# Patient Record
Sex: Female | Born: 1970
Health system: Southern US, Community
[De-identification: ages and names within clinical notes are randomized; demographics above are authoritative.]

## PROBLEM LIST (undated history)

## (undated) DIAGNOSIS — F419 Anxiety disorder, unspecified: Secondary | ICD-10-CM

## (undated) DIAGNOSIS — E785 Hyperlipidemia, unspecified: Secondary | ICD-10-CM

## (undated) DIAGNOSIS — F32A Depression, unspecified: Secondary | ICD-10-CM

## (undated) DIAGNOSIS — E079 Disorder of thyroid, unspecified: Secondary | ICD-10-CM

## (undated) DIAGNOSIS — M797 Fibromyalgia: Secondary | ICD-10-CM

## (undated) DIAGNOSIS — M5126 Other intervertebral disc displacement, lumbar region: Secondary | ICD-10-CM

## (undated) DIAGNOSIS — L409 Psoriasis, unspecified: Secondary | ICD-10-CM

## (undated) DIAGNOSIS — E039 Hypothyroidism, unspecified: Secondary | ICD-10-CM

## (undated) DIAGNOSIS — G250 Essential tremor: Secondary | ICD-10-CM

## (undated) DIAGNOSIS — Z87442 Personal history of urinary calculi: Secondary | ICD-10-CM

## (undated) DIAGNOSIS — T7840XA Allergy, unspecified, initial encounter: Secondary | ICD-10-CM

## (undated) DIAGNOSIS — E119 Type 2 diabetes mellitus without complications: Secondary | ICD-10-CM

## (undated) DIAGNOSIS — N83209 Unspecified ovarian cyst, unspecified side: Secondary | ICD-10-CM

## (undated) DIAGNOSIS — I10 Essential (primary) hypertension: Secondary | ICD-10-CM

## (undated) DIAGNOSIS — E559 Vitamin D deficiency, unspecified: Secondary | ICD-10-CM

## (undated) DIAGNOSIS — T8859XA Other complications of anesthesia, initial encounter: Secondary | ICD-10-CM

## (undated) DIAGNOSIS — F329 Major depressive disorder, single episode, unspecified: Secondary | ICD-10-CM

## (undated) HISTORY — DX: Vitamin D deficiency, unspecified: E55.9

## (undated) HISTORY — DX: Depression, unspecified: F32.A

## (undated) HISTORY — DX: Major depressive disorder, single episode, unspecified: F32.9

## (undated) HISTORY — DX: Essential tremor: G25.0

## (undated) HISTORY — DX: Essential (primary) hypertension: I10

## (undated) HISTORY — DX: Other intervertebral disc displacement, lumbar region: M51.26

## (undated) HISTORY — DX: Anxiety disorder, unspecified: F41.9

## (undated) HISTORY — DX: Hyperlipidemia, unspecified: E78.5

## (undated) HISTORY — DX: Type 2 diabetes mellitus without complications: E11.9

## (undated) HISTORY — DX: Fibromyalgia: M79.7

## (undated) HISTORY — DX: Unspecified ovarian cyst, unspecified side: N83.209

## (undated) HISTORY — DX: Allergy, unspecified, initial encounter: T78.40XA

## (undated) HISTORY — DX: Disorder of thyroid, unspecified: E07.9

## (undated) HISTORY — DX: Psoriasis, unspecified: L40.9

## (undated) HISTORY — PX: TUBAL LIGATION: SHX77

## (undated) HISTORY — PX: OTHER SURGICAL HISTORY: SHX169

---

## 2014-04-02 DIAGNOSIS — Z23 Encounter for immunization: Secondary | ICD-10-CM | POA: Diagnosis not present

## 2014-12-12 DIAGNOSIS — E039 Hypothyroidism, unspecified: Secondary | ICD-10-CM | POA: Diagnosis not present

## 2015-01-11 DIAGNOSIS — E039 Hypothyroidism, unspecified: Secondary | ICD-10-CM | POA: Diagnosis not present

## 2015-04-20 ENCOUNTER — Encounter: Payer: Self-pay | Admitting: Family Medicine

## 2015-04-20 ENCOUNTER — Ambulatory Visit (INDEPENDENT_AMBULATORY_CARE_PROVIDER_SITE_OTHER): Payer: Medicare Other | Admitting: Family Medicine

## 2015-04-20 VITALS — BP 114/82 | HR 84 | Temp 98.5°F | Resp 16 | Ht 61.5 in | Wt 163.7 lb

## 2015-04-20 DIAGNOSIS — E039 Hypothyroidism, unspecified: Secondary | ICD-10-CM | POA: Diagnosis not present

## 2015-04-20 DIAGNOSIS — Z800 Family history of malignant neoplasm of digestive organs: Secondary | ICD-10-CM

## 2015-04-20 DIAGNOSIS — R7303 Prediabetes: Secondary | ICD-10-CM | POA: Diagnosis not present

## 2015-04-20 DIAGNOSIS — R7301 Impaired fasting glucose: Secondary | ICD-10-CM | POA: Diagnosis not present

## 2015-04-20 DIAGNOSIS — E785 Hyperlipidemia, unspecified: Secondary | ICD-10-CM | POA: Insufficient documentation

## 2015-04-20 DIAGNOSIS — M797 Fibromyalgia: Secondary | ICD-10-CM | POA: Diagnosis not present

## 2015-04-20 DIAGNOSIS — L42 Pityriasis rosea: Secondary | ICD-10-CM | POA: Diagnosis not present

## 2015-04-20 DIAGNOSIS — Z23 Encounter for immunization: Secondary | ICD-10-CM | POA: Diagnosis not present

## 2015-04-20 HISTORY — DX: Encounter for immunization: Z23

## 2015-04-20 MED ORDER — LEVOTHYROXINE SODIUM 125 MCG PO TABS: 125.0000 ug | ORAL_TABLET | Freq: Every day | ORAL | Status: DC

## 2015-04-20 NOTE — Progress Notes (Signed)
Name: Tracy Stephens   MRN: [id number]    DOB: 12/01/1970   Date:04/20/2015       Progress Note  Subjective  Chief Complaint  Chief Complaint  Patient presents with  . Establish Care  . Labs Only    patient was told that she was borderline diabetic and that she had elevated cholesterol.   . Medication Refill    levothyroxine 125mcg    HPI  Tracy Stephens is a 44 y.o. female here today to transition care of medical needs to a primary care provider. She reports a history of Fibromyalgia and resting right sided tremor and LE instability which has granted her disability status at a young age. She also has hypothyroidism since her late 1820s and has a strong family propensity for this. Last TSH check was 3 months ago. Has been on Levothyroxine 125 mcg one a day for many years. Last blood work in general done 2 years ago and she was told she was pre diabetic and had high cholesterol. Implemented lifestyle changes, especially diet changes such as avoiding sugars and sodas. Not exercising routinely. Otherwise she reports plaque psoriasis for which she prefers to not take prescription medications at this time. She did however start having hypopigmented spots on her skin starting this Summer. Is pruritic.     Past Medical History  Diagnosis Date  . Fibromyalgia   . Thyroid disease   . Benign essential tremor     right side  . Herniated lumbar intervertebral disc   . Psoriasis   . Anxiety   . Depression   . Hypertension     history of blood pressure    Patient Active Problem List   Diagnosis Date Noted  . Hypothyroidism, adult 04/20/2015  . Fibromyalgia 04/20/2015  . Hyperlipidemia LDL goal <100 04/20/2015  . Pre-diabetes 04/20/2015  . Family history of colon cancer in mother 04/20/2015  . Elevated fasting glucose 04/20/2015    Social History  Substance Use Topics  . Smoking status: Never Smoker   . Smokeless tobacco: Not on file  . Alcohol Use: No     Current outpatient  prescriptions:  .  levothyroxine (SYNTHROID, LEVOTHROID) 125 MCG tablet, Take 1 tablet (125 mcg total) by mouth daily before breakfast., Disp: 90 tablet, Rfl: 1  Past Surgical History  Procedure Laterality Date  . Cesarean section      2    Family History  Problem Relation Age of Onset  . Cancer Mother     lung  . Alzheimer's disease Father   . Dementia Father   . Parkinson's disease Father   . Drug abuse Sister   . Thyroid disease Sister   . Thyroid disease Sister   . Thyroid disease Sister   . Thyroid disease Sister     Allergies  Allergen Reactions  . Lyrica [Pregabalin] Other (See Comments)    Patient reports of facial tingling     Review of Systems  CONSTITUTIONAL: No significant weight changes, fever, chills, weakness or fatigue.  HEENT:  - Eyes: No visual changes.  - Ears: No auditory changes. No pain.  - Nose: No sneezing, congestion, runny nose. - Throat: No sore throat. No changes in swallowing. SKIN: No rash or itching.  CARDIOVASCULAR: No chest pain, chest pressure or chest discomfort. No palpitations or edema.  RESPIRATORY: No shortness of breath, cough or sputum.  GASTROINTESTINAL: No anorexia, nausea, vomiting. No changes in bowel habits. No abdominal pain or blood.  GENITOURINARY: No dysuria. No frequency. No  discharge.  NEUROLOGICAL: No headache, dizziness, syncope, paralysis, ataxia, numbness or tingling in the extremities. No memory changes. No change in bowel or bladder control.  MUSCULOSKELETAL: No joint pain. Chronic muscle pain. HEMATOLOGIC: No anemia, bleeding or bruising.  LYMPHATICS: No enlarged lymph nodes.  PSYCHIATRIC: No change in mood. No change in sleep pattern.  ENDOCRINOLOGIC: No reports of sweating, cold or heat intolerance. No polyuria or polydipsia.     Objective  BP 114/82 mmHg  Pulse 84  Temp(Src) 98.5 F (36.9 C) (Oral)  Resp 16  Ht 5' 1.5" (1.562 m)  Wt 163 lb 11.2 oz (74.254 kg)  BMI 30.43 kg/m2  SpO2 97%  LMP  03/30/2015 (Approximate) Body mass index is 30.43 kg/(m^2).  Physical Exam  Constitutional: Patient is overweight and well-nourished. In no distress.  HEENT:  - Head: Normocephalic and atraumatic.  - Ears: Bilateral TMs gray, no erythema or effusion - Nose: Nasal mucosa moist - Mouth/Throat: Oropharynx is clear and moist. No tonsillar hypertrophy or erythema. No post nasal drainage.  - Eyes: Conjunctivae clear, EOM movements normal. PERRLA. No scleral icterus.  Neck: Normal range of motion. Neck supple. No JVD present. No thyromegaly present.  Cardiovascular: Normal rate, regular rhythm and normal heart sounds.  No murmur heard.  Pulmonary/Chest: Effort normal and breath sounds normal. No respiratory distress. Musculoskeletal: Normal range of motion bilateral UE and LE, no joint effusions. Peripheral vascular: Bilateral LE no edema. Neurological: CN II-XII grossly intact with no focal deficits. Alert and oriented to person, place, and time. Coordination, balance, strength, speech and gait are normal.  Skin: Skin is warm. Scaly raised plaque at scalp but not beyond front hairline, dry patches at left UE, scattered hypopigmented circular skin changes on upper body.  Psychiatric: Patient has a normal mood and affect. Behavior is normal in office today. Judgment and thought content normal in office today.   Assessment & Plan   1. Hypothyroidism, adult Lab work. Refilled levothyroxine 125 mcg one a day.  - CBC with Differential/Platelet - Comprehensive metabolic panel - Lipid panel - TSH - T3, free - T4, free - levothyroxine (SYNTHROID, LEVOTHROID) 125 MCG tablet; Take 1 tablet (125 mcg total) by mouth daily before breakfast.  Dispense: 90 tablet; Refill: 1  2. Fibromyalgia Stable.  - CBC with Differential/Platelet - Comprehensive metabolic panel - Lipid panel - TSH - T3, free - T4, free  3. Hyperlipidemia LDL goal <100 Recheck FLP. Recommend statin initiation if still  elevated.  - CBC with Differential/Platelet - Comprehensive metabolic panel - Lipid panel - TSH - T3, free - T4, free  4. Pre-diabetes Blood work.  - CBC with Differential/Platelet - Comprehensive metabolic panel - Lipid panel - TSH - T3, free - T4, free  5. Family history of colon cancer in mother Advised patient that she should have had a C-scope at age 35 at least since her mother had Colon Cancer at age 21. She will f/u for a CPE and I will order Mammogram, PAP and C-scope.  - CBC with Differential/Platelet - Comprehensive metabolic panel - Lipid panel - TSH - T3, free - T4, free  6. Need for immunization against influenza Done  - Flu Vaccine QUAD 36+ mos PF IM (Fluarix & Fluzone Quad PF)  7. Elevated fasting glucose Check for diabetes.   - Hemoglobin A1c  8. Pityriasis rosea Likely skin diagnosis. Will review blood work and suggest treatment near future.

## 2015-04-20 NOTE — Patient Instructions (Signed)
Diabetes Mellitus and Food It is important for you to manage your blood sugar (glucose) level. Your blood glucose level can be greatly affected by what you eat. Eating healthier foods in the appropriate amounts throughout the day at about the same time each day will help you control your blood glucose level. It can also help slow or prevent worsening of your diabetes mellitus. Healthy eating may even help you improve the level of your blood pressure and reach or maintain a healthy weight.  General recommendations for healthful eating and cooking habits include:  Eating meals and snacks regularly. Avoid going long periods of time without eating to lose weight.  Eating a diet that consists mainly of plant-based foods, such as fruits, vegetables, nuts, legumes, and whole grains.  Using low-heat cooking methods, such as baking, instead of high-heat cooking methods, such as deep frying. Work with your dietitian to make sure you understand how to use the Nutrition Facts information on food labels. HOW CAN FOOD AFFECT ME? Carbohydrates Carbohydrates affect your blood glucose level more than any other type of food. Your dietitian will help you determine how many carbohydrates to eat at each meal and teach you how to count carbohydrates. Counting carbohydrates is important to keep your blood glucose at a healthy level, especially if you are using insulin or taking certain medicines for diabetes mellitus. Alcohol Alcohol can cause sudden decreases in blood glucose (hypoglycemia), especially if you use insulin or take certain medicines for diabetes mellitus. Hypoglycemia can be a life-threatening condition. Symptoms of hypoglycemia (sleepiness, dizziness, and disorientation) are similar to symptoms of having too much alcohol.  If your health care provider has given you approval to drink alcohol, do so in moderation and use the following guidelines:  Women should not have more than one drink per day, and men  should not have more than two drinks per day. One drink is equal to:  12 oz of beer.  5 oz of wine.  1 oz of hard liquor.  Do not drink on an empty stomach.  Keep yourself hydrated. Have water, diet soda, or unsweetened iced tea.  Regular soda, juice, and other mixers might contain a lot of carbohydrates and should be counted. WHAT FOODS ARE NOT RECOMMENDED? As you make food choices, it is important to remember that all foods are not the same. Some foods have fewer nutrients per serving than other foods, even though they might have the same number of calories or carbohydrates. It is difficult to get your body what it needs when you eat foods with fewer nutrients. Examples of foods that you should avoid that are high in calories and carbohydrates but low in nutrients include:  Trans fats (most processed foods list trans fats on the Nutrition Facts label).  Regular soda.  Juice.  Candy.  Sweets, such as cake, pie, doughnuts, and cookies.  Fried foods. WHAT FOODS CAN I EAT? Eat nutrient-rich foods, which will nourish your body and keep you healthy. The food you should eat also will depend on several factors, including:  The calories you need.  The medicines you take.  Your weight.  Your blood glucose level.  Your blood pressure level.  Your cholesterol level. You should eat a variety of foods, including:  Protein.  Lean cuts of meat.  Proteins low in saturated fats, such as fish, egg whites, and beans. Avoid processed meats.  Fruits and vegetables.  Fruits and vegetables that may help control blood glucose levels, such as apples, mangoes, and   yams.  Dairy products.  Choose fat-free or low-fat dairy products, such as milk, yogurt, and cheese.  Grains, bread, pasta, and rice.  Choose whole grain products, such as multigrain bread, whole oats, and brown rice. These foods may help control blood pressure.  Fats.  Foods containing healthful fats, such as nuts,  avocado, olive oil, canola oil, and fish. DOES EVERYONE WITH DIABETES MELLITUS HAVE THE SAME MEAL PLAN? Because every person with diabetes mellitus is different, there is not one meal plan that works for everyone. It is very important that you meet with a dietitian who will help you create a meal plan that is just right for you.   This information is not intended to replace advice given to you by your health care provider. Make sure you discuss any questions you have with your health care provider.   Document Released: 03/07/2005 Document Revised: 07/01/2014 Document Reviewed: 05/07/2013 Elsevier Interactive Patient Education 2016 [employer]  

## 2015-11-01 ENCOUNTER — Ambulatory Visit (INDEPENDENT_AMBULATORY_CARE_PROVIDER_SITE_OTHER): Payer: Medicare Other | Admitting: Family Medicine

## 2015-11-01 ENCOUNTER — Encounter: Payer: Self-pay | Admitting: Family Medicine

## 2015-11-01 VITALS — BP 124/80 | HR 88 | Temp 98.5°F | Resp 14 | Wt 164.5 lb

## 2015-11-01 DIAGNOSIS — E039 Hypothyroidism, unspecified: Secondary | ICD-10-CM

## 2015-11-01 DIAGNOSIS — E785 Hyperlipidemia, unspecified: Secondary | ICD-10-CM | POA: Diagnosis not present

## 2015-11-01 DIAGNOSIS — R7303 Prediabetes: Secondary | ICD-10-CM

## 2015-11-01 DIAGNOSIS — R251 Tremor, unspecified: Secondary | ICD-10-CM | POA: Diagnosis not present

## 2015-11-01 DIAGNOSIS — E559 Vitamin D deficiency, unspecified: Secondary | ICD-10-CM

## 2015-11-01 DIAGNOSIS — E041 Nontoxic single thyroid nodule: Secondary | ICD-10-CM

## 2015-11-01 DIAGNOSIS — R5383 Other fatigue: Secondary | ICD-10-CM

## 2015-11-01 DIAGNOSIS — M797 Fibromyalgia: Secondary | ICD-10-CM

## 2015-11-01 HISTORY — DX: Vitamin D deficiency, unspecified: E55.9

## 2015-11-01 MED ORDER — TRAZODONE HCL 50 MG PO TABS: 25.0000 mg | ORAL_TABLET | Freq: Every evening | ORAL | Status: DC | PRN

## 2015-11-01 NOTE — Assessment & Plan Note (Signed)
Referring to neurologist

## 2015-11-01 NOTE — Progress Notes (Signed)
BP 124/80 mmHg  Pulse 88  Temp(Src) 98.5 F (36.9 C) (Oral)  Resp 14  Wt 164 lb 8 oz (74.617 kg)  SpO2 93%  LMP 10/31/2015 (Approximate)   Subjective:    Patient ID: Tracy Stephens, female    DOB: [birth year], 45 y.o.   MRN: [id number]  HPI: Tracy Stephens is a 45 y.o. female  Chief Complaint  Patient presents with  . Follow-up    6 month  . Tremors    now on left side  . Fatigue    no energy several weeks   She is on 125 mcg of levothyroxine; pretty stable over the last 10 years; fatigued; no fogginess; no constipation; no weight change; no hair loss Lab Results  Component Value Date   TSH 9.330* 11/01/2015   No hx of anemia; does have vit D deficiency  High cholesterol; borderline; has not taken medicine for that  Prediabetes; going on for about 2 years  Coughing for three days; bringing up phlegm; nonsmoker; not around smokers  Tremors; benign essential tremor; 10 years; diagnosed by neurologist; not a drinker; right hand is worst, but left hand just starting; not taking medicine; would like to see neurologist now; drinks caffeine, but has tried to only drink caffeine-free mostly; when she drinks Pepsi, not worse with caffeine, actually gets better  FIbromyalgia; cannot tolerate Cymbalta, put her sleep for 16 hours; has not tried trazodone; poor sleep quality; no snoring or sleep apnea; has bad back and sciatic nerve irritation  Depression screen [facility] 2/9 11/01/2015 04/20/2015  Decreased Interest 0 0  Down, Depressed, Hopeless 1 0  PHQ - 2 Score 1 0   Relevant past medical, surgical, family and social history reviewed Past Medical History  Diagnosis Date  . Fibromyalgia   . Thyroid disease   . Benign essential tremor     right side  . Herniated lumbar intervertebral disc   . Psoriasis   . Anxiety   . Depression   . Hypertension     history of blood pressure  . Vitamin D deficiency 11/01/2015  . Type 2 diabetes mellitus (HCC) 11/29/2015   Past Surgical  History  Procedure Laterality Date  . Cesarean section      2   Family History  Problem Relation Age of Onset  . Cancer Mother     lung  . Alzheimer's disease Father   . Dementia Father   . Parkinson's disease Father   . Drug abuse Sister   . Thyroid disease Sister   . Thyroid disease Sister   . Thyroid disease Sister   . Thyroid disease Sister    Social History  Substance Use Topics  . Smoking status: Never Smoker   . Smokeless tobacco: None  . Alcohol Use: No   Interim medical history since last visit reviewed. Allergies and medications reviewed  Review of Systems Per HPI unless specifically indicated above     Objective:    BP 124/80 mmHg  Pulse 88  Temp(Src) 98.5 F (36.9 C) (Oral)  Resp 14  Wt 164 lb 8 oz (74.617 kg)  SpO2 93%  LMP 10/31/2015 (Approximate)  Wt Readings from Last 3 Encounters:  11/29/15 162 lb (73.483 kg)  11/01/15 164 lb 8 oz (74.617 kg)  04/20/15 163 lb 11.2 oz (74.254 kg)    Physical Exam  Constitutional: She appears well-developed and well-nourished. No distress.  HENT:  Head: Normocephalic and atraumatic.  Eyes: EOM are normal. No scleral icterus.  Neck: No thyromegaly (  but there is some asymmtry, left side larger?) present.  Cardiovascular: Normal rate, regular rhythm and normal heart sounds.   No murmur heard. Pulmonary/Chest: Effort normal and breath sounds normal. No respiratory distress. She has no wheezes.  Abdominal: Soft. Bowel sounds are normal. She exhibits no distension.  Musculoskeletal: Normal range of motion. She exhibits no edema.  Neurological: She is alert. She displays tremor. She exhibits normal muscle tone.  No cogwheeling  Skin: Skin is warm and dry. She is not diaphoretic. No pallor.  Psychiatric: She has a normal mood and affect. Her behavior is normal. Judgment and thought content normal. Her mood appears not anxious. She does not exhibit a depressed mood.      Assessment & Plan:   Problem List Items  Addressed This Visit      Endocrine   Hypothyroidism, adult    Primary hypothyroidism for 17 years; very positive fam hx; check TSH and free T4 today and adjust medicine if needed      Relevant Orders   T4, free (Completed)   TSH (Completed)     Musculoskeletal and Integument   Fibromyalgia    Sleep importance reinforced; offered trazodone        Other   Hyperlipidemia LDL goal <100    Limit saturated fats; check fasting lipids today      Relevant Orders   Lipid Panel w/o Chol/HDL Ratio (Completed)   Pre-diabetes    Check A1c and glucose; limit white bread, white potatoes      Relevant Orders   Hemoglobin A1c (Completed)   Lipid Panel w/o Chol/HDL Ratio (Completed)   Tremors of nervous system - Primary    Referring to neurologist      Relevant Orders   Ambulatory referral to Neurology   Vitamin D deficiency   Relevant Orders   VITAMIN D 25 Hydroxy (Vit-D Deficiency, Fractures) (Completed)    Other Visit Diagnoses    Left thyroid nodule        Relevant Orders    T4, free (Completed)    TSH (Completed)    Other fatigue        Relevant Orders    CBC with Differential/Platelet (Completed)    Comprehensive metabolic panel (Completed)    VITAMIN D 25 Hydroxy (Vit-D Deficiency, Fractures) (Completed)    Vitamin B12 (Completed)     (thyroid ultrasound ordered at f/u visit)  Follow up plan: Return in about 4 weeks (around 11/29/2015) for follow-up.  An after-visit summary was printed and given to the patient at check-out.  Please see the patient instructions which may contain other information and recommendations beyond what is mentioned above in the assessment and plan.  Meds ordered this encounter  Medications  . DISCONTD: traZODone (DESYREL) 50 MG tablet    Sig: Take 0.5-1 tablets (25-50 mg total) by mouth at bedtime as needed for sleep.    Dispense:  30 tablet    Refill:  3   Orders Placed This Encounter  Procedures  . CBC with Differential/Platelet  .  Comprehensive metabolic panel  . Hemoglobin A1c  . Lipid Panel w/o Chol/HDL Ratio  . T4, free  . TSH  . VITAMIN D 25 Hydroxy (Vit-D Deficiency, Fractures)  . Vitamin B12  . Ambulatory referral to Neurology

## 2015-11-01 NOTE — Patient Instructions (Addendum)
We'll refer you to the neurologist We'll get labs today Return in 4 weeks for follow-up

## 2015-11-01 NOTE — Assessment & Plan Note (Signed)
Limit saturated fats; check fasting lipids today 

## 2015-11-01 NOTE — Assessment & Plan Note (Signed)
Check A1c and glucose; limit white bread, white potatoes

## 2015-11-01 NOTE — Assessment & Plan Note (Signed)
Sleep importance reinforced; offered trazodone

## 2015-11-01 NOTE — Assessment & Plan Note (Signed)
Primary hypothyroidism for 17 years; very positive fam hx; check TSH and free T4 today and adjust medicine if needed

## 2015-11-02 LAB — COMPREHENSIVE METABOLIC PANEL
A/G RATIO: 1.3 (ref 1.2–2.2)
ALBUMIN: 4.3 g/dL (ref 3.5–5.5)
ALK PHOS: 105 IU/L (ref 39–117)
ALT: 33 IU/L — ABNORMAL HIGH (ref 0–32)
AST: 40 IU/L (ref 0–40)
BILIRUBIN TOTAL: 0.3 mg/dL (ref 0.0–1.2)
BUN / CREAT RATIO: 10 (ref 9–23)
BUN: 8 mg/dL (ref 6–24)
CHLORIDE: 100 mmol/L (ref 96–106)
CO2: 21 mmol/L (ref 18–29)
Calcium: 9.8 mg/dL (ref 8.7–10.2)
Creatinine, Ser: 0.84 mg/dL (ref 0.57–1.00)
GFR calc Af Amer: 98 mL/min/{1.73_m2} (ref >59)
GFR calc non Af Amer: 85 mL/min/{1.73_m2} (ref >59)
Globulin, Total: 3.2 g/dL (ref 1.5–4.5)
Glucose: 196 mg/dL — ABNORMAL HIGH (ref 65–99)
Potassium: 4.9 mmol/L (ref 3.5–5.2)
SODIUM: 141 mmol/L (ref 134–144)
Total Protein: 7.5 g/dL (ref 6.0–8.5)

## 2015-11-02 LAB — LIPID PANEL W/O CHOL/HDL RATIO
Cholesterol, Total: 261 mg/dL — ABNORMAL HIGH (ref 100–199)
HDL: 43 mg/dL (ref >39)
LDL Calculated: 184 mg/dL — ABNORMAL HIGH (ref 0–99)
Triglycerides: 171 mg/dL — ABNORMAL HIGH (ref 0–149)
VLDL Cholesterol Cal: 34 mg/dL (ref 5–40)

## 2015-11-02 LAB — CBC WITH DIFFERENTIAL/PLATELET
BASOS ABS: 0 10*3/uL (ref 0.0–0.2)
Basos: 0 %
EOS (ABSOLUTE): 0.3 10*3/uL (ref 0.0–0.4)
Eos: 4 %
HEMATOCRIT: 43.8 % (ref 34.0–46.6)
HEMOGLOBIN: 14.9 g/dL (ref 11.1–15.9)
Immature Grans (Abs): 0 10*3/uL (ref 0.0–0.1)
Immature Granulocytes: 0 %
LYMPHS ABS: 1.1 10*3/uL (ref 0.7–3.1)
LYMPHS: 15 %
MCH: 30.9 pg (ref 26.6–33.0)
MCHC: 34 g/dL (ref 31.5–35.7)
MCV: 91 fL (ref 79–97)
Monocytes Absolute: 0.6 10*3/uL (ref 0.1–0.9)
Monocytes: 9 %
NEUTROS ABS: 5.1 10*3/uL (ref 1.4–7.0)
NEUTROS PCT: 72 %
Platelets: 343 10*3/uL (ref 150–379)
RBC: 4.82 x10E6/uL (ref 3.77–5.28)
RDW: 13 % (ref 12.3–15.4)
WBC: 7 10*3/uL (ref 3.4–10.8)

## 2015-11-02 LAB — TSH: TSH: 9.33 u[IU]/mL — AB (ref 0.450–4.500)

## 2015-11-02 LAB — HEMOGLOBIN A1C
Est. average glucose Bld gHb Est-mCnc: 206 mg/dL
HEMOGLOBIN A1C: 8.8 % — AB (ref 4.8–5.6)

## 2015-11-02 LAB — T4, FREE: FREE T4: 1.22 ng/dL (ref 0.82–1.77)

## 2015-11-02 LAB — VITAMIN D 25 HYDROXY (VIT D DEFICIENCY, FRACTURES): Vit D, 25-Hydroxy: 26.3 ng/mL — ABNORMAL LOW (ref 30.0–100.0)

## 2015-11-02 LAB — VITAMIN B12: VITAMIN B 12: 436 pg/mL (ref 211–946)

## 2015-11-07 ENCOUNTER — Telehealth: Payer: Self-pay

## 2015-11-07 NOTE — Telephone Encounter (Signed)
Patient called please review labs 

## 2015-11-09 MED ORDER — METFORMIN HCL ER 500 MG PO TB24: ORAL_TABLET | ORAL | Status: DC

## 2015-11-09 MED ORDER — LEVOTHYROXINE SODIUM 137 MCG PO TABS: 137.0000 ug | ORAL_TABLET | Freq: Every day | ORAL | Status: DC

## 2015-11-09 MED ORDER — TRAZODONE HCL 50 MG PO TABS: 50.0000 mg | ORAL_TABLET | Freq: Every evening | ORAL | Status: DC | PRN

## 2015-11-09 NOTE — Telephone Encounter (Signed)
I talked with patient about her labs earlier today A1C does in fact show diabetes; start medicine She has already done diabetic teaching she says Cholesterol too high; we'll address that at appt TSH shows dose needs adjusting; new Rx sent Start vit D 2000 iu; take 2 a day for 2 months, then one a day Appt in next 2-3 weeks about her diabetes She agrees

## 2015-11-09 NOTE — Telephone Encounter (Signed)
I spoke with patient; I'm in between patients; will document soon

## 2015-11-29 ENCOUNTER — Encounter: Payer: Self-pay | Admitting: Family Medicine

## 2015-11-29 ENCOUNTER — Ambulatory Visit (INDEPENDENT_AMBULATORY_CARE_PROVIDER_SITE_OTHER): Payer: Medicare Other | Admitting: Family Medicine

## 2015-11-29 VITALS — BP 122/74 | HR 80 | Temp 97.7°F | Resp 14 | Wt 162.0 lb

## 2015-11-29 DIAGNOSIS — E119 Type 2 diabetes mellitus without complications: Secondary | ICD-10-CM

## 2015-11-29 DIAGNOSIS — E039 Hypothyroidism, unspecified: Secondary | ICD-10-CM

## 2015-11-29 DIAGNOSIS — R251 Tremor, unspecified: Secondary | ICD-10-CM

## 2015-11-29 DIAGNOSIS — E118 Type 2 diabetes mellitus with unspecified complications: Secondary | ICD-10-CM | POA: Insufficient documentation

## 2015-11-29 DIAGNOSIS — E559 Vitamin D deficiency, unspecified: Secondary | ICD-10-CM

## 2015-11-29 DIAGNOSIS — E1165 Type 2 diabetes mellitus with hyperglycemia: Secondary | ICD-10-CM

## 2015-11-29 DIAGNOSIS — E785 Hyperlipidemia, unspecified: Secondary | ICD-10-CM

## 2015-11-29 DIAGNOSIS — E041 Nontoxic single thyroid nodule: Secondary | ICD-10-CM | POA: Diagnosis not present

## 2015-11-29 HISTORY — DX: Type 2 diabetes mellitus without complications: E11.9

## 2015-11-29 MED ORDER — TRAZODONE HCL 150 MG PO TABS
150.0000 mg | ORAL_TABLET | Freq: Every day | ORAL | Status: DC
Start: 2015-11-29 — End: 2016-07-08

## 2015-11-29 NOTE — Assessment & Plan Note (Signed)
Order thyroid US 

## 2015-11-29 NOTE — Assessment & Plan Note (Signed)
Going to neurologist end of July

## 2015-11-29 NOTE — Assessment & Plan Note (Signed)
On medicine, recheck TSH at f/u in August

## 2015-11-29 NOTE — Assessment & Plan Note (Signed)
Taking supplement.

## 2015-11-29 NOTE — Patient Instructions (Addendum)
Start vit D 2000 iu; take 2 a day for 2 months, then one a day  Please do see your eye doctor regularly, and have your eyes examined every year (or more often per his or her recommendation) Check your feet every night and let me know right away of any sores, infections, numbness, etc. Try to limit sweets, white bread, white rice, white potatoes It is okay with me for you to not check your fingerstick blood sugars (per [employer] Best Practices), unless you are interested and feel it would be helpful for you  Try to limit saturated fats in your diet (bologna, hot dogs, barbeque, cheeseburgers, hamburgers, steak, bacon, sausage, cheese, etc.) and get more fresh fruits, vegetables, and whole grains  I am so proud of the changes you have made -- keep up the good work!  I've ordered the ultrasound, so please call if you've not heard about the appointment in one week

## 2015-11-29 NOTE — Assessment & Plan Note (Addendum)
Foot exam today by MD; next A1c due on or after August 11th; already did the diabetic teaching

## 2015-11-29 NOTE — Progress Notes (Signed)
BP 122/74 mmHg  Pulse 80  Temp(Src) 97.7 F (36.5 C) (Oral)  Resp 14  Wt 162 lb (73.483 kg)  SpO2 97%  LMP 10/31/2015 (Approximate)   Subjective:    Patient ID: Tracy Stephens, female    DOB: [birth year], 45 y.o.   MRN: [id number]  HPI: Tracy Stephens is a 45 y.o. female  Chief Complaint  Patient presents with  . Medication Refill   Thyroid nodule, left; abnormal TSH and adjusted medicine  High cholesterol; daughter has high chol too but not on medicine, borderline We reviewed her lipids; she has changed her diet; smaller portions, drinking only water; adjusting her diet; no eggs; no milk; changed from white to wheat bread; she wants to see how she does without medicine first  Vitamin D deficiency; on the supplement 4,000 iu daily for now  She is taking the sleeping medicine, 3 at a time is working well for sleep; not hungover in the morning  Diabetes; gotten rid of white bread, no more sodas; lost 2 pounds; always moving  Depression screen [facility] 2/9 11/01/2015 04/20/2015  Decreased Interest 0 0  Down, Depressed, Hopeless 1 0  PHQ - 2 Score 1 0   Relevant past medical, surgical, family and social history reviewed Past Medical History  Diagnosis Date  . Fibromyalgia   . Thyroid disease   . Benign essential tremor     right side  . Herniated lumbar intervertebral disc   . Psoriasis   . Anxiety   . Depression   . Hypertension     history of blood pressure  . Vitamin D deficiency 11/01/2015  . Type 2 diabetes mellitus (HCC) 11/29/2015   Past Surgical History  Procedure Laterality Date  . Cesarean section      2   Family History  Problem Relation Age of Onset  . Cancer Mother     lung  . Alzheimer's disease Father   . Dementia Father   . Parkinson's disease Father   . Drug abuse Sister   . Thyroid disease Sister   . Thyroid disease Sister   . Thyroid disease Sister   . Thyroid disease Sister    Social History  Substance Use Topics  . Smoking status:  Never Smoker   . Smokeless tobacco: None  . Alcohol Use: No   Interim medical history since last visit reviewed. Allergies and medications reviewed  Review of Systems Per HPI unless specifically indicated above     Objective:    BP 122/74 mmHg  Pulse 80  Temp(Src) 97.7 F (36.5 C) (Oral)  Resp 14  Wt 162 lb (73.483 kg)  SpO2 97%  LMP 10/31/2015 (Approximate)  Wt Readings from Last 3 Encounters:  11/29/15 162 lb (73.483 kg)  11/01/15 164 lb 8 oz (74.617 kg)  04/20/15 163 lb 11.2 oz (74.254 kg)   body mass index is 30.12 kg/(m^2).  Physical Exam  Constitutional: She appears well-developed and well-nourished. No distress.  Obese  Eyes: EOM are normal. No scleral icterus.  Neck: No thyromegaly (but there is some asymmtry, left side larger?) present.  Cardiovascular: Normal rate, regular rhythm and normal heart sounds.   No murmur heard. Pulmonary/Chest: Effort normal and breath sounds normal. No respiratory distress. She has no wheezes.  Abdominal: Soft. Bowel sounds are normal. She exhibits no distension.  Musculoskeletal: Normal range of motion. She exhibits no edema.  Neurological: She is alert. She displays tremor. She exhibits normal muscle tone.  No cogwheeling  Skin: Skin is warm  and dry. She is not diaphoretic. No pallor.  Psychiatric: She has a normal mood and affect. Her behavior is normal. Judgment and thought content normal. Her mood appears not anxious. She does not exhibit a depressed mood.   Diabetic Foot Form - Detailed   Diabetic Foot Exam - detailed  Diabetic Foot exam was performed with the following findings:  Yes 11/29/2015  1:14 PM  Visual Foot Exam completed.:  Yes  Are the toenails ingrown?:  No  Normal Range of Motion:  Yes    Pulse Foot Exam completed.:  Yes  Right Dorsalis Pedis:  Present Left Dorsalis Pedis:  Present  Sensory Foot Exam Completed.:  Yes  Swelling:  No  Semmes-Weinstein Monofilament Test  R Site 1-Great Toe:  Pos L Site  1-Great Toe:  Pos  R Site 4:  Pos L Site 4:  Pos  R Site 5:  Pos L Site 5:  Pos        Results for orders placed or performed in visit on 11/01/15  CBC with Differential/Platelet  Result Value Ref Range   WBC 7.0 3.4 - 10.8 x10E3/uL   RBC 4.82 3.77 - 5.28 x10E6/uL   Hemoglobin 14.9 11.1 - 15.9 g/dL   Hematocrit 16.143.8 09.634.0 - 46.6 %   MCV 91 79 - 97 fL   MCH 30.9 26.6 - 33.0 pg   MCHC 34.0 31.5 - 35.7 g/dL   RDW 04.513.0 40.912.3 - 81.115.4 %   Platelets 343 150 - 379 x10E3/uL   Neutrophils 72 %   Lymphs 15 %   Monocytes 9 %   Eos 4 %   Basos 0 %   Neutrophils Absolute 5.1 1.4 - 7.0 x10E3/uL   Lymphocytes Absolute 1.1 0.7 - 3.1 x10E3/uL   Monocytes Absolute 0.6 0.1 - 0.9 x10E3/uL   EOS (ABSOLUTE) 0.3 0.0 - 0.4 x10E3/uL   Basophils Absolute 0.0 0.0 - 0.2 x10E3/uL   Immature Granulocytes 0 %   Immature Grans (Abs) 0.0 0.0 - 0.1 x10E3/uL  Comprehensive metabolic panel  Result Value Ref Range   Glucose 196 (H) 65 - 99 mg/dL   BUN 8 6 - 24 mg/dL   Creatinine, Ser 9.140.84 0.57 - 1.00 mg/dL   GFR calc non Af Amer 85 >59 mL/min/1.73   GFR calc Af Amer 98 >59 mL/min/1.73   BUN/Creatinine Ratio 10 9 - 23   Sodium 141 134 - 144 mmol/L   Potassium 4.9 3.5 - 5.2 mmol/L   Chloride 100 96 - 106 mmol/L   CO2 21 18 - 29 mmol/L   Calcium 9.8 8.7 - 10.2 mg/dL   Total Protein 7.5 6.0 - 8.5 g/dL   Albumin 4.3 3.5 - 5.5 g/dL   Globulin, Total 3.2 1.5 - 4.5 g/dL   Albumin/Globulin Ratio 1.3 1.2 - 2.2   Bilirubin Total 0.3 0.0 - 1.2 mg/dL   Alkaline Phosphatase 105 39 - 117 IU/L   AST 40 0 - 40 IU/L   ALT 33 (H) 0 - 32 IU/L  Hemoglobin A1c  Result Value Ref Range   Hgb A1c MFr Bld 8.8 (H) 4.8 - 5.6 %   Est. average glucose Bld gHb Est-mCnc 206 mg/dL  Lipid Panel w/o Chol/HDL Ratio  Result Value Ref Range   Cholesterol, Total 261 (H) 100 - 199 mg/dL   Triglycerides 782171 (H) 0 - 149 mg/dL   HDL 43 >95>39 mg/dL   VLDL Cholesterol Cal 34 5 - 40 mg/dL   LDL Calculated 621184 (H) 0 - 99  mg/dL  T4, free    Result Value Ref Range   Free T4 1.22 0.82 - 1.77 ng/dL  TSH  Result Value Ref Range   TSH 9.330 (H) 0.450 - 4.500 uIU/mL  VITAMIN D 25 Hydroxy (Vit-D Deficiency, Fractures)  Result Value Ref Range   Vit D, 25-Hydroxy 26.3 (L) 30.0 - 100.0 ng/mL  Vitamin B12  Result Value Ref Range   Vitamin B-12 436 211 - 946 pg/mL      Assessment & Plan:   Problem List Items Addressed This Visit      Endocrine   Type 2 diabetes mellitus (HCC)    Foot exam today by MD; next A1c due on or after August 11th; already did the diabetic teaching      Thyroid nodule - Primary    Order thyroid US      Relevant Medications   levothyroxine (SYNTHROID, LEVOTHROID) 125 MCG tablet   Other Relevant Orders   US THYROID (Completed)   Hypothyroidism, adult    On medicine, recheck TSH at f/u in August      Relevant Medications   levothyroxine (SYNTHROID, LEVOTHROID) 125 MCG tablet     Other   Vitamin D deficiency    Taking supplement      Tremors of nervous system    Going to neurologist end of July      Hyperlipidemia LDL goal <100    So proud of her for making big changes in her diet; we'll recheck fasting lipids in another 8 weeks and see if medicine needed at that point; limiting saturated fats, no eggs, no milk         Follow up plan: Return in about 2 months (around 02/12/2016) for fasting labs and visit.  An after-visit summary was printed and given to the patient at check-out.  Please see the patient instructions which may contain other information and recommendations beyond what is mentioned above in the assessment and plan.  Meds ordered this encounter  Medications  . levothyroxine (SYNTHROID, LEVOTHROID) 125 MCG tablet    Sig:   . cholecalciferol (VITAMIN D) 1000 units tablet    Sig: Take 2,000 Units by mouth 2 (two) times daily.  . traZODone (DESYREL) 150 MG tablet    Sig: Take 1 tablet (150 mg total) by mouth at bedtime.    Dispense:  30 tablet    Refill:  5    Orders  Placed This Encounter  Procedures  . US THYROID

## 2015-11-29 NOTE — Assessment & Plan Note (Signed)
So proud of her for making big changes in her diet; we'll recheck fasting lipids in another 8 weeks and see if medicine needed at that point; limiting saturated fats, no eggs, no milk

## 2015-12-04 ENCOUNTER — Ambulatory Visit
Admission: RE | Admit: 2015-12-04 | Discharge: 2015-12-04 | Disposition: A | Discharge status: Home / Self Care | Payer: Medicare Other | Source: Ambulatory Visit | Attending: Family Medicine | Admitting: Family Medicine

## 2015-12-04 DIAGNOSIS — E041 Nontoxic single thyroid nodule: Secondary | ICD-10-CM | POA: Diagnosis not present

## 2015-12-04 DIAGNOSIS — Z0389 Encounter for observation for other suspected diseases and conditions ruled out: Secondary | ICD-10-CM | POA: Diagnosis not present

## 2016-01-13 ENCOUNTER — Other Ambulatory Visit: Payer: Self-pay | Admitting: Family Medicine

## 2016-01-13 NOTE — Telephone Encounter (Signed)
It looks like the wrong dose of thyroid med was put back in the med list in June; her dose was increased; I'm refilling the 137 strength and she'll get TSH rechecked in August

## 2016-01-22 DIAGNOSIS — G250 Essential tremor: Secondary | ICD-10-CM | POA: Diagnosis not present

## 2016-02-12 ENCOUNTER — Ambulatory Visit (INDEPENDENT_AMBULATORY_CARE_PROVIDER_SITE_OTHER): Payer: Medicare Other | Admitting: Family Medicine

## 2016-02-12 ENCOUNTER — Encounter: Payer: Self-pay | Admitting: Family Medicine

## 2016-02-12 DIAGNOSIS — Z5181 Encounter for therapeutic drug level monitoring: Secondary | ICD-10-CM | POA: Insufficient documentation

## 2016-02-12 DIAGNOSIS — E039 Hypothyroidism, unspecified: Secondary | ICD-10-CM | POA: Diagnosis not present

## 2016-02-12 DIAGNOSIS — E1165 Type 2 diabetes mellitus with hyperglycemia: Secondary | ICD-10-CM | POA: Diagnosis not present

## 2016-02-12 DIAGNOSIS — E785 Hyperlipidemia, unspecified: Secondary | ICD-10-CM

## 2016-02-12 DIAGNOSIS — E559 Vitamin D deficiency, unspecified: Secondary | ICD-10-CM | POA: Diagnosis not present

## 2016-02-12 DIAGNOSIS — L409 Psoriasis, unspecified: Secondary | ICD-10-CM | POA: Diagnosis not present

## 2016-02-12 DIAGNOSIS — G4700 Insomnia, unspecified: Secondary | ICD-10-CM | POA: Diagnosis not present

## 2016-02-12 HISTORY — DX: Encounter for therapeutic drug level monitoring: Z51.81

## 2016-02-12 LAB — COMPREHENSIVE METABOLIC PANEL
ALK PHOS: 61 U/L (ref 33–115)
ALT: 35 U/L — AB (ref 6–29)
AST: 31 U/L — AB (ref 10–30)
Albumin: 4.2 g/dL (ref 3.6–5.1)
BILIRUBIN TOTAL: 0.4 mg/dL (ref 0.2–1.2)
BUN: 10 mg/dL (ref 7–25)
CALCIUM: 9.5 mg/dL (ref 8.6–10.2)
CO2: 25 mmol/L (ref 20–31)
CREATININE: 0.85 mg/dL (ref 0.50–1.10)
Chloride: 100 mmol/L (ref 98–110)
GLUCOSE: 145 mg/dL — AB (ref 65–99)
Potassium: 4.6 mmol/L (ref 3.5–5.3)
SODIUM: 134 mmol/L — AB (ref 135–146)
Total Protein: 6.8 g/dL (ref 6.1–8.1)

## 2016-02-12 LAB — LIPID PANEL
CHOLESTEROL: 230 mg/dL — AB (ref 125–200)
HDL: 46 mg/dL (ref >=46)
LDL Cholesterol: 157 mg/dL — ABNORMAL HIGH (ref <130)
Total CHOL/HDL Ratio: 5 Ratio (ref <=5.0)
Triglycerides: 134 mg/dL (ref <150)
VLDL: 27 mg/dL (ref <30)

## 2016-02-12 LAB — TSH: TSH: 0.08 mIU/L — ABNORMAL LOW

## 2016-02-12 NOTE — Assessment & Plan Note (Signed)
Take 1000 iu vit D daily

## 2016-02-12 NOTE — Assessment & Plan Note (Signed)
Try trazodone one hour before bed, no electronics for two hours before bed

## 2016-02-12 NOTE — Progress Notes (Signed)
BP 122/84   Pulse 77   Temp 97.5 F (36.4 C) (Oral)   Resp 14   Wt 156 lb (70.8 kg)   LMP 01/29/2016   SpO2 96%   BMI 29.00 kg/m    Subjective:    Patient ID: Tracy Stephens [place]kle, female    DOB: 08/01/1970, 45 y.o.   MRN: [id number]  HPI: Tracy Stephens Zumstein is a 45 y.o. female  Chief Complaint  Patient presents with  . Follow-up    2 months   She is doing well since last visit Working on weight loss; cut out soda; wheat bread instead of white  Type 2 diabetes; not checking sugars; dry mouth upon awakening; no nocturia; not having urinary frequency; avoiding white bread; limits donuts and sweets; cut out regular sodas; drinks one glass of V8 fusion Lab Results  Component Value Date   HGBA1C 8.8 (H) 11/01/2015   HTN; well-controlled; not using much salt  Thyroid dysfunction; very strong fam hx of thyroid disease; no hair loss Lab Results  Component Value Date   TSH 9.330 (H) 11/01/2015   High cholesterol; not eating much saturated fats Lab Results  Component Value Date   CHOL 261 (H) 11/01/2015   Lab Results  Component Value Date   HDL 43 11/01/2015   Lab Results  Component Value Date   LDLCALC 184 (H) 11/01/2015   Lab Results  Component Value Date   TRIG 171 (H) 11/01/2015   No results found for: CHOLHDL No results found for: LDLDIRECT  Psoriasis; would not like to see dermatologist; knows about tar shampoo; along hair line  Insomnia; using trazodone; snores some; goes to bed late around midnight; no caffeine or green tea  Vit D deficiency; was taking 2000 iu daily; will switch to 1000 iu now  [facility] 02[facility]12/2016 11/01/2015 04/20/2015  Decreased Interest 0 0 0  Down, Depressed, Hopeless 1 1 0  PHQ - 2 Score 1 1 0   Relevant past medical, surgical, family and social history reviewed Past Medical History:  Diagnosis Date  . Anxiety   . Benign essential tremor    right side  . Depression   . Fibromyalgia   . Herniated lumbar  intervertebral disc   . Hypertension    history of blood pressure  . Psoriasis   . Thyroid disease   . Type 2 diabetes mellitus (HCC) 11/29/2015  . Vitamin D deficiency 11/01/2015   Past Surgical History:  Procedure Laterality Date  . CESAREAN SECTION     2   Family History  Problem Relation Age of Onset  . Cancer Mother     lung  . Alzheimer's disease Father   . Dementia Father   . Parkinson's disease Father   . Drug abuse Sister   . Thyroid disease Sister   . Thyroid disease Sister   . Thyroid disease Sister   . Thyroid disease Sister    Social History  Substance Use Topics  . Smoking status: Never Smoker  . Smokeless tobacco: Not on file  . Alcohol use No    Interim medical history since last visit reviewed. Allergies and medications reviewed  Review of Systems Per HPI unless specifically indicated above     Objective:    BP 122/84   Pulse 77   Temp 97.5 F (36.4 C) (Oral)   Resp 14   Wt 156 lb (70.8 kg)   LMP 01/29/2016   SpO2 96%   BMI 29.00 kg/m   Wt Readings  from Last 3 Encounters:  02/12/16 156 lb (70.8 kg)  11/29/15 162 lb (73.5 kg)  11/01/15 164 lb 8 oz (74.6 kg)    Physical Exam  Constitutional: She appears well-developed and well-nourished. No distress.  HENT:  Head: Normocephalic and atraumatic.  Eyes: EOM are normal. No scleral icterus.  Neck: No JVD present.  Cardiovascular: Normal rate.   Pulmonary/Chest: Effort normal.  Abdominal: Soft. She exhibits no distension.  Musculoskeletal: She exhibits no edema.  Neurological: She is alert.  Skin: Skin is warm and dry. She is not diaphoretic. No pallor.  Psychiatric: She has a normal mood and affect. Her behavior is normal. Judgment and thought content normal.   Diabetic Foot Form - Detailed   Diabetic Foot Exam - detailed Diabetic Foot exam was performed with the following findings:  Yes 02/12/2016 10:52 AM  Visual Foot Exam completed.:  Yes  Are the toenails ingrown?:  No Normal Range  of Motion:  Yes Pulse Foot Exam completed.:  Yes  Right Dorsalis Pedis:  Present Left Dorsalis Pedis:  Present  Sensory Foot Exam Completed.:  Yes Swelling:  No Semmes-Weinstein Monofilament Test R Site 1-Great Toe:  Pos L Site 1-Great Toe:  Pos  R Site 4:  Pos L Site 4:  Pos  R Site 5:  Pos L Site 5:  Pos        Results for orders placed or performed in visit on 11/01/15  CBC with Differential/Platelet  Result Value Ref Range   WBC 7.0 3.4 - 10.8 x10E3/uL   RBC 4.82 3.77 - 5.28 x10E6/uL   Hemoglobin 14.9 11.1 - 15.9 g/dL   Hematocrit 16.143.8 09.634.0 - 46.6 %   MCV 91 79 - 97 fL   MCH 30.9 26.6 - 33.0 pg   MCHC 34.0 31.5 - 35.7 g/dL   RDW 04.513.0 40.912.3 - 81.115.4 %   Platelets 343 150 - 379 x10E3/uL   Neutrophils 72 %   Lymphs 15 %   Monocytes 9 %   Eos 4 %   Basos 0 %   Neutrophils Absolute 5.1 1.4 - 7.0 x10E3/uL   Lymphocytes Absolute 1.1 0.7 - 3.1 x10E3/uL   Monocytes Absolute 0.6 0.1 - 0.9 x10E3/uL   EOS (ABSOLUTE) 0.3 0.0 - 0.4 x10E3/uL   Basophils Absolute 0.0 0.0 - 0.2 x10E3/uL   Immature Granulocytes 0 %   Immature Grans (Abs) 0.0 0.0 - 0.1 x10E3/uL  Comprehensive metabolic panel  Result Value Ref Range   Glucose 196 (H) 65 - 99 mg/dL   BUN 8 6 - 24 mg/dL   Creatinine, Ser 9.140.84 0.57 - 1.00 mg/dL   GFR calc non Af Amer 85 >59 mL/min/1.73   GFR calc Af Amer 98 >59 mL/min/1.73   BUN/Creatinine Ratio 10 9 - 23   Sodium 141 134 - 144 mmol/L   Potassium 4.9 3.5 - 5.2 mmol/L   Chloride 100 96 - 106 mmol/L   CO2 21 18 - 29 mmol/L   Calcium 9.8 8.7 - 10.2 mg/dL   Total Protein 7.5 6.0 - 8.5 g/dL   Albumin 4.3 3.5 - 5.5 g/dL   Globulin, Total 3.2 1.5 - 4.5 g/dL   Albumin/Globulin Ratio 1.3 1.2 - 2.2   Bilirubin Total 0.3 0.0 - 1.2 mg/dL   Alkaline Phosphatase 105 39 - 117 IU/L   AST 40 0 - 40 IU/L   ALT 33 (H) 0 - 32 IU/L  Hemoglobin A1c  Result Value Ref Range   Hgb A1c MFr Bld 8.8 (H)  4.8 - 5.6 %   Est. average glucose Bld gHb Est-mCnc 206 mg/dL  Lipid Panel w/o  Chol/HDL Ratio  Result Value Ref Range   Cholesterol, Total 261 (H) 100 - 199 mg/dL   Triglycerides 161 (H) 0 - 149 mg/dL   HDL 43 >09 mg/dL   VLDL Cholesterol Cal 34 5 - 40 mg/dL   LDL Calculated 604 (H) 0 - 99 mg/dL  T4, free  Result Value Ref Range   Free T4 1.22 0.82 - 1.77 ng/dL  TSH  Result Value Ref Range   TSH 9.330 (H) 0.450 - 4.500 uIU/mL  VITAMIN D 25 Hydroxy (Vit-D Deficiency, Fractures)  Result Value Ref Range   Vit D, 25-Hydroxy 26.3 (L) 30.0 - 100.0 ng/mL  Vitamin B12  Result Value Ref Range   Vitamin B-12 436 211 - 946 pg/mL      Assessment & Plan:   Problem List Items Addressed This Visit      Endocrine   Type 2 diabetes mellitus (HCC)    Foot exam done, encouraged nightly foot checks; eye exams yearly, refer to eye doctor      Relevant Orders   Ambulatory referral to Optometry   Hemoglobin A1c   Microalbumin / creatinine urine ratio   Hypothyroidism, adult    Check TSH today      Relevant Orders   TSH     Musculoskeletal and Integument   Psoriasis    Patient declined referral dermatologist at this time        Other   Vitamin D deficiency    Take 1000 iu vit D daily      Insomnia    Try trazodone one hour before bed, no electronics for two hours before bed      Hyperlipidemia LDL goal <100    Check lipids today; consider statin      Relevant Orders   Lipid panel   Encounter for medication monitoring    Check labs      Relevant Orders   Comprehensive metabolic panel    Other Visit Diagnoses   None.      Follow up plan: Return in about 3 months (around 05/14/2016) for follow-up and fasting labs.  An after-visit summary was printed and given to the patient at check-out.  Please see the patient instructions which may contain other information and recommendations beyond what is mentioned above in the assessment and plan.  Meds ordered this encounter  Medications  . gabapentin (NEURONTIN) 100 MG capsule    Sig: Take 100 mg  by mouth daily.    Orders Placed This Encounter  Procedures  . TSH  . Comprehensive metabolic panel  . Hemoglobin A1c  . Lipid panel  . Microalbumin / creatinine urine ratio  . Ambulatory referral to Optometry

## 2016-02-12 NOTE — Assessment & Plan Note (Signed)
Check TSH today

## 2016-02-12 NOTE — Patient Instructions (Addendum)
Take the trazodone one hour before bedtime We'll get labs today If you have not heard anything from my staff in a week about any orders/referrals/studies from today, please contact us here to follow-up [phone]  Please do see your eye doctor regularly, and have your eyes examined every year (or more often per his or her recommendation) I've put in a referral for the eye doctor Check your feet every night and let me know right away of any sores, infections, numbness, etc. Try to limit sweets, white bread, white rice, white potatoes  Check out the information at familydoctor.org entitled "Nutrition for Weight Loss: What You Need to Know about Fad Diets" Try to lose between 1-2 pounds per week by taking in fewer calories and burning off more calories You can succeed by limiting portions, limiting foods dense in calories and fat, becoming more active, and drinking 8 glasses of water a day (64 ounces) Don't skip meals, especially breakfast, as skipping meals may alter your metabolism Do not use over-the-counter weight loss pills or gimmicks that claim rapid weight loss A healthy BMI (or body mass index) is between 18.5 and 24.9 You can calculate your ideal BMI at the NIH website [employer].huhttp://www.nhlbi.nih.gov/health/educational/lose_wt/BMI/bmicalc.htm  Try to limit saturated fats in your diet (bologna, hot dogs, barbeque, cheeseburgers, hamburgers, steak, bacon, sausage, cheese, etc.) and get more fresh fruits, vegetables, and whole grains

## 2016-02-12 NOTE — Assessment & Plan Note (Signed)
Check lipids today; consider statin

## 2016-02-12 NOTE — Assessment & Plan Note (Signed)
Patient declined referral dermatologist at this time

## 2016-02-12 NOTE — Assessment & Plan Note (Signed)
Foot exam done, encouraged nightly foot checks; eye exams yearly, refer to eye doctor

## 2016-02-12 NOTE — Assessment & Plan Note (Signed)
Check labs 

## 2016-02-13 ENCOUNTER — Other Ambulatory Visit: Payer: Self-pay | Admitting: Family Medicine

## 2016-02-13 DIAGNOSIS — E039 Hypothyroidism, unspecified: Secondary | ICD-10-CM

## 2016-02-13 LAB — MICROALBUMIN / CREATININE URINE RATIO: CREATININE, URINE: 25 mg/dL (ref 20–320)

## 2016-02-13 LAB — HEMOGLOBIN A1C
Hgb A1c MFr Bld: 6.9 % — ABNORMAL HIGH (ref <5.7)
Mean Plasma Glucose: 151 mg/dL

## 2016-02-13 MED ORDER — LEVOTHYROXINE SODIUM 137 MCG PO TABS: ORAL_TABLET | ORAL | 1 refills | Status: DC

## 2016-02-13 MED ORDER — LEVOTHYROXINE SODIUM 125 MCG PO TABS: ORAL_TABLET | ORAL | 1 refills | Status: DC

## 2016-02-13 NOTE — Progress Notes (Signed)
Changing thyroid dose; recheck TSH in 6 weeks

## 2016-02-13 NOTE — Assessment & Plan Note (Signed)
Check TSH in 6 weeks after dose adjustment

## 2016-02-21 ENCOUNTER — Telehealth: Payer: Self-pay

## 2016-02-21 DIAGNOSIS — Z5181 Encounter for therapeutic drug level monitoring: Secondary | ICD-10-CM

## 2016-02-21 DIAGNOSIS — E785 Hyperlipidemia, unspecified: Secondary | ICD-10-CM

## 2016-02-21 NOTE — Telephone Encounter (Signed)
Patient called back about labs, is willing to start chol med also would like to go ahead and get rx for glucose meter

## 2016-02-28 MED ORDER — ACCU-CHEK FASTCLIX LANCETS MISC: 1 refills | Status: DC

## 2016-02-28 MED ORDER — ACCU-CHEK FASTCLIX LANCET KIT: PACK | 0 refills | Status: DC

## 2016-02-28 MED ORDER — ATORVASTATIN CALCIUM 20 MG PO TABS: 20.0000 mg | ORAL_TABLET | Freq: Every day | ORAL | 1 refills | Status: DC

## 2016-02-28 MED ORDER — ACCU-CHEK AVIVA PLUS W/DEVICE KIT: PACK | 0 refills | Status: DC

## 2016-02-28 MED ORDER — GLUCOSE BLOOD VI STRP: ORAL_STRIP | 1 refills | Status: DC

## 2016-02-28 NOTE — Assessment & Plan Note (Signed)
Start statin; check lipids and sgpt in 6 weeks

## 2016-02-28 NOTE — Assessment & Plan Note (Signed)
Check sgpt in 6 weeks 

## 2016-02-28 NOTE — Telephone Encounter (Signed)
I talked with patient; strips, lancets, etc sent She has not picked up new thyroid med yet; please get that asap; she was getting too much medicine (orders already in chart) She agrees to start statin; recheck lipids and sgpt in 6 weeks (new orders entered)

## 2016-03-04 DIAGNOSIS — G252 Other specified forms of tremor: Secondary | ICD-10-CM | POA: Insufficient documentation

## 2016-03-04 HISTORY — DX: Other specified forms of tremor: G25.2

## 2016-03-13 DIAGNOSIS — E119 Type 2 diabetes mellitus without complications: Secondary | ICD-10-CM | POA: Diagnosis not present

## 2016-03-13 LAB — HM DIABETES EYE EXAM

## 2016-04-22 ENCOUNTER — Other Ambulatory Visit: Payer: Self-pay

## 2016-04-22 NOTE — Telephone Encounter (Signed)
Ins requesting 90 day 

## 2016-04-23 NOTE — Telephone Encounter (Signed)
Patient states has enough till her appt will do bloodwork then.

## 2016-04-23 NOTE — Telephone Encounter (Signed)
Please ask pt to have fasting labs done today (she can have breakfast, skip lunch, and come in after 6-8 hour fast) I need those results to refill her meds; thank you

## 2016-05-04 ENCOUNTER — Other Ambulatory Visit: Payer: Self-pay | Admitting: Family Medicine

## 2016-05-06 NOTE — Telephone Encounter (Signed)
Has an appt Thursday will get blood work then

## 2016-05-06 NOTE — Telephone Encounter (Signed)
Please call patient She was due to get her thyroid rechecked in early October, and was due to recheck her cholesterol and liver function in mid-October Please ask her to come by for labs in the next day or two I'll send limited Rx so she doesn't run out, but we need to make sure these are the right doses and not causing harm Thank you

## 2016-05-09 ENCOUNTER — Ambulatory Visit (INDEPENDENT_AMBULATORY_CARE_PROVIDER_SITE_OTHER): Payer: Medicare Other | Admitting: Family Medicine

## 2016-05-09 ENCOUNTER — Encounter: Payer: Self-pay | Admitting: Family Medicine

## 2016-05-09 DIAGNOSIS — E039 Hypothyroidism, unspecified: Secondary | ICD-10-CM

## 2016-05-09 DIAGNOSIS — E785 Hyperlipidemia, unspecified: Secondary | ICD-10-CM | POA: Diagnosis not present

## 2016-05-09 DIAGNOSIS — E1165 Type 2 diabetes mellitus with hyperglycemia: Secondary | ICD-10-CM

## 2016-05-09 DIAGNOSIS — Z5181 Encounter for therapeutic drug level monitoring: Secondary | ICD-10-CM

## 2016-05-09 DIAGNOSIS — N926 Irregular menstruation, unspecified: Secondary | ICD-10-CM

## 2016-05-09 DIAGNOSIS — Z114 Encounter for screening for human immunodeficiency virus [HIV]: Secondary | ICD-10-CM | POA: Diagnosis not present

## 2016-05-09 DIAGNOSIS — E559 Vitamin D deficiency, unspecified: Secondary | ICD-10-CM

## 2016-05-09 HISTORY — DX: Irregular menstruation, unspecified: N92.6

## 2016-05-09 LAB — BASIC METABOLIC PANEL WITH GFR
BUN: 11 mg/dL (ref 7–25)
CALCIUM: 9.7 mg/dL (ref 8.6–10.2)
CO2: 27 mmol/L (ref 20–31)
CREATININE: 0.86 mg/dL (ref 0.50–1.10)
Chloride: 100 mmol/L (ref 98–110)
GFR, EST NON AFRICAN AMERICAN: 82 mL/min (ref >=60)
GFR, Est African American: >89 mL/min (ref >=60)
Glucose, Bld: 148 mg/dL — ABNORMAL HIGH (ref 65–99)
Potassium: 4.7 mmol/L (ref 3.5–5.3)
SODIUM: 136 mmol/L (ref 135–146)

## 2016-05-09 LAB — ALT: ALT: 38 U/L — ABNORMAL HIGH (ref 6–29)

## 2016-05-09 LAB — LIPID PANEL
Cholesterol: 200 mg/dL — ABNORMAL HIGH (ref <200)
HDL: 50 mg/dL — ABNORMAL LOW (ref >50)
LDL CALC: 110 mg/dL — AB (ref <100)
Total CHOL/HDL Ratio: 4 Ratio (ref <5.0)
Triglycerides: 201 mg/dL — ABNORMAL HIGH (ref <150)
VLDL: 40 mg/dL — ABNORMAL HIGH (ref <30)

## 2016-05-09 LAB — TSH: TSH: 1.32 mIU/L

## 2016-05-09 NOTE — Patient Instructions (Addendum)
Try to get outside and see real sunlight for 20-30 minutes a day Continue vitamin D, 1000 to 2000 iu daily Please check with your pharmacist to see which machine your insurance(s) will cover and have them let me know Please do see your eye doctor regularly, and have your eyes examined every year (or more often per his or her recommendation) Check your feet every night and let me know right away of any sores, infections, numbness, etc. Try to limit sweets, white bread, white rice, white potatoes It is okay with me for you to not check your fingerstick blood sugars (per [employer] Best Practices), unless you are interested and feel it would be helpful for you Try to limit saturated fats in your diet (bologna, hot dogs, barbeque, cheeseburgers, hamburgers, steak, bacon, sausage, cheese, etc.) and get more fresh fruits, vegetables, and whole grains Check out the information at familydoctor.org entitled "Nutrition for Weight Loss: What You Need to Know about Fad Diets" Try to lose between 1-2 pounds per week by taking in fewer calories and burning off more calories You can succeed by limiting portions, limiting foods dense in calories and fat, becoming more active, and drinking 8 glasses of water a day (64 ounces) Don't skip meals, especially breakfast, as skipping meals may alter your metabolism Do not use over-the-counter weight loss pills or gimmicks that claim rapid weight loss A healthy BMI (or body mass index) is between 18.5 and 24.9 You can calculate your ideal BMI at the NIH website [employer].huhttp://www.nhlbi.nih.gov/health/educational/lose_wt/BMI/bmicalc.htm

## 2016-05-09 NOTE — Assessment & Plan Note (Signed)
Take 2,000 iu vitamin D3 once a day through the winter

## 2016-05-09 NOTE — Assessment & Plan Note (Signed)
Patient to check with pharmacy to get DME; check FSBS once a day on average for the first 6 months or so; foot exam by MD today; eye exam UTD

## 2016-05-09 NOTE — Progress Notes (Signed)
BP 122/80 (BP Location: Left Arm, Patient Position: Sitting, Cuff Size: Normal)   Pulse 71   Temp 97.9 F (36.6 C) (Oral)   Resp 16   Ht 5\' 2"  (1.575 m)   Wt 158 lb 2 oz (71.7 kg)   LMP 02/27/2016   SpO2 96%   BMI 28.92 kg/m    Subjective:    Patient ID: Tracy Stephens Dyck, female    DOB: 04/21/1971, 45 y.o.   MRN: [id number]  HPI: Tracy Stephens Twichell is a 45 y.o. female  Chief Complaint  Patient presents with  . Follow-up    fasting labs   Patient is here for f/u Saw neurologist for primary tremor; reviewed 03/04/16 note; started BP medicine propranolol BP there was 145/96; today it is controlled and heart rate is excellent New medicine does not make her feel tired  Depressed at this time of year; doesn't get to go outside; gained 4 pounds  Type 2 DM; saw eye doctor; DME not covering her strips for some reason  No menstrual period x 3 months; no pelvic pain; wondering if perimenopausal  High cholesterol; no muscle aches on the statin; trying to limit saturated fats; not much bacon at all; no cheeseburgers now; one hamburger two weeks ago  [facility] 05/09/2016 02/12/2016 11/01/2015 04/20/2015  Decreased Interest 1 0 0 0  Down, Depressed, Hopeless 1 1 1  0  PHQ - 2 Score 2 1 1  0  Altered sleeping 1 - - -  Tired, decreased energy 1 - - -  Change in appetite 1 - - -  Feeling bad or failure about yourself  1 - - -  Trouble concentrating 1 - - -  Moving slowly or fidgety/restless 1 - - -  Suicidal thoughts 0 - - -  PHQ-9 Score 8 - - -  Difficult doing work/chores Not difficult at all - - -   Relevant past medical, surgical, family and social history reviewed Past Medical History:  Diagnosis Date  . Anxiety   . Benign essential tremor    right side  . Depression   . Fibromyalgia   . Herniated lumbar intervertebral disc   . Hypertension    history of blood pressure  . Psoriasis   . Thyroid disease   . Type 2 diabetes mellitus (HCC) 11/29/2015  . Vitamin D  deficiency 11/01/2015   Past Surgical History:  Procedure Laterality Date  . CESAREAN SECTION     2   Family History  Problem Relation Age of Onset  . Cancer Mother     lung  . Alzheimer's disease Father   . Dementia Father   . Parkinson's disease Father   . Drug abuse Sister   . Thyroid disease Sister   . Thyroid disease Sister   . Thyroid disease Sister   . Thyroid disease Sister    Social History  Substance Use Topics  . Smoking status: Never Smoker  . Smokeless tobacco: Not on file  . Alcohol use No   Interim medical history since last visit reviewed. Allergies and medications reviewed  Review of Systems Per HPI unless specifically indicated above     Objective:    BP 122/80 (BP Location: Left Arm, Patient Position: Sitting, Cuff Size: Normal)   Pulse 71   Temp 97.9 F (36.6 C) (Oral)   Resp 16   Ht 5\' 2"  (1.575 m)   Wt 158 lb 2 oz (71.7 kg)   LMP 02/27/2016   SpO2 96%   BMI  28.92 kg/m   Wt Readings from Last 3 Encounters:  05/09/16 158 lb 2 oz (71.7 kg)  02/12/16 156 lb (70.8 kg)  11/29/15 162 lb (73.5 kg)    Physical Exam  Constitutional: She appears well-developed and well-nourished. No distress.  HENT:  Head: Normocephalic and atraumatic.  Eyes: EOM are normal. No scleral icterus.  Neck: No JVD present.  Cardiovascular: Normal rate.   Pulmonary/Chest: Effort normal.  Abdominal: Soft. She exhibits no distension.  Musculoskeletal: She exhibits no edema.  Neurological: She is alert.  Skin: Skin is warm and dry. She is not diaphoretic. No pallor.  Psychiatric: She has a normal mood and affect. Her behavior is normal. Judgment and thought content normal.   Diabetic Foot Form - Detailed   Diabetic Foot Exam - detailed Diabetic Foot exam was performed with the following findings:  Yes 05/09/2016  2:50 PM  Visual Foot Exam completed.:  Yes  Are the toenails ingrown?:  No Normal Range of Motion:  Yes Pulse Foot Exam completed.:  Yes  Right  Dorsalis Pedis:  Present Left Dorsalis Pedis:  Present  Sensory Foot Exam Completed.:  Yes Semmes-Weinstein Monofilament Test R Site 1-Great Toe:  Pos L Site 1-Great Toe:  Pos  R Site 4:  Pos L Site 4:  Pos  R Site 5:  Pos L Site 5:  Pos          Assessment & Plan:   Problem List Items Addressed This Visit      Endocrine   Type 2 diabetes mellitus (HCC)    Patient to check with pharmacy to get DME; check FSBS once a day on average for the first 6 months or so; foot exam by MD today; eye exam UTD      Relevant Orders   Hemoglobin A1c (Completed)   BASIC METABOLIC PANEL WITH GFR (Completed)   Hypothyroidism, adult    Gaining weight and mood declining; check TSH and adjust if needed      Relevant Medications   propranolol (INDERAL) 20 MG tablet     Other   Vitamin D deficiency    Take 2,000 iu vitamin D3 once a day through the winter      Irregular periods    Check LH and FSH      Relevant Orders   Luteinizing hormone (Completed)   Follicle stimulating hormone (Completed)   Hyperlipidemia LDL goal <100    Check lipids; avoid saturated fats      Relevant Medications   propranolol (INDERAL) 20 MG tablet   Encounter for screening for HIV    Discussed one-time HIV screening recommendation per USPSTF guidelines; patient agrees with testing; HIV antibody ordered       Relevant Orders   HIV antibody (Completed)   Encounter for medication monitoring      Follow up plan: Return in about 3 months (around 08/09/2016) for fasting labs and visit.  An after-visit summary was printed and given to the patient at check-out.  Please see the patient instructions which may contain other information and recommendations beyond what is mentioned above in the assessment and plan.  Meds ordered this encounter  Medications  . DISCONTD: levothyroxine (SYNTHROID, LEVOTHROID) 137 MCG tablet    Sig: Take by mouth.  . propranolol (INDERAL) 20 MG tablet    Sig: Take 1 tablet in the  morning and 1 tablet in the afternoon    Orders Placed This Encounter  Procedures  . Hemoglobin A1c  . BASIC METABOLIC PANEL  WITH GFR  . Luteinizing hormone  . Follicle stimulating hormone  . HIV antibody  (TSH was ordered and done)

## 2016-05-09 NOTE — Assessment & Plan Note (Signed)
Discussed one-time HIV screening recommendation per USPSTF guidelines; patient agrees with testing; HIV antibody ordered 

## 2016-05-09 NOTE — Assessment & Plan Note (Signed)
[facility]

## 2016-05-09 NOTE — Assessment & Plan Note (Signed)
Gaining weight and mood declining; check TSH and adjust if needed

## 2016-05-09 NOTE — Assessment & Plan Note (Signed)
Check lipids; avoid saturated fats 

## 2016-05-10 ENCOUNTER — Other Ambulatory Visit: Payer: Self-pay | Admitting: Family Medicine

## 2016-05-10 ENCOUNTER — Telehealth: Payer: Self-pay

## 2016-05-10 LAB — HEMOGLOBIN A1C
Hgb A1c MFr Bld: 7.4 % — ABNORMAL HIGH (ref <5.7)
MEAN PLASMA GLUCOSE: 166 mg/dL

## 2016-05-10 LAB — HIV ANTIBODY (ROUTINE TESTING W REFLEX): HIV: NONREACTIVE

## 2016-05-10 LAB — LUTEINIZING HORMONE: LH: 13.4 m[IU]/mL

## 2016-05-10 LAB — FOLLICLE STIMULATING HORMONE: FSH: 4.8 m[IU]/mL

## 2016-05-10 MED ORDER — LEVOTHYROXINE SODIUM 137 MCG PO TABS: ORAL_TABLET | ORAL | 2 refills | Status: DC

## 2016-05-10 MED ORDER — ATORVASTATIN CALCIUM 20 MG PO TABS: 20.0000 mg | ORAL_TABLET | Freq: Every day | ORAL | 2 refills | Status: DC

## 2016-05-10 MED ORDER — METFORMIN HCL ER 500 MG PO TB24: 1000.0000 mg | ORAL_TABLET | Freq: Two times a day (BID) | ORAL | 2 refills | Status: DC

## 2016-05-10 MED ORDER — LEVOTHYROXINE SODIUM 125 MCG PO TABS: ORAL_TABLET | ORAL | 2 refills | Status: DC

## 2016-05-10 NOTE — Progress Notes (Signed)
Increase metformin; continue statin; low fat diet; continue thyroid med; return in 3 months

## 2016-05-10 NOTE — Telephone Encounter (Signed)
-----   Message from Kerman PasseyMelinda P Lada, MD sent at 05/10/2016 12:01 PM EST ----- Please let pt know that her thyroid test is now normal; her cholesterol is better than before; we'll keep her dose where it is, and I'll send in refills; keep trying to lose weight and eat healthier; liver function is stable; likely fatty liver, so work on weight loss and low fat diet; her female hormones do not look menopausal; keep us posted if no period in 2 months; A1c has gone up; increase the metformin to 1000 mg twice a day; return for visit and fasting labs in 3 months

## 2016-05-10 NOTE — Telephone Encounter (Signed)
Called pt regarding lab results. Pt was satisfied with the results and also mention to the pt that she should have refills at her pharmacy.

## 2016-05-13 ENCOUNTER — Ambulatory Visit: Payer: Medicare Other | Admitting: Family Medicine

## 2016-05-22 ENCOUNTER — Telehealth: Payer: Self-pay

## 2016-05-22 NOTE — Telephone Encounter (Signed)
I'm not sure how [employer] got involved Primary Insurance tab in header says "no coverage for visit" and she does not have an [facility] Please call patient and see if she knows what's going on, if she wants a 90 day supply, and if so, to where Thank you

## 2016-05-22 NOTE — Telephone Encounter (Signed)
Katie a representative from [employer] called and was wondering if we received a request for a 90 day supply of Atorvastatin instead of a month supply. I  Mention to patient I didn't see in our notes that it was put in. She stated that the patient would save 2 copays every 3 months if it was Prescribed for a 90 day supply.

## 2016-05-23 NOTE — Telephone Encounter (Signed)
Called pt and discuss  this issue. Patient stated she called them regarding something else but they told her she wasn't able to get that from them. She never mention anything about a 90 day supply of her stain.  Patient asked has she been scam. I told  her no, because no information was given out. When they called they gave me the medication name, her  Name and date and  dosage and asked if I can request  A 90 day supply. Pt not worried and I mention to her that we note every call and just in case they call back regarding the same issue we know that she do not want a 90 day supply.

## 2016-07-08 ENCOUNTER — Other Ambulatory Visit: Payer: Self-pay | Admitting: Family Medicine

## 2016-07-23 ENCOUNTER — Encounter: Payer: Self-pay | Admitting: Emergency Medicine

## 2016-07-23 ENCOUNTER — Emergency Department
Admission: EM | Admit: 2016-07-23 | Discharge: 2016-07-23 | Disposition: A | Discharge status: Home / Self Care | Payer: Medicare Other | Attending: Emergency Medicine | Admitting: Emergency Medicine

## 2016-07-23 DIAGNOSIS — L237 Allergic contact dermatitis due to plants, except food: Secondary | ICD-10-CM | POA: Diagnosis not present

## 2016-07-23 DIAGNOSIS — E119 Type 2 diabetes mellitus without complications: Secondary | ICD-10-CM | POA: Insufficient documentation

## 2016-07-23 DIAGNOSIS — Z79899 Other long term (current) drug therapy: Secondary | ICD-10-CM | POA: Insufficient documentation

## 2016-07-23 DIAGNOSIS — I10 Essential (primary) hypertension: Secondary | ICD-10-CM | POA: Diagnosis not present

## 2016-07-23 DIAGNOSIS — T7840XA Allergy, unspecified, initial encounter: Secondary | ICD-10-CM | POA: Diagnosis present

## 2016-07-23 DIAGNOSIS — Z7984 Long term (current) use of oral hypoglycemic drugs: Secondary | ICD-10-CM | POA: Insufficient documentation

## 2016-07-23 DIAGNOSIS — E039 Hypothyroidism, unspecified: Secondary | ICD-10-CM | POA: Diagnosis not present

## 2016-07-23 MED ORDER — FAMOTIDINE 20 MG PO TABS: 20.0000 mg | ORAL_TABLET | Freq: Two times a day (BID) | ORAL | 0 refills | Status: DC

## 2016-07-23 MED ORDER — FAMOTIDINE IN NACL 20-0.9 MG/50ML-% IV SOLN
20.0000 mg | Freq: Once | INTRAVENOUS | Status: AC
  Administered 2016-07-23: 20 mg via INTRAVENOUS
  Filled 2016-07-23: qty 50

## 2016-07-23 MED ORDER — DEXAMETHASONE SODIUM PHOSPHATE 10 MG/ML IJ SOLN
10.0000 mg | Freq: Once | INTRAMUSCULAR | Status: AC
  Administered 2016-07-23: 10 mg via INTRAVENOUS
  Filled 2016-07-23: qty 1

## 2016-07-23 MED ORDER — DIPHENHYDRAMINE HCL 50 MG PO TABS: 50.0000 mg | ORAL_TABLET | Freq: Four times a day (QID) | ORAL | 0 refills | Status: DC | PRN

## 2016-07-23 MED ORDER — DEXAMETHASONE 4 MG PO TABS: ORAL_TABLET | ORAL | 0 refills | Status: DC

## 2016-07-23 MED ORDER — DIPHENHYDRAMINE HCL 50 MG/ML IJ SOLN
50.0000 mg | Freq: Once | INTRAMUSCULAR | Status: AC
  Administered 2016-07-23: 50 mg via INTRAVENOUS
  Filled 2016-07-23: qty 1

## 2016-07-23 NOTE — ED Triage Notes (Signed)
Pt with facial swelling that started yesterday. Pt reports burning poison ivy Saturday. Pt denies airway problems.

## 2016-07-23 NOTE — ED Provider Notes (Signed)
[facility] Emergency Department Provider Note  ____________________________________________  Time seen: Approximately 11:13 AM  I have reviewed the triage vital signs and the nursing notes.   HISTORY  Chief Complaint Allergic Reaction    HPI Tracy Stephens is a 46 y.o. female , NAD, presents to the emergency department for evaluation of allergic reaction. Patient states she and her family were burning leaves in wooded area which included poison ivy and on Saturday. States she has had burning and itching about the skin including her face, neck and extremities since that time. He is been taking over-the-counter Benadryl without any significant relief as well as applying calamine lotion. States that today she woke with her face increasingly red, swollen and felt irritation in her throat and chest. Denies shortness of breath or chest pain. Has had no swelling about the lips/tongue or throat. Has not had any difficulty eating or drinking. Denies any visual changes. Has had no abdominal pain, nausea or vomiting.   Past Medical History:  Diagnosis Date  . Anxiety   . Benign essential tremor    right side  . Depression   . Fibromyalgia   . Herniated lumbar intervertebral disc   . Hypertension    history of blood pressure  . Psoriasis   . Thyroid disease   . Type 2 diabetes mellitus ([place]) 11/29/2015  . Vitamin D deficiency 11/01/2015    Patient Active Problem List   Diagnosis Date Noted  . Irregular periods 05/09/2016  . Encounter for screening for HIV 05/09/2016  . Psoriasis 02/12/2016  . Insomnia 02/12/2016  . Encounter for medication monitoring 02/12/2016  . Type 2 diabetes mellitus ([place]) 11/29/2015  . Tremors of nervous system 11/01/2015  . Vitamin D deficiency 11/01/2015  . Hypothyroidism, adult 04/20/2015  . Fibromyalgia 04/20/2015  . Hyperlipidemia LDL goal <100 04/20/2015  . Family history of colon cancer in mother 04/20/2015  . Need for  immunization against influenza 04/20/2015  . Pityriasis rosea 04/20/2015    Past Surgical History:  Procedure Laterality Date  . CESAREAN SECTION     2    Prior to Admission medications   Medication Sig Start Date End Date Taking? Authorizing Provider  ACCU-CHEK FASTCLIX LANCETS MISC Check FSBS once a day; E11.9, LON 6 months 02/28/16   Arnetha Courser, MD  atorvastatin (LIPITOR) 20 MG tablet Take 1 tablet (20 mg total) by mouth at bedtime. 05/10/16   Arnetha Courser, MD  Blood Glucose Monitoring Suppl (ACCU-CHEK AVIVA PLUS) w/Device KIT Check FSBS once a day; Dx E11.9, LON 6 months 02/28/16   Arnetha Courser, MD  cholecalciferol (VITAMIN D) 1000 units tablet Take 2,000 Units by mouth 2 (two) times daily.    Historical Provider, MD  dexamethasone (DECADRON) 4 MG tablet Take 6 tablets on Day 1 with food, then decrease by 1 tablet daily until finished (6,5,4,3,2,1) 07/23/16   Izaha Shughart L Aahna Rossa, PA-C  diphenhydrAMINE (BENADRYL) 50 MG tablet Take 1 tablet (50 mg total) by mouth every 6 (six) hours as needed for itching. 07/23/16 07/30/16  Bookert Guzzi L Ryleah Miramontes, PA-C  famotidine (PEPCID) 20 MG tablet Take 1 tablet (20 mg total) by mouth 2 (two) times daily. 07/23/16 07/30/16  Naven Giambalvo L Mehreen Azizi, PA-C  gabapentin (NEURONTIN) 100 MG capsule Take 100 mg by mouth daily.    Historical Provider, MD  glucose blood (ACCU-CHEK AVIVA) test strip Use as instructed to check fingerstick blood sugar once a day; DX E11.9; LON 6 months 02/28/16   Arnetha Courser,  MD  Lancets Misc. (ACCU-CHEK FASTCLIX LANCET) KIT Check FSBS once a day; E11.9, LON 6 months 02/28/16   Arnetha Courser, MD  levothyroxine (SYNTHROID, LEVOTHROID) 125 MCG tablet take 1 tablet by mouth ON TUES, THURS, SAT, AND SUN 05/10/16   Arnetha Courser, MD  levothyroxine (SYNTHROID, LEVOTHROID) 137 MCG tablet take 1 tablet by mouth ON MONDAYS,WEDNESDAYS, AND FRIDAYS 05/10/16   Arnetha Courser, MD  metFORMIN (GLUCOPHAGE XR) 500 MG 24 hr tablet Take 2 tablets (1,000 mg total) by mouth 2  (two) times daily. One by mouth every evening x 1 week, then two every evening 05/10/16   Arnetha Courser, MD  propranolol (INDERAL) 20 MG tablet Take 1 tablet in the morning and 1 tablet in the afternoon 03/04/16   Historical Provider, MD  traZODone (DESYREL) 150 MG tablet take 1 tablet by mouth at bedtime 07/10/16   Arnetha Courser, MD    Allergies Lyrica [pregabalin]  Family History  Problem Relation Age of Onset  . Cancer Mother     lung  . Alzheimer's disease Father   . Dementia Father   . Parkinson's disease Father   . Drug abuse Sister   . Thyroid disease Sister   . Thyroid disease Sister   . Thyroid disease Sister   . Thyroid disease Sister     Social History Social History  Substance Use Topics  . Smoking status: Never Smoker  . Smokeless tobacco: Never Used  . Alcohol use No     Review of Systems  Constitutional: No fever/chills, fatigue Eyes: No visual changes.  ENT: Positive sore throat. No swelling about the lips/tongue/throat. Cardiovascular: No chest pain. Respiratory:  No shortness of breath. No wheezing.  Gastrointestinal: No abdominal pain.  No nausea, vomiting.   Musculoskeletal: Negative for General myalgias.  Skin: Positive for rash, redness, itching and swelling about the face, extremities. Neurological: Negative for headaches. 10-point ROS otherwise negative.  ____________________________________________   PHYSICAL EXAM:  VITAL SIGNS: ED Triage Vitals [07/23/16 1054]  Enc Vitals Group     BP (!) 148/90     Pulse Rate 70     Resp 18     Temp 98.1 F (36.7 C)     Temp Source Oral     SpO2 97 %     Weight 155 lb (70.3 kg)     Height '5\' 2"'  (1.575 m)     Head Circumference      Peak Flow      Pain Score      Pain Loc      Pain Edu?      Excl. in [place]?      Constitutional: Alert and oriented. Well appearing and in no acute distress. Eyes: Bilateral upper and lower eyelids with edema, erythema and dry skin. Conjunctivae are normal.  PERRL. EOMI without pain.  Head: Atraumatic. ENT:      Ears: Bilateral ear pinna with erythema and mild edema with dry skin. No discharge from bilateral ears. No tenderness to palpation of the ears.      Nose: No congestion/rhinnorhea. No epistaxis.      Mouth/Throat: Mucous membranes are moist. Pharynx without erythema, swelling, exudate. Uvula is midline. Airway is patent. No swelling about the lips/tongue/throat. Neck: No stridor. Supple with full range of motion. Hematological/Lymphatic/Immunilogical: No cervical lymphadenopathy. Cardiovascular: Normal rate, regular rhythm. Normal S1 and S2.  Good peripheral circulation. Respiratory: Normal respiratory effort without tachypnea or retractions. Lungs CTAB with breath sounds noted in all lung  fields. No wheeze, rhonchi or rales. Musculoskeletal: FROM of bilateral upper and lower extremities without pain or difficulty Neurologic:  Normal speech and language. No gross focal neurologic deficits are appreciated.  Skin:  Skin about the face, neck, upper extremities with diffuse trace erythema, and diffuse dry skin. Edema is noted about the eyelids. Whelp-like rashes are noted about the extremities and the upper chest and neck. Skin is warm, dry and intact. Psychiatric: Mood and affect are normal. Speech and behavior are normal. Patient exhibits appropriate insight and judgement.   ____________________________________________   LABS  None ____________________________________________  EKG  EKG reveals normal sinus rhythm with a ventricular rate of 65 bpm. No acute changes or evidence of STEMI. EKG also reviewed by Dr. Lavonia Drafts. ____________________________________________  RADIOLOGY  None ____________________________________________    PROCEDURES  Procedure(s) performed: None   Procedures   Medications  dexamethasone (DECADRON) injection 10 mg (10 mg Intravenous Given 07/23/16 1202)  diphenhydrAMINE (BENADRYL) injection 50  mg (50 mg Intravenous Given 07/23/16 1202)  famotidine (PEPCID) IVPB 20 mg premix (0 mg Intravenous Stopped 07/23/16 1235)     ____________________________________________   INITIAL IMPRESSION / ASSESSMENT AND PLAN / ED COURSE  Pertinent labs & imaging results that were available during my care of the patient were reviewed by me and considered in my medical decision making (see chart for details).  Clinical Course as of Jul 23 1298  Tue Jul 23, 2016  1233 Patient has had itching has improved. Patient notes that the IV is causing significant pain in her arm. No evidence of soft tissue effusion, increased redness or warmth to the area. We'll remove the IV at this time as the patient's symptoms seem to be improving.  [JH]    Clinical Course User Index [JH] Furman Trentman L Nadia Viar, PA-C    Patient's diagnosis is consistent with Allergic reaction and allergic contact dermatitis due to plants. Patient tolerated IV Pepcid, Decadron and Benadryl with decreased itching and redness of the skin while in the emergency department. Patient will be discharged home with prescriptions for Decadron dose pack, Benadryl and Pepcid to take as directed. Patient is to follow up with her primary care provider in 24 hours if symptoms persist past this treatment course. Patient is given ED precautions to return to the ED for any worsening or new symptoms.   ____________________________________________  FINAL CLINICAL IMPRESSION(S) / ED DIAGNOSES  Final diagnoses:  Allergic reaction, initial encounter  Allergic contact dermatitis due to plants, except food      NEW MEDICATIONS STARTED DURING THIS VISIT:  New Prescriptions   DEXAMETHASONE (DECADRON) 4 MG TABLET    Take 6 tablets on Day 1 with food, then decrease by 1 tablet daily until finished (6,5,4,3,2,1)   DIPHENHYDRAMINE (BENADRYL) 50 MG TABLET    Take 1 tablet (50 mg total) by mouth every 6 (six) hours as needed for itching.   FAMOTIDINE (PEPCID) 20 MG TABLET     Take 1 tablet (20 mg total) by mouth 2 (two) times daily.         Braxton Feathers, PA-C 07/23/16 1300    Earleen Newport, MD 07/23/16 1329

## 2016-08-12 ENCOUNTER — Encounter: Payer: Self-pay | Admitting: Family Medicine

## 2016-08-12 ENCOUNTER — Ambulatory Visit (INDEPENDENT_AMBULATORY_CARE_PROVIDER_SITE_OTHER): Payer: Medicare Other | Admitting: Family Medicine

## 2016-08-12 DIAGNOSIS — E1165 Type 2 diabetes mellitus with hyperglycemia: Secondary | ICD-10-CM

## 2016-08-12 DIAGNOSIS — Z5181 Encounter for therapeutic drug level monitoring: Secondary | ICD-10-CM

## 2016-08-12 DIAGNOSIS — N926 Irregular menstruation, unspecified: Secondary | ICD-10-CM | POA: Diagnosis not present

## 2016-08-12 DIAGNOSIS — E039 Hypothyroidism, unspecified: Secondary | ICD-10-CM

## 2016-08-12 DIAGNOSIS — E785 Hyperlipidemia, unspecified: Secondary | ICD-10-CM | POA: Diagnosis not present

## 2016-08-12 LAB — HEMOGLOBIN A1C
Hgb A1c MFr Bld: 8.2 % — ABNORMAL HIGH (ref <5.7)
Mean Plasma Glucose: 189 mg/dL

## 2016-08-12 LAB — COMPREHENSIVE METABOLIC PANEL
ALBUMIN: 4 g/dL (ref 3.6–5.1)
ALT: 41 U/L — ABNORMAL HIGH (ref 6–29)
AST: 26 U/L (ref 10–35)
Alkaline Phosphatase: 79 U/L (ref 33–115)
BILIRUBIN TOTAL: 0.4 mg/dL (ref 0.2–1.2)
BUN: 8 mg/dL (ref 7–25)
CALCIUM: 9.6 mg/dL (ref 8.6–10.2)
CHLORIDE: 101 mmol/L (ref 98–110)
CO2: 28 mmol/L (ref 20–31)
Creat: 0.88 mg/dL (ref 0.50–1.10)
Glucose, Bld: 330 mg/dL — ABNORMAL HIGH (ref 65–99)
Potassium: 4.7 mmol/L (ref 3.5–5.3)
Sodium: 137 mmol/L (ref 135–146)
Total Protein: 6.6 g/dL (ref 6.1–8.1)

## 2016-08-12 LAB — LIPID PANEL
Cholesterol: 215 mg/dL — ABNORMAL HIGH (ref <200)
HDL: 50 mg/dL — AB (ref >50)
LDL Cholesterol: 107 mg/dL — ABNORMAL HIGH (ref <100)
TRIGLYCERIDES: 291 mg/dL — AB (ref <150)
Total CHOL/HDL Ratio: 4.3 Ratio (ref <5.0)
VLDL: 58 mg/dL — ABNORMAL HIGH (ref <30)

## 2016-08-12 LAB — TSH: TSH: 8.59 mIU/L — ABNORMAL HIGH

## 2016-08-12 MED ORDER — VITAMIN D (ERGOCALCIFEROL) 1.25 MG (50000 UNIT) PO CAPS: 50000.0000 [IU] | ORAL_CAPSULE | ORAL | 3 refills | Status: DC

## 2016-08-12 NOTE — Assessment & Plan Note (Signed)
Check nonfasting lipids today; encouraged diet low in saturated fats which patient is trying to adhere to

## 2016-08-12 NOTE — Assessment & Plan Note (Signed)
Check labs 

## 2016-08-12 NOTE — Patient Instructions (Signed)
Keep up the good work trying to manage your weight Get some outside time We'll have you take 50,000 iu of vitamin D once a month (prescription), no do not take any other vitamin D Try to limit saturated fats in your diet (bologna, hot dogs, barbeque, cheeseburgers, hamburgers, steak, bacon, sausage, cheese, etc.) and get more fresh fruits, vegetables, and whole grains We'll contact you about labs and pelvic [place]

## 2016-08-12 NOTE — Assessment & Plan Note (Signed)
Current dosage seems to be working; last TSH x 3 reviewed

## 2016-08-12 NOTE — Assessment & Plan Note (Signed)
Order pelvic US; normal FSH and LH in November; thyroid could be culprit, but last TSH normal, will check TSH again

## 2016-08-12 NOTE — Progress Notes (Signed)
BP 122/74   Pulse 80   Temp 98.1 F (36.7 C) (Oral)   Resp 14   Wt 158 lb (71.7 kg)   LMP 07/29/2016   SpO2 95%   BMI 28.90 kg/m    Subjective:    Patient ID: Tracy Stephens, female    DOB: Aug 14, 1970, 46 y.o.   MRN: [id number]  HPI: Tracy Stephens is a 46 y.o. female  Chief Complaint  Patient presents with  . Follow-up   Patient is here for f/u Type 2 diabetes; no problems with feet Eye exam UTD, no problems Not checking sugars (insurance not paying for it) Knows which foods to avoid Not much dry mouth or blurred vision, eyes are getting weak and needs bifocals  High cholesterol; taking statin; no aches; limiting bacon and sausage and bologna; hot dogs just once a month; not much cheese; had raisin bran for breakfast  BP well-controlled  Only gained a couple of pounds through all of winter  Hypothyroidism; energy level lately; moving bowels okay; no excessive hair loss  Depression and anxiety affected by dark days and not getting outside; does not have a light therapy lamp; mild vitamin D deficiency in May 2017 (level was 26.3); taking vitamin D sporadically  Irregular periods; went to the hospital for burning poison oak and eyes affected, then took steroids and spotted for a week  Has psoriasis; improved after the steroid shot for the poison oak; large plaque on the back  Depression screen [facility] 2/9 08/12/2016 05/09/2016 02/12/2016 11/01/2015 04/20/2015  Decreased Interest 0 1 0 0 0  Down, Depressed, Hopeless 1 1 1 1  0  PHQ - 2 Score 1 2 1 1  0  Altered sleeping - 1 - - -  Tired, decreased energy - 1 - - -  Change in appetite - 1 - - -  Feeling bad or failure about yourself  - 1 - - -  Trouble concentrating - 1 - - -  Moving slowly or fidgety/restless - 1 - - -  Suicidal thoughts - 0 - - -  PHQ-9 Score - 8 - - -  Difficult doing work/chores - Not difficult at all - - -   Relevant past medical, surgical, family and social history reviewed Past Medical History:    Diagnosis Date  . Anxiety   . Benign essential tremor    right side  . Depression   . Fibromyalgia   . Herniated lumbar intervertebral disc   . Hypertension    history of blood pressure  . Psoriasis   . Thyroid disease   . Type 2 diabetes mellitus (HCC) 11/29/2015  . Vitamin D deficiency 11/01/2015   Past Surgical History:  Procedure Laterality Date  . CESAREAN SECTION     2   Family History  Problem Relation Age of Onset  . Cancer Mother     lung  . Alzheimer's disease Father   . Dementia Father   . Parkinson's disease Father   . Drug abuse Sister   . Thyroid disease Sister   . Thyroid disease Sister   . Thyroid disease Sister   . Thyroid disease Sister   . Cancer Maternal Grandmother   . Cancer Maternal Grandfather    Social History  Substance Use Topics  . Smoking status: Never Smoker  . Smokeless tobacco: Never Used  . Alcohol use No   Interim medical history since last visit reviewed. Allergies and medications reviewed  Review of Systems Per HPI unless specifically indicated above  Objective:    BP 122/74   Pulse 80   Temp 98.1 F (36.7 C) (Oral)   Resp 14   Wt 158 lb (71.7 kg)   LMP 07/29/2016   SpO2 95%   BMI 28.90 kg/m   Wt Readings from Last 3 Encounters:  08/12/16 158 lb (71.7 kg)  07/23/16 155 lb (70.3 kg)  05/09/16 158 lb 2 oz (71.7 kg)    Physical Exam  Constitutional: She appears well-developed and well-nourished. No distress.  HENT:  Head: Normocephalic and atraumatic.  Eyes: EOM are normal. No scleral icterus.  Neck: No JVD present.  Cardiovascular: Normal rate.   Pulmonary/Chest: Effort normal.  Abdominal: Soft. She exhibits no distension.  Musculoskeletal: She exhibits no edema.  Neurological: She is alert.  Skin: Skin is warm and dry. She is not diaphoretic. No pallor.  Psychiatric: She has a normal mood and affect. Her behavior is normal. Judgment and thought content normal.   Diabetic Foot Form - Detailed   Diabetic  Foot Exam - detailed Diabetic Foot exam was performed with the following findings:  Yes 08/12/2016 11:02 AM  Visual Foot Exam completed.:  Yes  Are the toenails ingrown?:  No Normal Range of Motion:  Yes Pulse Foot Exam completed.:  Yes  Right Dorsalis Pedis:  Present Left Dorsalis Pedis:  Present  Sensory Foot Exam Completed.:  Yes Swelling:  No Semmes-Weinstein Monofilament Test R Site 1-Great Toe:  Pos L Site 1-Great Toe:  Pos  R Site 4:  Pos L Site 4:  Pos  R Site 5:  Pos L Site 5:  Pos        Results for orders placed or performed in visit on 05/09/16  TSH  Result Value Ref Range   TSH 1.32 mIU/L  Lipid panel  Result Value Ref Range   Cholesterol 200 (H) <200 mg/dL   Triglycerides 409201 (H) <150 mg/dL   HDL 50 (L) >81>50 mg/dL   Total CHOL/HDL Ratio 4.0 <5.0 Ratio   VLDL 40 (H) <30 mg/dL   LDL Cholesterol 191110 (H) <100 mg/dL  ALT  Result Value Ref Range   ALT 38 (H) 6 - 29 U/L  Hemoglobin A1c  Result Value Ref Range   Hgb A1c MFr Bld 7.4 (H) <5.7 %   Mean Plasma Glucose 166 mg/dL  BASIC METABOLIC PANEL WITH GFR  Result Value Ref Range   Sodium 136 135 - 146 mmol/L   Potassium 4.7 3.5 - 5.3 mmol/L   Chloride 100 98 - 110 mmol/L   CO2 27 20 - 31 mmol/L   Glucose, Bld 148 (H) 65 - 99 mg/dL   BUN 11 7 - 25 mg/dL   Creat 4.780.86 2.950.50 - 6.211.10 mg/dL   Calcium 9.7 8.6 - 30.810.2 mg/dL   GFR, Est African American >89 >=60 mL/min   GFR, Est Non African American 82 >=60 mL/min  Luteinizing hormone  Result Value Ref Range   LH 13.4 mIU/mL  Follicle stimulating hormone  Result Value Ref Range   FSH 4.8 mIU/mL  HIV antibody  Result Value Ref Range   HIV 1&2 Ab, 4th Generation NONREACTIVE NONREACTIVE      Assessment & Plan:   Problem List Items Addressed This Visit      Endocrine   Type 2 diabetes mellitus (HCC)    Check A1c; foot exam by MD; eye exam UTD      Relevant Orders   Hemoglobin A1c   Hypothyroidism, adult    Current dosage seems to  be working; last TSH x 3  reviewed      Relevant Orders   TSH     Other   Irregular periods    Order pelvic US; normal FSH and LH in November; thyroid could be culprit, but last TSH normal, will check TSH again      Relevant Orders   US Pelvis Complete   US Transvaginal Non-OB   Hyperlipidemia LDL goal <100    Check nonfasting lipids today; encouraged diet low in saturated fats which patient is trying to adhere to      Relevant Orders   Lipid panel   Encounter for medication monitoring    Check labs      Relevant Orders   Comprehensive metabolic panel       Follow up plan: Return in about 3 months (around 11/09/2016) for diabetes if A1c 7 or higher; 6 months if less than 7.  An after-visit summary was printed and given to the patient at check-out.  Please see the patient instructions which may contain other information and recommendations beyond what is mentioned above in the assessment and plan.  Meds ordered this encounter  Medications  . Vitamin D, Ergocalciferol, (DRISDOL) 50000 units CAPS capsule    Sig: Take 1 capsule (50,000 Units total) by mouth every 30 (thirty) days.    Dispense:  1 capsule    Refill:  3    Orders Placed This Encounter  Procedures  . US Pelvis Complete  . US Transvaginal Non-OB  . Hemoglobin A1c  . Comprehensive metabolic panel  . Lipid panel  . TSH

## 2016-08-12 NOTE — Assessment & Plan Note (Addendum)
Check A1c; foot exam by MD; eye exam UTD

## 2016-08-13 ENCOUNTER — Other Ambulatory Visit: Payer: Self-pay | Admitting: Family Medicine

## 2016-08-13 MED ORDER — ATORVASTATIN CALCIUM 40 MG PO TABS: 40.0000 mg | ORAL_TABLET | Freq: Every day | ORAL | 1 refills | Status: DC

## 2016-08-13 NOTE — Telephone Encounter (Signed)
Rx failure notice received Rx called in to pharmacy for atorvastatin

## 2016-08-13 NOTE — Telephone Encounter (Signed)
Increase statin I called her, left message, increasing chol med, will talk to her tomorrow about other labs

## 2016-08-14 ENCOUNTER — Telehealth: Payer: Self-pay | Admitting: Family Medicine

## 2016-08-14 ENCOUNTER — Other Ambulatory Visit: Payer: Self-pay | Admitting: Family Medicine

## 2016-08-14 DIAGNOSIS — E039 Hypothyroidism, unspecified: Secondary | ICD-10-CM

## 2016-08-14 DIAGNOSIS — Z5181 Encounter for therapeutic drug level monitoring: Secondary | ICD-10-CM

## 2016-08-14 DIAGNOSIS — E785 Hyperlipidemia, unspecified: Secondary | ICD-10-CM

## 2016-08-14 MED ORDER — CANAGLIFLOZIN 100 MG PO TABS: 100.0000 mg | ORAL_TABLET | Freq: Every day | ORAL | 5 refills | Status: DC

## 2016-08-14 MED ORDER — LEVOTHYROXINE SODIUM 137 MCG PO TABS: 137.0000 ug | ORAL_TABLET | Freq: Every day | ORAL | 1 refills | Status: DC

## 2016-08-14 NOTE — Assessment & Plan Note (Signed)
Check tsh after dose change in 6-8 weeks

## 2016-08-14 NOTE — Telephone Encounter (Signed)
Rx request received for 20 mg; I've increased it to 40 mg I phoned that Rx in personally

## 2016-08-14 NOTE — Telephone Encounter (Signed)
Pt returning your call. Asked that when you call her back and if she does not answer for you to call right back due to having phone issues

## 2016-08-14 NOTE — Assessment & Plan Note (Signed)
Check labs in 6-8 weeks on higher dose of statin

## 2016-08-14 NOTE — Assessment & Plan Note (Signed)
Check sgpt in 6-8 weeks 

## 2016-08-14 NOTE — Telephone Encounter (Signed)
I left msg, tried to call

## 2016-08-14 NOTE — Telephone Encounter (Signed)
I spoke with patient Glucose too high; add invokana Continue metformin Lipids too high Increase statin TSH too high Increase thyroid med Recheck labs 6-8 weeks, around April 4th or just after; labs only

## 2016-08-16 ENCOUNTER — Ambulatory Visit
Admission: RE | Admit: 2016-08-16 | Discharge: 2016-08-16 | Disposition: A | Discharge status: Home / Self Care | Payer: Medicare Other | Source: Ambulatory Visit | Attending: Family Medicine | Admitting: Family Medicine

## 2016-08-16 DIAGNOSIS — N926 Irregular menstruation, unspecified: Secondary | ICD-10-CM | POA: Diagnosis present

## 2016-08-16 DIAGNOSIS — N83202 Unspecified ovarian cyst, left side: Secondary | ICD-10-CM | POA: Diagnosis not present

## 2016-08-20 ENCOUNTER — Other Ambulatory Visit: Payer: Self-pay | Admitting: Family Medicine

## 2016-08-20 MED ORDER — LEVOTHYROXINE SODIUM 137 MCG PO TABS: 137.0000 ug | ORAL_TABLET | Freq: Every day | ORAL | 1 refills | Status: DC

## 2016-08-21 ENCOUNTER — Encounter: Payer: Self-pay | Admitting: Family Medicine

## 2016-08-21 ENCOUNTER — Other Ambulatory Visit: Payer: Self-pay | Admitting: Family Medicine

## 2016-08-21 DIAGNOSIS — N926 Irregular menstruation, unspecified: Secondary | ICD-10-CM

## 2016-08-21 DIAGNOSIS — N83209 Unspecified ovarian cyst, unspecified side: Secondary | ICD-10-CM

## 2016-08-21 DIAGNOSIS — R6881 Early satiety: Secondary | ICD-10-CM

## 2016-08-21 DIAGNOSIS — N83202 Unspecified ovarian cyst, left side: Secondary | ICD-10-CM

## 2016-08-21 HISTORY — DX: Early satiety: R68.81

## 2016-08-21 HISTORY — DX: Unspecified ovarian cyst, unspecified side: N83.209

## 2016-08-21 NOTE — Assessment & Plan Note (Signed)
Refer to gyn 

## 2016-08-21 NOTE — Progress Notes (Signed)
Refer to gyn 

## 2016-08-21 NOTE — Assessment & Plan Note (Signed)
Refer to gyn with abnormal ovary on [place]

## 2016-10-12 ENCOUNTER — Other Ambulatory Visit: Payer: Self-pay | Admitting: Family Medicine

## 2016-10-12 NOTE — Telephone Encounter (Signed)
Please give patient a gentle reminder that her labs were due around April 4th Please come in this week for fasting labs I'll send refill of medicine as requested; thank you

## 2016-10-14 ENCOUNTER — Other Ambulatory Visit: Payer: Self-pay

## 2016-10-14 DIAGNOSIS — E039 Hypothyroidism, unspecified: Secondary | ICD-10-CM

## 2016-10-14 DIAGNOSIS — E785 Hyperlipidemia, unspecified: Secondary | ICD-10-CM

## 2016-10-14 DIAGNOSIS — Z5181 Encounter for therapeutic drug level monitoring: Secondary | ICD-10-CM

## 2016-10-14 NOTE — Telephone Encounter (Signed)
Left detailed voicemail

## 2016-10-22 ENCOUNTER — Other Ambulatory Visit: Payer: Self-pay | Admitting: Family Medicine

## 2016-10-23 NOTE — Telephone Encounter (Signed)
Patient will be due for an appointment on or after May 19th; please schedule, ask her to come fasting I'll refill medicine

## 2016-10-30 ENCOUNTER — Telehealth: Payer: Self-pay | Admitting: Family Medicine

## 2016-10-30 NOTE — Telephone Encounter (Signed)
Patient is overdue for labs However, she is almost due again now for her next appt and A1c too Please have her schedule a visit on or just after 11/09/16 and we'll get the pending labs plus an A1c at her visit; thank you

## 2016-10-31 NOTE — Telephone Encounter (Signed)
Patient notified and scheduled for 5/21

## 2016-11-07 ENCOUNTER — Ambulatory Visit: Payer: Self-pay | Admitting: Family Medicine

## 2016-11-11 ENCOUNTER — Encounter: Payer: Self-pay | Admitting: Family Medicine

## 2016-11-11 ENCOUNTER — Ambulatory Visit (INDEPENDENT_AMBULATORY_CARE_PROVIDER_SITE_OTHER): Payer: Medicare Other | Admitting: Family Medicine

## 2016-11-11 DIAGNOSIS — E1165 Type 2 diabetes mellitus with hyperglycemia: Secondary | ICD-10-CM | POA: Diagnosis not present

## 2016-11-11 DIAGNOSIS — E559 Vitamin D deficiency, unspecified: Secondary | ICD-10-CM | POA: Diagnosis not present

## 2016-11-11 DIAGNOSIS — Z5181 Encounter for therapeutic drug level monitoring: Secondary | ICD-10-CM

## 2016-11-11 DIAGNOSIS — E785 Hyperlipidemia, unspecified: Secondary | ICD-10-CM

## 2016-11-11 DIAGNOSIS — E039 Hypothyroidism, unspecified: Secondary | ICD-10-CM

## 2016-11-11 LAB — LIPID PANEL
CHOL/HDL RATIO: 4.1 ratio (ref <5.0)
CHOLESTEROL: 142 mg/dL (ref <200)
HDL: 35 mg/dL — AB (ref >50)
LDL Cholesterol: 77 mg/dL (ref <100)
Triglycerides: 152 mg/dL — ABNORMAL HIGH (ref <150)
VLDL: 30 mg/dL (ref <30)

## 2016-11-11 LAB — COMPLETE METABOLIC PANEL WITH GFR
ALT: 25 U/L (ref 6–29)
AST: 19 U/L (ref 10–35)
Albumin: 4.1 g/dL (ref 3.6–5.1)
Alkaline Phosphatase: 83 U/L (ref 33–115)
BILIRUBIN TOTAL: 0.4 mg/dL (ref 0.2–1.2)
BUN: 9 mg/dL (ref 7–25)
CHLORIDE: 104 mmol/L (ref 98–110)
CO2: 26 mmol/L (ref 20–31)
Calcium: 9.4 mg/dL (ref 8.6–10.2)
Creat: 0.88 mg/dL (ref 0.50–1.10)
GFR, EST NON AFRICAN AMERICAN: 80 mL/min (ref >=60)
Glucose, Bld: 135 mg/dL — ABNORMAL HIGH (ref 65–99)
Potassium: 4.9 mmol/L (ref 3.5–5.3)
Sodium: 139 mmol/L (ref 135–146)
Total Protein: 6.6 g/dL (ref 6.1–8.1)

## 2016-11-11 NOTE — Progress Notes (Signed)
BP 120/72   Pulse 71   Temp 97.4 F (36.3 C) (Oral)   Resp 14   Wt 151 lb 4.8 oz (68.6 kg)   LMP  (LMP Unknown)   SpO2 93%   BMI 27.67 kg/m    Subjective:    Patient ID: Tracy Stephens, female    DOB: 1970/08/26, 46 y.o.   MRN: [id number]  HPI: Tracy Stephens is a 46 y.o. female  Chief Complaint  Patient presents with  . Follow-up   HPI Type 2 diabetes  no problems with her feet Last eye exam UTD Has lost 7 pounds since last visit, water and more activity Strong genetic component; father and daughter Not checking sugars with my blessing  Hypothyroidism; she is tired; thinks dose is off; having loose stools; always has some hair loss in the shower every day  High cholesterol; trying to eat better; no eggs; no cheese; does not drink milk  Low vitamin D; 26.3 in May 2017; fatigue  Never heard about appt in February from referral for irregular periods and ovarian cyst; not really much early satiety; eating fine  Depression screen [facility] 2/9 11/11/2016 08/12/2016 05/09/2016 02/12/2016 11/01/2015  Decreased Interest 0 0 1 0 0  Down, Depressed, Hopeless 1 1 1 1 1   PHQ - 2 Score 1 1 2 1 1   Altered sleeping - - 1 - -  Tired, decreased energy - - 1 - -  Change in appetite - - 1 - -  Feeling bad or failure about yourself  - - 1 - -  Trouble concentrating - - 1 - -  Moving slowly or fidgety/restless - - 1 - -  Suicidal thoughts - - 0 - -  PHQ-9 Score - - 8 - -  Difficult doing work/chores - - Not difficult at all - -   Relevant past medical, surgical, family and social history reviewed Past Medical History:  Diagnosis Date  . Anxiety   . Benign essential tremor    right side  . Depression   . Fibromyalgia   . Herniated lumbar intervertebral disc   . Hypertension    history of blood pressure  . Ovarian cyst 08/21/2016   One septated cyst, one simple cyst both on left ovary Feb 2018  . Psoriasis   . Thyroid disease   . Type 2 diabetes mellitus (HCC) 11/29/2015  .  Vitamin D deficiency 11/01/2015   Past Surgical History:  Procedure Laterality Date  . CESAREAN SECTION     2   Family History  Problem Relation Age of Onset  . Cancer Mother        lung  . Alzheimer's disease Father   . Dementia Father   . Parkinson's disease Father   . Drug abuse Sister   . Thyroid disease Sister   . Thyroid disease Sister   . Thyroid disease Sister   . Thyroid disease Sister   . Cancer Maternal Grandmother   . Cancer Maternal Grandfather    Social History   Social History  . Marital status: Single    Spouse name: N/A  . Number of children: N/A  . Years of education: N/A   Occupational History  . Not on file.   Social History Main Topics  . Smoking status: Never Smoker  . Smokeless tobacco: Never Used  . Alcohol use No  . Drug use: No  . Sexual activity: No   Other Topics Concern  . Not on file   Social History Narrative  .  No narrative on file   Interim medical history since last visit reviewed. Allergies and medications reviewed  Review of Systems Per HPI unless specifically indicated above     Objective:    BP 120/72   Pulse 71   Temp 97.4 F (36.3 C) (Oral)   Resp 14   Wt 151 lb 4.8 oz (68.6 kg)   LMP  (LMP Unknown)   SpO2 93%   BMI 27.67 kg/m   Wt Readings from Last 3 Encounters:  11/11/16 151 lb 4.8 oz (68.6 kg)  08/12/16 158 lb (71.7 kg)  07/23/16 155 lb (70.3 kg)    Physical Exam  Constitutional: She appears well-developed and well-nourished. No distress.  Weight down almost 7 pounds over last 3 months  HENT:  Head: Normocephalic and atraumatic.  Eyes: EOM are normal. No scleral icterus.  Neck: No JVD present.  Cardiovascular: Normal rate.   Pulmonary/Chest: Effort normal.  Abdominal: Soft. She exhibits no distension.  Musculoskeletal: She exhibits no edema.  Neurological: She is alert.  Skin: Skin is warm and dry. She is not diaphoretic. No pallor.  Psychiatric: She has a normal mood and affect. Her behavior  is normal. Judgment and thought content normal.   Diabetic Foot Form - Detailed   Diabetic Foot Exam - detailed Diabetic Foot exam was performed with the following findings:  Yes 11/11/2016  9:50 AM  Visual Foot Exam completed.:  Yes  Are the toenails ingrown?:  No Normal Range of Motion:  Yes Pulse Foot Exam completed.:  Yes  Right Dorsalis Pedis:  Present Left Dorsalis Pedis:  Present  Sensory Foot Exam Completed.:  Yes Swelling:  No Semmes-Weinstein Monofilament Test R Site 1-Great Toe:  Pos L Site 1-Great Toe:  Pos  R Site 4:  Pos L Site 4:  Pos  R Site 5:  Pos L Site 5:  Pos       Results for orders placed or performed in visit on 08/12/16  Hemoglobin A1c  Result Value Ref Range   Hgb A1c MFr Bld 8.2 (H) <5.7 %   Mean Plasma Glucose 189 mg/dL  Comprehensive metabolic panel  Result Value Ref Range   Sodium 137 135 - 146 mmol/L   Potassium 4.7 3.5 - 5.3 mmol/L   Chloride 101 98 - 110 mmol/L   CO2 28 20 - 31 mmol/L   Glucose, Bld 330 (H) 65 - 99 mg/dL   BUN 8 7 - 25 mg/dL   Creat 1.61 0.96 - 0.45 mg/dL   Total Bilirubin 0.4 0.2 - 1.2 mg/dL   Alkaline Phosphatase 79 33 - 115 U/L   AST 26 10 - 35 U/L   ALT 41 (H) 6 - 29 U/L   Total Protein 6.6 6.1 - 8.1 g/dL   Albumin 4.0 3.6 - 5.1 g/dL   Calcium 9.6 8.6 - 40.9 mg/dL  Lipid panel  Result Value Ref Range   Cholesterol 215 (H) <200 mg/dL   Triglycerides 811 (H) <150 mg/dL   HDL 50 (L) >91 mg/dL   Total CHOL/HDL Ratio 4.3 <5.0 Ratio   VLDL 58 (H) <30 mg/dL   LDL Cholesterol 478 (H) <100 mg/dL  TSH  Result Value Ref Range   TSH 8.59 (H) mIU/L  nonfasting labs above     Assessment & Plan:   Problem List Items Addressed This Visit      Endocrine   Type 2 diabetes mellitus (HCC)    Eye exam UTD; foot exam today; praise given for weight loss  and healthier eating; strong genetic component to diabetes; check A1c      Relevant Orders   COMPLETE METABOLIC PANEL WITH GFR   Hemoglobin A1c   Hypothyroidism, adult     Last thyroid abnormal; patient feels current dose is off; check today      Relevant Orders   TSH     Other   Vitamin D deficiency    Check level and replace as needed      Relevant Orders   VITAMIN D 25 Hydroxy (Vit-D Deficiency, Fractures)   Hyperlipidemia LDL goal <100    Check lipids fasting today; limit saturated fats      Relevant Orders   Lipid panel   Encounter for medication monitoring    Check liver and kidneys      Relevant Orders   COMPLETE METABOLIC PANEL WITH GFR      Follow up plan: Return in about 3 months (around 02/11/2017) for twenty minute follow-up with fasting labs.  An after-visit summary was printed and given to the patient at check-out.  Please see the patient instructions which may contain other information and recommendations beyond what is mentioned above in the assessment and plan.  No orders of the defined types were placed in this encounter.  Orders Placed This Encounter  Procedures  . COMPLETE METABOLIC PANEL WITH GFR  . Hemoglobin A1c  . Lipid panel  . VITAMIN D 25 Hydroxy (Vit-D Deficiency, Fractures)  . TSH

## 2016-11-11 NOTE — Assessment & Plan Note (Signed)
Last thyroid abnormal; patient feels current dose is off; check today

## 2016-11-11 NOTE — Assessment & Plan Note (Signed)
Eye exam UTD; foot exam today; praise given for weight loss and healthier eating; strong genetic component to diabetes; check A1c

## 2016-11-11 NOTE — Patient Instructions (Signed)
Please follow-up with Tracy Stephens up front about the referral placed in February Try to limit saturated fats in your diet (bologna, hot dogs, barbeque, cheeseburgers, hamburgers, steak, bacon, sausage, cheese, etc.) and get more fresh fruits, vegetables, and whole grains Please do see your eye doctor regularly, and have your eyes examined every year (or more often per his or her recommendation) Check your feet every night and let me know right away of any sores, infections, numbness, etc. Try to limit sweets, white bread, white rice, white potatoes It is okay with me for you to not check your fingerstick blood sugars (per [employer] Best Practices), unless you are interested and feel it would be helpful for you

## 2016-11-11 NOTE — Assessment & Plan Note (Signed)
Check level and replace as needed 

## 2016-11-11 NOTE — Assessment & Plan Note (Signed)
Check lipids (fasting today); limit saturated fats 

## 2016-11-11 NOTE — Assessment & Plan Note (Signed)
Check liver and kidneys 

## 2016-11-12 LAB — HEMOGLOBIN A1C
HEMOGLOBIN A1C: 6.7 % — AB (ref <5.7)
MEAN PLASMA GLUCOSE: 146 mg/dL

## 2016-11-12 LAB — TSH: TSH: 0.02 m[IU]/L — AB

## 2016-11-12 LAB — VITAMIN D 25 HYDROXY (VIT D DEFICIENCY, FRACTURES): VIT D 25 HYDROXY: 48 ng/mL (ref 30–100)

## 2016-11-13 ENCOUNTER — Other Ambulatory Visit: Payer: Self-pay | Admitting: Family Medicine

## 2016-11-13 MED ORDER — LEVOTHYROXINE SODIUM 137 MCG PO TABS: ORAL_TABLET | ORAL | 1 refills | Status: DC

## 2016-11-13 MED ORDER — LEVOTHYROXINE SODIUM 125 MCG PO TABS: ORAL_TABLET | ORAL | 1 refills | Status: DC

## 2016-11-13 NOTE — Progress Notes (Signed)
Change dose of thyroid medicine again; orders to CMA to recheck TSH and check free T4 in 6 weeks

## 2016-11-19 ENCOUNTER — Other Ambulatory Visit: Payer: Self-pay | Admitting: Family Medicine

## 2016-11-19 NOTE — Telephone Encounter (Signed)
Last lipids and sgpt reviewed; Rx approved 

## 2017-01-12 ENCOUNTER — Other Ambulatory Visit: Payer: Self-pay | Admitting: Family Medicine

## 2017-01-22 ENCOUNTER — Other Ambulatory Visit: Payer: Self-pay | Admitting: Family Medicine

## 2017-01-22 NOTE — Telephone Encounter (Signed)
Patient's last vit D was back up in the normal range She can just take 600 to 1,000 iu of OTC vitamin D3 once a day now

## 2017-01-23 ENCOUNTER — Other Ambulatory Visit: Payer: Self-pay | Admitting: Family Medicine

## 2017-01-23 NOTE — Telephone Encounter (Signed)
Left detailed voicemail

## 2017-01-29 ENCOUNTER — Other Ambulatory Visit: Payer: Self-pay | Admitting: Family Medicine

## 2017-01-29 NOTE — Telephone Encounter (Signed)
Refill request for Rx vit D; last vit D normal; DENIED

## 2017-02-23 ENCOUNTER — Other Ambulatory Visit: Payer: Self-pay | Admitting: Family Medicine

## 2017-02-23 DIAGNOSIS — E038 Other specified hypothyroidism: Secondary | ICD-10-CM

## 2017-02-24 NOTE — Telephone Encounter (Signed)
Marijean NiemannJaime,   Last labs were abnormal and she is on a new dose, please ask patient to come in for TSH and we will refill medication  Thank you

## 2017-02-25 ENCOUNTER — Other Ambulatory Visit: Payer: Self-pay

## 2017-02-25 DIAGNOSIS — E038 Other specified hypothyroidism: Secondary | ICD-10-CM

## 2017-02-25 NOTE — Telephone Encounter (Signed)
Patient notified

## 2017-03-06 ENCOUNTER — Other Ambulatory Visit: Payer: Self-pay | Admitting: Family Medicine

## 2017-03-18 ENCOUNTER — Other Ambulatory Visit: Payer: Self-pay | Admitting: Family Medicine

## 2017-03-18 NOTE — Telephone Encounter (Signed)
Last Cr reviewed; Rx approved 

## 2017-03-25 DIAGNOSIS — E038 Other specified hypothyroidism: Secondary | ICD-10-CM | POA: Diagnosis not present

## 2017-03-25 LAB — TSH: TSH: 0.67 mIU/L

## 2017-03-26 ENCOUNTER — Other Ambulatory Visit: Payer: Self-pay

## 2017-03-26 ENCOUNTER — Other Ambulatory Visit: Payer: Self-pay | Admitting: Family Medicine

## 2017-03-26 MED ORDER — LEVOTHYROXINE SODIUM 137 MCG PO TABS: ORAL_TABLET | ORAL | 2 refills | Status: DC

## 2017-03-26 MED ORDER — METFORMIN HCL ER 500 MG PO TB24: 1000.0000 mg | ORAL_TABLET | Freq: Two times a day (BID) | ORAL | 2 refills | Status: DC

## 2017-03-26 MED ORDER — LEVOTHYROXINE SODIUM 125 MCG PO TABS: ORAL_TABLET | ORAL | 2 refills | Status: DC

## 2017-03-26 NOTE — Telephone Encounter (Signed)
Last A1c and Cr reviewed; Rx approved 

## 2017-03-26 NOTE — Progress Notes (Signed)
Continue Rx, recheck thyroid if symptoms change

## 2017-06-17 ENCOUNTER — Other Ambulatory Visit: Payer: Self-pay | Admitting: Family Medicine

## 2017-06-18 ENCOUNTER — Telehealth: Payer: Self-pay | Admitting: Family Medicine

## 2017-06-18 NOTE — Telephone Encounter (Signed)
Attempted to reach patient, phone just rang, unable to leave a voicemail.

## 2017-06-18 NOTE — Telephone Encounter (Signed)
Patient is overdue for 6 month visit Please call her and ask her to schedule an appt in the next few weeks I'll refill her medicine Thank you

## 2017-06-18 NOTE — Telephone Encounter (Signed)
Attempted to reach patient, no answer, no option to leave a voicemail.   Kerman PasseyLada, Melinda P, MD 3 hours ago (10:16 AM)     Patient is overdue for 6 month visit Please call her and ask her to schedule an appt in the next few weeks I'll refill her medicine Thank you

## 2017-07-16 ENCOUNTER — Encounter: Payer: Self-pay | Admitting: Family Medicine

## 2017-07-16 ENCOUNTER — Ambulatory Visit (INDEPENDENT_AMBULATORY_CARE_PROVIDER_SITE_OTHER): Payer: Medicare Other | Admitting: Family Medicine

## 2017-07-16 DIAGNOSIS — E785 Hyperlipidemia, unspecified: Secondary | ICD-10-CM

## 2017-07-16 DIAGNOSIS — R251 Tremor, unspecified: Secondary | ICD-10-CM

## 2017-07-16 DIAGNOSIS — E119 Type 2 diabetes mellitus without complications: Secondary | ICD-10-CM

## 2017-07-16 DIAGNOSIS — Z5181 Encounter for therapeutic drug level monitoring: Secondary | ICD-10-CM | POA: Diagnosis not present

## 2017-07-16 DIAGNOSIS — E039 Hypothyroidism, unspecified: Secondary | ICD-10-CM | POA: Diagnosis not present

## 2017-07-16 MED ORDER — TRAZODONE HCL 100 MG PO TABS: 50.0000 mg | ORAL_TABLET | Freq: Every evening | ORAL | 5 refills | Status: DC | PRN

## 2017-07-16 NOTE — Progress Notes (Signed)
BP 118/82   Pulse 74   Temp 97.7 F (36.5 C) (Oral)   Resp 14   Wt 140 lb 3.2 oz (63.6 kg)   SpO2 99%   BMI 25.64 kg/m    Subjective:    Patient ID: Tracy Stephens, female    DOB: 01/29/1971, 47 y.o.   MRN: [id number]  HPI: Tracy Stephens is a 47 y.o. female  Chief Complaint  Patient presents with  . Follow-up  . Hypothyroidism    pt states the pharmacy has only been giving one dose instead of the 2 diffrent doses   HPI She has been working hard at weight loss; cut down significantly on sodas, eating salads Hypothyroidism; feels okay right now in terms of energy, stools; goes outside some, but so cold; taking the 125 mcg most of the time, has both the 137 and 125 now Vitamin D had come up from 26.3 to 48 last check; took Rx Type 2 diabetes; last A1c had dropped 1.5 points High cholesterol; dropped LDL 30 points between last two checks; no fatty meats; little bit of cheese on salad or tacos; not many eggs Trazodone for insomnia; sleeps really well she says Tremors, left now involved; not much caffeine, just one soda daily  [facility] 07/16/2017 11/11/2016 08/12/2016 05/09/2016 02/12/2016  Decreased Interest 0 0 0 1 0  Down, Depressed, Hopeless 0 1 1 1 1   PHQ - 2 Score 0 1 1 2 1   Altered sleeping - - - 1 -  Tired, decreased energy - - - 1 -  Change in appetite - - - 1 -  Feeling bad or failure about yourself  - - - 1 -  Trouble concentrating - - - 1 -  Moving slowly or fidgety/restless - - - 1 -  Suicidal thoughts - - - 0 -  PHQ-9 Score - - - 8 -  Difficult doing work/chores - - - Not difficult at all -   Relevant past medical, surgical, family and social history reviewed Past Medical History:  Diagnosis Date  . Anxiety   . Benign essential tremor    right side  . Depression   . Fibromyalgia   . Herniated lumbar intervertebral disc   . Hypertension    history of blood pressure  . Ovarian cyst 08/21/2016   One septated cyst, one simple cyst both on  left ovary Feb 2018  . Psoriasis   . Thyroid disease   . Type 2 diabetes mellitus (HCC) 11/29/2015  . Vitamin D deficiency 11/01/2015   Past Surgical History:  Procedure Laterality Date  . CESAREAN SECTION     2   Family History  Problem Relation Age of Onset  . Cancer Mother        lung  . Alzheimer's disease Father   . Dementia Father   . Parkinson's disease Father   . Drug abuse Sister   . Thyroid disease Sister   . Thyroid disease Sister   . Thyroid disease Sister   . Thyroid disease Sister   . Cancer Maternal Grandmother   . Cancer Maternal Grandfather    Social History   Tobacco Use  . Smoking status: Never Smoker  . Smokeless tobacco: Never Used  Substance Use Topics  . Alcohol use: No    Alcohol/week: 0.0 oz  . Drug use: No    Interim medical history since last visit reviewed. Allergies and medications reviewed  Review of Systems Per HPI unless specifically indicated above  Objective:    BP 118/82   Pulse 74   Temp 97.7 F (36.5 C) (Oral)   Resp 14   Wt 140 lb 3.2 oz (63.6 kg)   SpO2 99%   BMI 25.64 kg/m   Wt Readings from Last 3 Encounters:  07/16/17 140 lb 3.2 oz (63.6 kg)  11/11/16 151 lb 4.8 oz (68.6 kg)  08/12/16 158 lb (71.7 kg)    Physical Exam  Constitutional: She appears well-developed and well-nourished. No distress.  Weight down 18 pounds over last 11 months  HENT:  Head: Normocephalic and atraumatic.  Eyes: EOM are normal. No scleral icterus.  Neck: No JVD present.  Cardiovascular: Normal rate.  Pulmonary/Chest: Effort normal.  Abdominal: Soft. She exhibits no distension.  Musculoskeletal: She exhibits no edema.  Neurological: She is alert.  Skin: Skin is warm and dry. She is not diaphoretic. No pallor.  Psychiatric: She has a normal mood and affect. Her behavior is normal. Judgment and thought content normal.   Diabetic Foot Form - Detailed   Diabetic Foot Exam - detailed Diabetic Foot exam was performed with the  following findings:  Yes 07/16/2017 10:55 AM  Visual Foot Exam completed.:  Yes  Pulse Foot Exam completed.:  Yes  Right Dorsalis Pedis:  Present Left Dorsalis Pedis:  Present  Sensory Foot Exam Completed.:  Yes Semmes-Weinstein Monofilament Test R Site 1-Great Toe:  Pos L Site 1-Great Toe:  Pos           Assessment & Plan:   Problem List Items Addressed This Visit      Endocrine   Type 2 diabetes mellitus (HCC)    Check A1c; foot exam by MD; so proud of her weight loss and healthier diet; eye exam UTD      Relevant Orders   Hemoglobin A1c (Completed)   Microalbumin / creatinine urine ratio (Completed)   Hypothyroidism, adult    Balancing act, tryiing to maintain normal TSH      Relevant Orders   TSH (Completed)     Other   Tremors of nervous system    Taking propranolol      Hyperlipidemia LDL goal <100    Very impressed with last lipids; check today and expect even better; limit saturated fats      Relevant Orders   Lipid panel (Completed)   Encounter for medication monitoring    Check liver and kidneys      Relevant Orders   COMPLETE METABOLIC PANEL WITH GFR (Completed)      Follow up plan: Return in about 4 weeks (around 08/13/2017) for complete physical; and 6 months with Dr. Sherie Don.  An after-visit summary was printed and given to the patient at check-out.  Please see the patient instructions which may contain other information and recommendations beyond what is mentioned above in the assessment and plan.  Meds ordered this encounter  Medications  . traZODone (DESYREL) 100 MG tablet    Sig: Take 0.5-1 tablets (50-100 mg total) by mouth at bedtime as needed for sleep.    Dispense:  30 tablet    Refill:  5    Orders Placed This Encounter  Procedures  . COMPLETE METABOLIC PANEL WITH GFR  . Hemoglobin A1c  . TSH  . Microalbumin / creatinine urine ratio  . Lipid panel

## 2017-07-16 NOTE — Patient Instructions (Signed)
Please do see your eye doctor regularly, and have your eyes examined every year (or more often per his or her recommendation) Check your feet every night and let me know right away of any sores, infections, numbness, etc. Try to limit sweets, white bread, white rice, white potatoes It is okay with me for you to not check your fingerstick blood sugars (per [employer] Best Practices), unless you are interested and feel it would be helpful for you We'll get labs today If you have not heard anything from my staff in a week about any orders/referrals/studies from today, please contact us here to follow-up [phone]

## 2017-07-16 NOTE — Assessment & Plan Note (Signed)
Check A1c; foot exam by MD; so proud of her weight loss and healthier diet; eye exam UTD

## 2017-07-16 NOTE — Assessment & Plan Note (Signed)
Taking propranolol

## 2017-07-16 NOTE — Assessment & Plan Note (Signed)
Very impressed with last lipids; check today and expect even better; limit saturated fats

## 2017-07-16 NOTE — Assessment & Plan Note (Signed)
Balancing act, tryiing to maintain normal TSH

## 2017-07-16 NOTE — Assessment & Plan Note (Signed)
Check liver and kidneys 

## 2017-07-17 ENCOUNTER — Other Ambulatory Visit: Payer: Self-pay | Admitting: Family Medicine

## 2017-07-17 LAB — HEMOGLOBIN A1C
HEMOGLOBIN A1C: 6 %{Hb} — AB (ref <5.7)
Mean Plasma Glucose: 126 (calc)
eAG (mmol/L): 7 (calc)

## 2017-07-17 LAB — COMPLETE METABOLIC PANEL WITH GFR
AG Ratio: 1.3 (calc) (ref 1.0–2.5)
ALT: 25 U/L (ref 6–29)
AST: 20 U/L (ref 10–35)
Albumin: 4.2 g/dL (ref 3.6–5.1)
Alkaline phosphatase (APISO): 74 U/L (ref 33–115)
BUN: 10 mg/dL (ref 7–25)
CHLORIDE: 103 mmol/L (ref 98–110)
CO2: 29 mmol/L (ref 20–32)
Calcium: 10.1 mg/dL (ref 8.6–10.2)
Creat: 0.89 mg/dL (ref 0.50–1.10)
GFR, EST AFRICAN AMERICAN: 90 mL/min/{1.73_m2} (ref >=60)
GFR, Est Non African American: 78 mL/min/{1.73_m2} (ref >=60)
Globulin: 3.2 g/dL (calc) (ref 1.9–3.7)
Glucose, Bld: 105 mg/dL — ABNORMAL HIGH (ref 65–99)
Potassium: 5.2 mmol/L (ref 3.5–5.3)
Sodium: 138 mmol/L (ref 135–146)
TOTAL PROTEIN: 7.4 g/dL (ref 6.1–8.1)
Total Bilirubin: 0.6 mg/dL (ref 0.2–1.2)

## 2017-07-17 LAB — TSH: TSH: 0.28 mIU/L — ABNORMAL LOW

## 2017-07-17 LAB — MICROALBUMIN / CREATININE URINE RATIO
Creatinine, Urine: 204 mg/dL (ref 20–275)
MICROALB UR: 1.3 mg/dL
MICROALB/CREAT RATIO: 6 ug/mg{creat} (ref <30)

## 2017-07-17 LAB — LIPID PANEL
Cholesterol: 141 mg/dL (ref <200)
HDL: 52 mg/dL (ref >50)
LDL Cholesterol (Calc): 73 mg/dL (calc)
NON-HDL CHOLESTEROL (CALC): 89 mg/dL (ref <130)
Total CHOL/HDL Ratio: 2.7 (calc) (ref <5.0)
Triglycerides: 79 mg/dL (ref <150)

## 2017-07-17 MED ORDER — LEVOTHYROXINE SODIUM 125 MCG PO TABS: 125.0000 ug | ORAL_TABLET | Freq: Every day | ORAL | 2 refills | Status: DC

## 2017-07-18 ENCOUNTER — Telehealth: Payer: Self-pay

## 2017-07-18 NOTE — Telephone Encounter (Signed)
Called pt informed her of results below, per Dr.Lada. Pt gave verbal understanding. Also sent text for mychart sign up.

## 2017-07-18 NOTE — Telephone Encounter (Signed)
-----   Message from Kerman PasseyMelinda P Lada, MD sent at 07/17/2017  7:04 PM EST ----- Wow. Please let the patient know that I am so impressed with her lab results. Her hard work and weight loss efforts are really paying off! Her blood sugar was only 105, and her 3 month average dropped to 6. I'd like to have her STOP the Invokana, and just continue metformin. Her liver enzymes are normal. She is not spilling excessive protein through her kidneys (great news). Her cholesterol is fabulous! Lastly, I need to adjust her thyroid med just a little, so we'll go to just 125 mcg daily. Recheck TSH in 6-8 weeks (please ORDER TSH, Dx hypothyroidism). Thank you

## 2017-07-20 ENCOUNTER — Other Ambulatory Visit: Payer: Self-pay | Admitting: Family Medicine

## 2017-07-21 NOTE — Telephone Encounter (Signed)
137 mcg thyroid med requested; denied 150 mg trazodone requested; denied Approved the metformin

## 2017-07-25 ENCOUNTER — Other Ambulatory Visit: Payer: Self-pay | Admitting: Family Medicine

## 2017-07-25 NOTE — Telephone Encounter (Signed)
Reviewed last lipids and sgpt; Rx approved 

## 2017-07-29 ENCOUNTER — Other Ambulatory Visit: Payer: Self-pay

## 2017-07-29 ENCOUNTER — Telehealth: Payer: Self-pay | Admitting: Family Medicine

## 2017-07-29 MED ORDER — PROPRANOLOL HCL 20 MG PO TABS: 20.0000 mg | ORAL_TABLET | Freq: Two times a day (BID) | ORAL | 1 refills | Status: DC | PRN

## 2017-07-29 NOTE — Telephone Encounter (Signed)
Copied from CRM [phone]#[id number]. Topic: Quick Communication - Rx Refill/Question >> Jul 29, 2017 12:21 PM Zada GirtLander, [place] wrote: Medication: propranolol (INDERAL) 20 MG tablet  Has the patient contacted their pharmacy? Yes.    (Agent: If no, request that the patient contact the pharmacy for the refill.)  Preferred Pharmacy (with phone number or street name): [facility] [address] [facility] [place], [place] - [id number] [place] [id number] Phone: [phone] Fax: [phone]   Agent: Please be advised that RX refills may take up to 3 business days. We ask that you follow-up with your pharmacy.  Patient says her pharm said clinical staff told them the patient needs a CPE for this script, however there is no record that they requested the refill and no documentation that anyone refused the script. Please inform patient is appt is needed.

## 2017-08-15 ENCOUNTER — Ambulatory Visit (INDEPENDENT_AMBULATORY_CARE_PROVIDER_SITE_OTHER): Payer: Medicare Other | Admitting: Family Medicine

## 2017-08-15 ENCOUNTER — Encounter: Payer: Self-pay | Admitting: Family Medicine

## 2017-08-15 VITALS — BP 116/78 | HR 78 | Temp 98.3°F | Resp 14 | Ht 61.25 in | Wt 140.2 lb

## 2017-08-15 DIAGNOSIS — Z124 Encounter for screening for malignant neoplasm of cervix: Secondary | ICD-10-CM

## 2017-08-15 DIAGNOSIS — N898 Other specified noninflammatory disorders of vagina: Secondary | ICD-10-CM | POA: Diagnosis not present

## 2017-08-15 DIAGNOSIS — Z808 Family history of malignant neoplasm of other organs or systems: Secondary | ICD-10-CM

## 2017-08-15 DIAGNOSIS — N889 Noninflammatory disorder of cervix uteri, unspecified: Secondary | ICD-10-CM

## 2017-08-15 DIAGNOSIS — Z0001 Encounter for general adult medical examination with abnormal findings: Secondary | ICD-10-CM

## 2017-08-15 DIAGNOSIS — N888 Other specified noninflammatory disorders of cervix uteri: Secondary | ICD-10-CM

## 2017-08-15 DIAGNOSIS — N644 Mastodynia: Secondary | ICD-10-CM

## 2017-08-15 DIAGNOSIS — Z0000 Encounter for general adult medical examination without abnormal findings: Secondary | ICD-10-CM | POA: Insufficient documentation

## 2017-08-15 DIAGNOSIS — Z1239 Encounter for other screening for malignant neoplasm of breast: Secondary | ICD-10-CM

## 2017-08-15 DIAGNOSIS — Z809 Family history of malignant neoplasm, unspecified: Secondary | ICD-10-CM

## 2017-08-15 HISTORY — DX: Encounter for general adult medical examination without abnormal findings: Z00.00

## 2017-08-15 NOTE — Patient Instructions (Addendum)
We'll have you see the gynecologist to take a look at your cervix We'll get the breast imaging Health Maintenance, Female Adopting a healthy lifestyle and getting preventive care can go a long way to promote health and wellness. Talk with your health care provider about what schedule of regular examinations is right for you. This is a good chance for you to check in with your provider about disease prevention and staying healthy. In between checkups, there are plenty of things you can do on your own. Experts have done a lot of research about which lifestyle changes and preventive measures are most likely to keep you healthy. Ask your health care provider for more information. Weight and diet Eat a healthy diet  Be sure to include plenty of vegetables, fruits, low-fat dairy products, and lean protein.  Do not eat a lot of foods high in solid fats, added sugars, or salt.  Get regular exercise. This is one of the most important things you can do for your health. ? Most adults should exercise for at least 150 minutes each week. The exercise should increase your heart rate and make you sweat (moderate-intensity exercise). ? Most adults should also do strengthening exercises at least twice a week. This is in addition to the moderate-intensity exercise.  Maintain a healthy weight  Body mass index (BMI) is a measurement that can be used to identify possible weight problems. It estimates body fat based on height and weight. Your health care provider can help determine your BMI and help you achieve or maintain a healthy weight.  For females 59 years of age and older: ? A BMI below 18.5 is considered underweight. ? A BMI of 18.5 to 24.9 is normal. ? A BMI of 25 to 29.9 is considered overweight. ? A BMI of 30 and above is considered obese.  Watch levels of cholesterol and blood lipids  You should start having your blood tested for lipids and cholesterol at 47 years of age, then have this test every 5  years.  You may need to have your cholesterol levels checked more often if: ? Your lipid or cholesterol levels are high. ? You are older than 47 years of age. ? You are at high risk for heart disease.  Cancer screening Lung Cancer  Lung cancer screening is recommended for adults 73-74 years old who are at high risk for lung cancer because of a history of smoking.  A yearly low-dose CT scan of the lungs is recommended for people who: ? Currently smoke. ? Have quit within the past 15 years. ? Have at least a 30-pack-year history of smoking. A pack year is smoking an average of one pack of cigarettes a day for 1 year.  Yearly screening should continue until it has been 15 years since you quit.  Yearly screening should stop if you develop a health problem that would prevent you from having lung cancer treatment.  Breast Cancer  Practice breast self-awareness. This means understanding how your breasts normally appear and feel.  It also means doing regular breast self-exams. Let your health care provider know about any changes, no matter how small.  If you are in your 20s or 30s, you should have a clinical breast exam (CBE) by a health care provider every 1-3 years as part of a regular health exam.  If you are 20 or older, have a CBE every year. Also consider having a breast X-ray (mammogram) every year.  If you have a family history of  breast cancer, talk to your health care provider about genetic screening.  If you are at high risk for breast cancer, talk to your health care provider about having an MRI and a mammogram every year.  Breast cancer gene (BRCA) assessment is recommended for women who have family members with BRCA-related cancers. BRCA-related cancers include: ? Breast. ? Ovarian. ? Tubal. ? Peritoneal cancers.  Results of the assessment will determine the need for genetic counseling and BRCA1 and BRCA2 testing.  Cervical Cancer Your health care provider may  recommend that you be screened regularly for cancer of the pelvic organs (ovaries, uterus, and vagina). This screening involves a pelvic examination, including checking for microscopic changes to the surface of your cervix (Pap test). You may be encouraged to have this screening done every 3 years, beginning at age 21.  For women ages 30-65, health care providers may recommend pelvic exams and Pap testing every 3 years, or they may recommend the Pap and pelvic exam, combined with testing for human papilloma virus (HPV), every 5 years. Some types of HPV increase your risk of cervical cancer. Testing for HPV may also be done on women of any age with unclear Pap test results.  Other health care providers may not recommend any screening for nonpregnant women who are considered low risk for pelvic cancer and who do not have symptoms. Ask your health care provider if a screening pelvic exam is right for you.  If you have had past treatment for cervical cancer or a condition that could lead to cancer, you need Pap tests and screening for cancer for at least 20 years after your treatment. If Pap tests have been discontinued, your risk factors (such as having a new sexual partner) need to be reassessed to determine if screening should resume. Some women have medical problems that increase the chance of getting cervical cancer. In these cases, your health care provider may recommend more frequent screening and Pap tests.  Colorectal Cancer  This type of cancer can be detected and often prevented.  Routine colorectal cancer screening usually begins at 47 years of age and continues through 47 years of age.  Your health care provider may recommend screening at an earlier age if you have risk factors for colon cancer.  Your health care provider may also recommend using home test kits to check for hidden blood in the stool.  A small camera at the end of a tube can be used to examine your colon directly  (sigmoidoscopy or colonoscopy). This is done to check for the earliest forms of colorectal cancer.  Routine screening usually begins at age 50.  Direct examination of the colon should be repeated every 5-10 years through 47 years of age. However, you may need to be screened more often if early forms of precancerous polyps or small growths are found.  Skin Cancer  Check your skin from head to toe regularly.  Tell your health care provider about any new moles or changes in moles, especially if there is a change in a mole's shape or color.  Also tell your health care provider if you have a mole that is larger than the size of a pencil eraser.  Always use sunscreen. Apply sunscreen liberally and repeatedly throughout the day.  Protect yourself by wearing long sleeves, pants, a wide-brimmed hat, and sunglasses whenever you are outside.  Heart disease, diabetes, and high blood pressure  High blood pressure causes heart disease and increases the risk of stroke. High blood   pressure is more likely to develop in: ? People who have blood pressure in the high end of the normal range (130-139/85-89 mm Hg). ? People who are overweight or obese. ? People who are African American.  If you are 60-67 years of age, have your blood pressure checked every 3-5 years. If you are 95 years of age or older, have your blood pressure checked every year. You should have your blood pressure measured twice-once when you are at a hospital or clinic, and once when you are not at a hospital or clinic. Record the average of the two measurements. To check your blood pressure when you are not at a hospital or clinic, you can use: ? An automated blood pressure machine at a pharmacy. ? A home blood pressure monitor.  If you are between 59 years and 32 years old, ask your health care provider if you should take aspirin to prevent strokes.  Have regular diabetes screenings. This involves taking a blood sample to check your  fasting blood sugar level. ? If you are at a normal weight and have a low risk for diabetes, have this test once every three years after 47 years of age. ? If you are overweight and have a high risk for diabetes, consider being tested at a younger age or more often. Preventing infection Hepatitis B  If you have a higher risk for hepatitis B, you should be screened for this virus. You are considered at high risk for hepatitis B if: ? You were born in a country where hepatitis B is common. Ask your health care provider which countries are considered high risk. ? Your parents were born in a high-risk country, and you have not been immunized against hepatitis B (hepatitis B vaccine). ? You have HIV or AIDS. ? You use needles to inject street drugs. ? You live with someone who has hepatitis B. ? You have had sex with someone who has hepatitis B. ? You get hemodialysis treatment. ? You take certain medicines for conditions, including cancer, organ transplantation, and autoimmune conditions.  Hepatitis C  Blood testing is recommended for: ? Everyone born from [birth year] through 1965. ? Anyone with known risk factors for hepatitis C.  Sexually transmitted infections (STIs)  You should be screened for sexually transmitted infections (STIs) including gonorrhea and chlamydia if: ? You are sexually active and are younger than 47 years of age. ? You are older than 47 years of age and your health care provider tells you that you are at risk for this type of infection. ? Your sexual activity has changed since you were last screened and you are at an increased risk for chlamydia or gonorrhea. Ask your health care provider if you are at risk.  If you do not have HIV, but are at risk, it may be recommended that you take a prescription medicine daily to prevent HIV infection. This is called pre-exposure prophylaxis (PrEP). You are considered at risk if: ? You are sexually active and do not regularly use condoms  or know the HIV status of your partner(s). ? You take drugs by injection. ? You are sexually active with a partner who has HIV.  Talk with your health care provider about whether you are at high risk of being infected with HIV. If you choose to begin PrEP, you should first be tested for HIV. You should then be tested every 3 months for as long as you are taking PrEP. Pregnancy  If you are premenopausal  and you may become pregnant, ask your health care provider about preconception counseling.  If you may become pregnant, take 400 to 800 micrograms (mcg) of folic acid every day.  If you want to prevent pregnancy, talk to your health care provider about birth control (contraception). Osteoporosis and menopause  Osteoporosis is a disease in which the bones lose minerals and strength with aging. This can result in serious bone fractures. Your risk for osteoporosis can be identified using a bone density scan.  If you are 83 years of age or older, or if you are at risk for osteoporosis and fractures, ask your health care provider if you should be screened.  Ask your health care provider whether you should take a calcium or vitamin D supplement to lower your risk for osteoporosis.  Menopause may have certain physical symptoms and risks.  Hormone replacement therapy may reduce some of these symptoms and risks. Talk to your health care provider about whether hormone replacement therapy is right for you. Follow these instructions at home:  Schedule regular health, dental, and eye exams.  Stay current with your immunizations.  Do not use any tobacco products including cigarettes, chewing tobacco, or electronic cigarettes.  If you are pregnant, do not drink alcohol.  If you are breastfeeding, limit how much and how often you drink alcohol.  Limit alcohol intake to no more than 1 drink per day for nonpregnant women. One drink equals 12 ounces of beer, 5 ounces of wine, or 1 ounces of hard  liquor.  Do not use street drugs.  Do not share needles.  Ask your health care provider for help if you need support or information about quitting drugs.  Tell your health care provider if you often feel depressed.  Tell your health care provider if you have ever been abused or do not feel safe at home. This information is not intended to replace advice given to you by your health care provider. Make sure you discuss any questions you have with your health care provider. Document Released: 12/24/2010 Document Revised: 11/16/2015 Document Reviewed: 03/14/2015 Elsevier Interactive Patient Education  [employer].

## 2017-08-15 NOTE — Assessment & Plan Note (Signed)
Foot exam by MD today 

## 2017-08-15 NOTE — Assessment & Plan Note (Signed)
USPSTF grade A and B recommendations reviewed with patient; age-appropriate recommendations, preventive care, screening tests, etc discussed and encouraged; healthy living encouraged; see AVS for patient education given to patient  

## 2017-08-15 NOTE — Progress Notes (Signed)
BP 116/78   Pulse 78   Temp 98.3 F (36.8 C) (Oral)   Resp 14   Ht 5' 1.25" (1.556 m)   Wt 140 lb 3.2 oz (63.6 kg)   LMP  (LMP Unknown)   SpO2 96%   BMI 26.27 kg/m    Subjective:    Patient ID: Tracy Stephens, female    DOB: 06/21/1971, 47 y.o.   MRN: [id number]  HPI: [name] is a 47 y.o. female  Chief Complaint  Patient presents with  . Annual Exam    HPI USPSTF grade A and B recommendations Depression:  Depression screen [facility] 2/9 08/15/2017 07/16/2017 11/11/2016 08/12/2016 05/09/2016  Decreased Interest 0 0 0 0 1  Down, Depressed, Hopeless 0 0 '1 1 1  ' PHQ - 2 Score 0 0 '1 1 2  ' Altered sleeping - - - - 1  Tired, decreased energy - - - - 1  Change in appetite - - - - 1  Feeling bad or failure about yourself  - - - - 1  Trouble concentrating - - - - 1  Moving slowly or fidgety/restless - - - - 1  Suicidal thoughts - - - - 0  PHQ-9 Score - - - - 8  Difficult doing work/chores - - - - Not difficult at all   Hypertension: well-controlled BP Readings from Last 3 Encounters:  08/15/17 116/78  07/16/17 118/82  11/11/16 120/72   Obesity: stable weight Wt Readings from Last 3 Encounters:  08/15/17 140 lb 3.2 oz (63.6 kg)  07/16/17 140 lb 3.2 oz (63.6 kg)  11/11/16 151 lb 4.8 oz (68.6 kg)   BMI Readings from Last 3 Encounters:  08/15/17 26.27 kg/m  07/16/17 25.64 kg/m  11/11/16 27.67 kg/m    Skin cancer: no worrisome moles Lung cancer:  N/a; mother died from lung cancer; smoker Breast cancer: tender spots in the breast, no outright lumps; no breast discharge; tender spots not new Colorectal cancer: no blood in the stool; no first degree with colon cancer Cervical cancer screening: done today; last 11 years ago or so; had hx of abnormal paps in the past BRCA gene screening: family hx of breast and/or ovarian cancer and/or metastatic prostate cancer? materanal aunt had breast cancer; PGM had some kind of cancer, PGF had it too, not sure what kind; offered  generic testing; she'll think about it, then decided to see geneticist HIV, hep B, hep C: already done STD testing and prevention (chl/gon/syphilis): Chl/GC today Intimate partner violence: no abuse Contraception: not sexually active Osteoporosis: n/a Fall prevention/vitamin D: discussed Immunizations: just had flu, maybe allergic reaction Diet: fruits and veggies Exercise: no regular exercise, will get outdoors when weather is better Alcohol: no Tobacco use: no AAA: n/a Aspirin: no Glucose:  Glucose, Bld  Date Value Ref Range Status  07/16/2017 105 (H) 65 - 99 mg/dL Final    Comment:    .            Fasting reference interval . For someone without known diabetes, a glucose value between 100 and 125 mg/dL is consistent with prediabetes and should be confirmed with a follow-up test. .   11/11/2016 135 (H) 65 - 99 mg/dL Final  08/12/2016 330 (H) 65 - 99 mg/dL Final   Lipids:  Lab Results  Component Value Date   CHOL 141 07/16/2017   CHOL 142 11/11/2016   CHOL 215 (H) 08/12/2016   Lab Results  Component Value Date   HDL 52 07/16/2017  HDL 35 (L) 11/11/2016   HDL 50 (L) 08/12/2016   Lab Results  Component Value Date   LDLCALC 77 11/11/2016   LDLCALC 107 (H) 08/12/2016   LDLCALC 110 (H) 05/09/2016   Lab Results  Component Value Date   TRIG 79 07/16/2017   TRIG 152 (H) 11/11/2016   TRIG 291 (H) 08/12/2016   Lab Results  Component Value Date   CHOLHDL 2.7 07/16/2017   CHOLHDL 4.1 11/11/2016   CHOLHDL 4.3 08/12/2016   No results found for: LDLDIRECT   Depression screen [facility] 2/9 08/15/2017 07/16/2017 11/11/2016 08/12/2016 05/09/2016  Decreased Interest 0 0 0 0 1  Down, Depressed, Hopeless 0 0 '1 1 1  ' PHQ - 2 Score 0 0 '1 1 2  ' Altered sleeping - - - - 1  Tired, decreased energy - - - - 1  Change in appetite - - - - 1  Feeling bad or failure about yourself  - - - - 1  Trouble concentrating - - - - 1  Moving slowly or fidgety/restless - - - - 1  Suicidal  thoughts - - - - 0  PHQ-9 Score - - - - 8  Difficult doing work/chores - - - - Not difficult at all    Relevant past medical, surgical, family and social history reviewed Past Medical History:  Diagnosis Date  . Anxiety   . Benign essential tremor    right side  . Depression   . Fibromyalgia   . Herniated lumbar intervertebral disc   . Hypertension    history of blood pressure  . Ovarian cyst 08/21/2016   One septated cyst, one simple cyst both on left ovary Feb 2018  . Psoriasis   . Thyroid disease   . Type 2 diabetes mellitus ([place]) 11/29/2015  . Vitamin D deficiency 11/01/2015   Past Surgical History:  Procedure Laterality Date  . CESAREAN SECTION     2   Family History  Problem Relation Age of Onset  . Cancer Mother        lung  . Alzheimer's disease Father   . Dementia Father   . Parkinson's disease Father   . Drug abuse Sister   . Thyroid disease Sister   . Thyroid disease Sister   . Thyroid disease Sister   . Thyroid disease Sister   . Cancer Maternal Grandmother   . Cancer Maternal Grandfather    Social History   Tobacco Use  . Smoking status: Never Smoker  . Smokeless tobacco: Never Used  Substance Use Topics  . Alcohol use: No    Alcohol/week: 0.0 oz  . Drug use: No    Interim medical history since last visit reviewed. Allergies and medications reviewed  Review of Systems Per HPI unless specifically indicated above     Objective:    BP 116/78   Pulse 78   Temp 98.3 F (36.8 C) (Oral)   Resp 14   Ht 5' 1.25" (1.556 m)   Wt 140 lb 3.2 oz (63.6 kg)   LMP  (LMP Unknown)   SpO2 96%   BMI 26.27 kg/m   Wt Readings from Last 3 Encounters:  08/15/17 140 lb 3.2 oz (63.6 kg)  07/16/17 140 lb 3.2 oz (63.6 kg)  11/11/16 151 lb 4.8 oz (68.6 kg)    Physical Exam  Constitutional: She appears well-developed and well-nourished.  HENT:  Head: Normocephalic and atraumatic.  Eyes: Conjunctivae and EOM are normal. Right eye exhibits no hordeolum. Left  eye exhibits  no hordeolum. No scleral icterus.  Neck: Carotid bruit is not present. No thyromegaly present.  Cardiovascular: Normal rate, regular rhythm, S1 normal, S2 normal and normal heart sounds.  No extrasystoles are present.  Pulmonary/Chest: Effort normal and breath sounds normal. No respiratory distress. Right breast exhibits tenderness. Right breast exhibits no inverted nipple, no mass, no nipple discharge and no skin change. Left breast exhibits tenderness. Left breast exhibits no inverted nipple, no mass, no nipple discharge and no skin change. Breasts are symmetrical.  Both breasts have tender areas; RIGHT breast is under the areola; LEFT breast is lateral to areola  Abdominal: Soft. Normal appearance and bowel sounds are normal. She exhibits no distension, no abdominal bruit, no pulsatile midline mass and no mass. There is no hepatosplenomegaly. There is tenderness in the suprapubic area. No hernia.  Genitourinary: Uterus normal. Pelvic exam was performed with patient prone. There is no rash or lesion on the right labia. There is no rash or lesion on the left labia. Cervix exhibits discharge and friability. Cervix exhibits no motion tenderness. Right adnexum displays no mass, no tenderness and no fullness. Left adnexum displays no mass, no tenderness and no fullness. Vaginal discharge (moderate whitish discharge; no odor) found.  Genitourinary Comments: Cervix is very friable, bled easily with collection of pap with brush; cream to whitish tissue to the left around 3 to 5 o'clock  Musculoskeletal: Normal range of motion. She exhibits no edema.  Lymphadenopathy:       Head (right side): No submandibular adenopathy present.       Head (left side): No submandibular adenopathy present.    She has no cervical adenopathy.    She has no axillary adenopathy.  Neurological: She is alert. She displays no tremor. No cranial nerve deficit. She exhibits normal muscle tone. Gait normal.  Skin: Skin is  warm and dry. No bruising and no ecchymosis noted. No cyanosis. No pallor.  Psychiatric: Her speech is normal and behavior is normal. Thought content normal. Her mood appears not anxious. She does not exhibit a depressed mood.    Results for orders placed or performed in visit on 07/16/17  COMPLETE METABOLIC PANEL WITH GFR  Result Value Ref Range   Glucose, Bld 105 (H) 65 - 99 mg/dL   BUN 10 7 - 25 mg/dL   Creat 0.89 0.50 - 1.10 mg/dL   GFR, Est Non African American 78 > OR = 60 mL/min/1.62m   GFR, Est African American 90 > OR = 60 mL/min/1.727m  BUN/Creatinine Ratio NOT APPLICABLE 6 - 22 (calc)   Sodium 138 135 - 146 mmol/L   Potassium 5.2 3.5 - 5.3 mmol/L   Chloride 103 98 - 110 mmol/L   CO2 29 20 - 32 mmol/L   Calcium 10.1 8.6 - 10.2 mg/dL   Total Protein 7.4 6.1 - 8.1 g/dL   Albumin 4.2 3.6 - 5.1 g/dL   Globulin 3.2 1.9 - 3.7 g/dL (calc)   AG Ratio 1.3 1.0 - 2.5 (calc)   Total Bilirubin 0.6 0.2 - 1.2 mg/dL   Alkaline phosphatase (APISO) 74 33 - 115 U/L   AST 20 10 - 35 U/L   ALT 25 6 - 29 U/L  Hemoglobin A1c  Result Value Ref Range   Hgb A1c MFr Bld 6.0 (H) <5.7 % of total Hgb   Mean Plasma Glucose 126 (calc)   eAG (mmol/L) 7.0 (calc)  TSH  Result Value Ref Range   TSH 0.28 (L) mIU/L  Microalbumin / creatinine  urine ratio  Result Value Ref Range   Creatinine, Urine 204 20 - 275 mg/dL   Microalb, Ur 1.3 mg/dL   Microalb Creat Ratio 6 <30 mcg/mg creat  Lipid panel  Result Value Ref Range   Cholesterol 141 <200 mg/dL   HDL 52 >50 mg/dL   Triglycerides 79 <150 mg/dL   LDL Cholesterol (Calc) 73 mg/dL (calc)   Total CHOL/HDL Ratio 2.7 <5.0 (calc)   Non-HDL Cholesterol (Calc) 89 <130 mg/dL (calc)      Assessment & Plan:   Problem List Items Addressed This Visit      Other   Preventative health care - Primary    USPSTF grade A and B recommendations reviewed with patient; age-appropriate recommendations, preventive care, screening tests, etc discussed and  encouraged; healthy living encouraged; see AVS for patient education given to patient       Other Visit Diagnoses    Screening for breast cancer       Screening for cervical cancer       Relevant Orders   Pap IG, CT/NG NAA, and HPV (high risk)   Friable cervix       Relevant Orders   Ambulatory referral to Obstetrics / Gynecology   Abnormal appearance of cervix       Relevant Orders   WET PREP BY MOLECULAR PROBE   Pap IG, CT/NG NAA, and HPV (high risk)   Ambulatory referral to Obstetrics / Gynecology   Vaginal discharge       Relevant Orders   WET PREP BY MOLECULAR PROBE   Family history of cancer in mother       Relevant Orders   Ambulatory referral to Genetics   Breast pain       Relevant Orders   MM Digital Diagnostic Bilat   US BREAST LTD UNI LEFT INC AXILLA   US BREAST LTD UNI RIGHT INC AXILLA       Follow up plan: No Follow-up on file.  An after-visit summary was printed and given to the patient at [place].  Please see the patient instructions which may contain other information and recommendations beyond what is mentioned above in the assessment and plan.  No orders of the defined types were placed in this encounter.   Orders Placed This Encounter  Procedures  . WET PREP BY MOLECULAR PROBE  . MM Digital Diagnostic Bilat  . US BREAST LTD UNI LEFT INC AXILLA  . US BREAST LTD UNI RIGHT INC AXILLA  . Ambulatory referral to Genetics  . Ambulatory referral to Obstetrics / Gynecology

## 2017-08-16 ENCOUNTER — Other Ambulatory Visit: Payer: Self-pay | Admitting: Family Medicine

## 2017-08-16 LAB — WET PREP BY MOLECULAR PROBE
CANDIDA SPECIES: NOT DETECTED
MICRO NUMBER:: 90237469
SPECIMEN QUALITY: ADEQUATE
Trichomonas vaginosis: NOT DETECTED

## 2017-08-16 MED ORDER — METRONIDAZOLE 500 MG PO TABS: 500.0000 mg | ORAL_TABLET | Freq: Two times a day (BID) | ORAL | 0 refills | Status: DC

## 2017-08-16 NOTE — Progress Notes (Signed)
Rx for BV 

## 2017-08-18 ENCOUNTER — Telehealth: Payer: Self-pay

## 2017-08-18 ENCOUNTER — Telehealth: Payer: Self-pay | Admitting: Genetics

## 2017-08-18 ENCOUNTER — Encounter: Payer: Self-pay | Admitting: Genetics

## 2017-08-18 NOTE — Telephone Encounter (Signed)
-----   Message from Kerman PasseyMelinda P Lada, MD sent at 08/16/2017  3:42 PM EST ----- Please let the patient know that her wet mount did in fact show bacterial vaginosis; I've sent in a prescription that should take care of this; thank you

## 2017-08-18 NOTE — Telephone Encounter (Signed)
Scheduled appt with Dr. Katrinka BlazingSmith on Thursday 09/04/2017 @ 3:00pm. Pt made aware to arrive 30 mins before appt.

## 2017-08-18 NOTE — Telephone Encounter (Signed)
Called pt informed her of the information below per Dr.Lada. Pt gave verbal understanding.  

## 2017-08-20 LAB — PAP IG, CT-NG NAA, HPV HIGH-RISK
C. trachomatis RNA, TMA: NOT DETECTED
HPV DNA High Risk: NOT DETECTED
N. GONORRHOEAE RNA, TMA: NOT DETECTED

## 2017-08-21 ENCOUNTER — Telehealth: Payer: Self-pay

## 2017-08-21 NOTE — Telephone Encounter (Signed)
-----   Message from Kerman PasseyMelinda P Lada, MD sent at 08/20/2017  5:35 PM EST ----- Please let pt know that her pap smear did not show any infection (gonorrhea, chlamydia, or HPV). There were no cancerous or precancerous changes on her pap. We'll still have her see the gyncologist. Thank you

## 2017-08-21 NOTE — Telephone Encounter (Signed)
Called pt no answer. LM for pt informing her of neg-neg pap. Also sent text for mychart sign up.

## 2017-08-29 ENCOUNTER — Other Ambulatory Visit: Payer: Self-pay | Admitting: Family Medicine

## 2017-08-29 MED ORDER — TRAZODONE HCL 100 MG PO TABS: 50.0000 mg | ORAL_TABLET | Freq: Every evening | ORAL | 5 refills | Status: DC | PRN

## 2017-08-29 MED ORDER — PROPRANOLOL HCL 20 MG PO TABS: 20.0000 mg | ORAL_TABLET | Freq: Two times a day (BID) | ORAL | 1 refills | Status: DC | PRN

## 2017-08-29 NOTE — Telephone Encounter (Addendum)
Pt called about her trazadone - she says that when [facility] closed they did not transfer her rx to [facility].   She is asking for med to be called in.   [facility] [address] [place]  Pt is out of medication

## 2017-08-29 NOTE — Addendum Note (Signed)
Addended by: Landree Fernholz, Janit BernMELINDA P on: 08/29/2017 04:51 PM   Modules accepted: Orders

## 2017-08-29 NOTE — Addendum Note (Signed)
Addended by: Davene CostainGRAVES, Kahlel Peake C on: 08/29/2017 04:47 PM   Modules accepted: Orders

## 2017-08-29 NOTE — Telephone Encounter (Signed)
Trazodone Rx sent Wrong pharmacy was checked Rx sent again to correct pharmacy

## 2017-08-29 NOTE — Telephone Encounter (Signed)
Called pt informed her of the need to come by for labs. Pt gave verbal understanding. States that she may not be able to come in until the 14th due to other appts and work.

## 2017-08-29 NOTE — Telephone Encounter (Signed)
Copied from CRM [id number]. Topic: Quick Communication - Rx Refill/Question >> Aug 29, 2017  9:31 AM Tracy Stephens, Tracy Stephens, NT wrote: Medication: Trazodone 100 mg, Levothyroxine 125 mg, and Propronolol 20 mg   Has the patient contacted their pharmacy? Yes  Patient is calling because she needs a refill on the above medications. Stated that she has been in contact with her pharmacy but was told that they don't have any of her prescription due to the fact that she is using a different pharmacy now. Also stated that she needs a 30 day supply of the Levothyroxine 125 mg  Preferred Pharmacy (with phone number or street name): [facility] [address] [place] [phone]  Agent: Please be advised that RX refills may take up to 3 business days. We ask that you follow-up with your pharmacy.

## 2017-08-29 NOTE — Telephone Encounter (Signed)
Patient is overdue for labs Please see the note from January 2019 TSH I'll give short supply, but we really need to see the result to decide what dose she needs She should not be out of trazodone I sent the propranolol, but that should not have been out yet; find out how often she is using that please Thank you

## 2017-09-04 ENCOUNTER — Encounter: Payer: Medicare Other | Admitting: Genetics

## 2017-09-04 ENCOUNTER — Other Ambulatory Visit: Payer: Medicare Other

## 2017-09-11 ENCOUNTER — Other Ambulatory Visit: Payer: Self-pay

## 2017-09-11 DIAGNOSIS — E039 Hypothyroidism, unspecified: Secondary | ICD-10-CM | POA: Diagnosis not present

## 2017-09-12 ENCOUNTER — Other Ambulatory Visit: Payer: Self-pay | Admitting: Family Medicine

## 2017-09-12 ENCOUNTER — Telehealth: Payer: Self-pay

## 2017-09-12 LAB — TSH: TSH: 1.21 m[IU]/L

## 2017-09-12 MED ORDER — LEVOTHYROXINE SODIUM 125 MCG PO TABS: 125.0000 ug | ORAL_TABLET | Freq: Every day | ORAL | 5 refills | Status: DC

## 2017-09-12 NOTE — Progress Notes (Signed)
Rx sent 

## 2017-09-12 NOTE — Telephone Encounter (Signed)
Called pt informed her of the information below. Pt gave verbal understanding, notes that she did miss a week of her thyroid medication.

## 2017-09-12 NOTE — Telephone Encounter (Signed)
-----   Message from Kerman PasseyMelinda P Lada, MD sent at 09/12/2017 12:23 PM EDT ----- Please let the patient know that her thyroid test was fine; I've sent refills of the 125 mcg strength for six months; if s/s of hypo- or hyperthyroidism develop, call us

## 2017-09-25 ENCOUNTER — Telehealth: Payer: Self-pay

## 2017-09-25 NOTE — Telephone Encounter (Signed)
Received a refill request for pt Atorvastatin from [facility]. Pt received a prescription not to long ago for this [facility] [place]. Left detailed voicemail asking pt which pharmacy she wants to use for this medication.

## 2017-10-22 ENCOUNTER — Other Ambulatory Visit: Payer: Self-pay

## 2017-10-22 DIAGNOSIS — E119 Type 2 diabetes mellitus without complications: Secondary | ICD-10-CM | POA: Diagnosis not present

## 2017-10-22 LAB — HM DIABETES EYE EXAM

## 2017-11-19 ENCOUNTER — Other Ambulatory Visit: Payer: Self-pay | Admitting: Family Medicine

## 2018-01-13 ENCOUNTER — Ambulatory Visit: Payer: Medicare Other | Admitting: Family Medicine

## 2018-01-14 ENCOUNTER — Encounter: Payer: Self-pay | Admitting: Family Medicine

## 2018-01-14 ENCOUNTER — Ambulatory Visit (INDEPENDENT_AMBULATORY_CARE_PROVIDER_SITE_OTHER): Payer: Medicare Other | Admitting: Family Medicine

## 2018-01-14 VITALS — BP 126/74 | HR 75 | Temp 97.9°F | Resp 12 | Ht 61.0 in | Wt 147.0 lb

## 2018-01-14 DIAGNOSIS — M797 Fibromyalgia: Secondary | ICD-10-CM

## 2018-01-14 DIAGNOSIS — E785 Hyperlipidemia, unspecified: Secondary | ICD-10-CM | POA: Diagnosis not present

## 2018-01-14 DIAGNOSIS — G4700 Insomnia, unspecified: Secondary | ICD-10-CM | POA: Diagnosis not present

## 2018-01-14 DIAGNOSIS — E119 Type 2 diabetes mellitus without complications: Secondary | ICD-10-CM

## 2018-01-14 DIAGNOSIS — G8929 Other chronic pain: Secondary | ICD-10-CM

## 2018-01-14 DIAGNOSIS — E039 Hypothyroidism, unspecified: Secondary | ICD-10-CM | POA: Diagnosis not present

## 2018-01-14 DIAGNOSIS — M25511 Pain in right shoulder: Secondary | ICD-10-CM | POA: Diagnosis not present

## 2018-01-14 DIAGNOSIS — Z5181 Encounter for therapeutic drug level monitoring: Secondary | ICD-10-CM

## 2018-01-14 NOTE — Patient Instructions (Signed)
Do try to increase activity level a little bit at a time Try to improve your posture Stay hydrated and avoid excess heat Check out the information at familydoctor.org entitled "Nutrition for Weight Loss: What You Need to Know about Fad Diets" Try to lose between 1-2 pounds per week by taking in fewer calories and burning off more calories You can succeed by limiting portions, limiting foods dense in calories and fat, becoming more active, and drinking 8 glasses of water a day (64 ounces) Don't skip meals, especially breakfast, as skipping meals may alter your metabolism Do not use over-the-counter weight loss pills or gimmicks that claim rapid weight loss A healthy BMI (or body mass index) is between 18.5 and 24.9 You can calculate your ideal BMI at the NIH website [employer].huhttp://www.nhlbi.nih.gov/health/educational/lose_wt/BMI/bmicalc.htm

## 2018-01-14 NOTE — Assessment & Plan Note (Signed)
Limit saturated fats 

## 2018-01-14 NOTE — Assessment & Plan Note (Signed)
Encouraged adequate sleep; work on posture and core strengthening; try to build up walking gradually

## 2018-01-14 NOTE — Assessment & Plan Note (Addendum)
Foot exam by MD; check A1c; encouraged eye exam yearly; urine microalb:Cr UTD

## 2018-01-14 NOTE — Assessment & Plan Note (Signed)
Check kidneys 

## 2018-01-14 NOTE — Assessment & Plan Note (Signed)
Continue trazodone 

## 2018-01-14 NOTE — Progress Notes (Signed)
BP 126/74   Pulse 75   Temp 97.9 F (36.6 C) (Oral)   Resp 12   Ht 5\' 1"  (1.549 m)   Wt 147 lb (66.7 kg)   LMP 01/07/2018   SpO2 97%   BMI 27.78 kg/m    Subjective:    Patient ID: Tracy Stephens Dates, female    DOB: Nov 07, 1970, 47 y.o.   MRN: [id number]  HPI: Itati Brocksmith is a 47 y.o. female  Chief Complaint  Patient presents with  . Follow-up    HPI Patient is here for f/u today She has type 2 diabetes; last urine microalb:Cr UTD; eye exam UTD in May She is avoiding bread, simple carbs Not checking sugars, insurance won't pay for the strips Lab Results  Component Value Date   HGBA1C 6.0 (H) 07/16/2017   High cholesterol; no milk or eggs; limiting hot, bacon, sausage Lab Results  Component Value Date   CHOL 141 07/16/2017   HDL 52 07/16/2017   LDLCALC 73 07/16/2017   TRIG 79 07/16/2017   CHOLHDL 2.7 07/16/2017   Overweight; stuck in the house for the last month because of the heat; cannot drive  Propranolol working well  Insomnia; taking trazodone; working just okay, sometimes; some nights, trouble; goes to bed at 10 pm and wakes up at 6:30 am  Pains in the right shoulder for two months; really hurt for a while; sore to the touch, instant ow; getting some better; right-handed  Hypothyroidism Lab Results  Component Value Date   TSH 1.21 09/11/2017   Fibromyalgia; tried cymbalta, made her not care about what's going on her and slept a lot  Getting over cough, maybe viral bronchitis; daughter was sick too  Depression screen [facility] 2/9 01/14/2018 08/15/2017 07/16/2017 11/11/2016 08/12/2016  Decreased Interest 1 0 0 0 0  Down, Depressed, Hopeless 3 0 0 1 1  PHQ - 2 Score 4 0 0 1 1  Altered sleeping 3 - - - -  Tired, decreased energy 3 - - - -  Change in appetite 0 - - - -  Feeling bad or failure about yourself  0 - - - -  Trouble concentrating 0 - - - -  Moving slowly or fidgety/restless 0 - - - -  Suicidal thoughts 0 - - - -  PHQ-9 Score 10 - - - -    Difficult doing work/chores Somewhat difficult - - - -  MD note: no SI/HI  Relevant past medical, surgical, family and social history reviewed Past Medical History:  Diagnosis Date  . Anxiety   . Benign essential tremor    right side  . Depression   . Fibromyalgia   . Herniated lumbar intervertebral disc   . Hypertension    history of blood pressure  . Ovarian cyst 08/21/2016   One septated cyst, one simple cyst both on left ovary Feb 2018  . Psoriasis   . Thyroid disease   . Type 2 diabetes mellitus ([facility]) 11/29/2015  . Vitamin D deficiency 11/01/2015   Past Surgical History:  Procedure Laterality Date  . CESAREAN SECTION     2   Family History  Problem Relation Age of Onset  . Cancer Mother        lung  . Alzheimer's disease Father   . Dementia Father   . Parkinson's disease Father   . Drug abuse Sister   . Thyroid disease Sister   . Thyroid disease Sister   . Thyroid disease Sister   . Thyroid  disease Sister   . Cancer Maternal Grandmother   . Cancer Maternal Grandfather    Social History   Tobacco Use  . Smoking status: Never Smoker  . Smokeless tobacco: Never Used  Substance Use Topics  . Alcohol use: No    Alcohol/week: 0.0 oz  . Drug use: No    Interim medical history since last visit reviewed. Allergies and medications reviewed  Review of Systems Per HPI unless specifically indicated above     Objective:    BP 126/74   Pulse 75   Temp 97.9 F (36.6 C) (Oral)   Resp 12   Ht 5\' 1"  (1.549 m)   Wt 147 lb (66.7 kg)   LMP 01/07/2018   SpO2 97%   BMI 27.78 kg/m   Wt Readings from Last 3 Encounters:  01/14/18 147 lb (66.7 kg)  08/15/17 140 lb 3.2 oz (63.6 kg)  07/16/17 140 lb 3.2 oz (63.6 kg)    Physical Exam  Constitutional: She appears well-developed and well-nourished. No distress.  HENT:  Head: Normocephalic and atraumatic.  Eyes: EOM are normal. No scleral icterus.  Neck: No thyromegaly present.  Cardiovascular: Normal rate, regular  rhythm and normal heart sounds.  No murmur heard. Pulmonary/Chest: Effort normal and breath sounds normal. No respiratory distress. She has no wheezes.  Slouched posture  Abdominal: Soft. Bowel sounds are normal. She exhibits no distension.  Musculoskeletal: She exhibits no edema.       Right shoulder: She exhibits decreased range of motion and tenderness.  Neurological: She is alert. She exhibits normal muscle tone.  Skin: Skin is warm and dry. She is not diaphoretic. No pallor.  Psychiatric: She has a normal mood and affect. Her behavior is normal. Judgment and thought content normal. Her mood appears not anxious. She does not exhibit a depressed mood.    Results for orders placed or performed in visit on 10/22/17  HM DIABETES EYE EXAM  Result Value Ref Range   HM Diabetic Eye Exam No Retinopathy No Retinopathy      Assessment & Plan:   Problem List Items Addressed This Visit      Endocrine   Type 2 diabetes mellitus ([facility]) - Primary    Foot exam by MD; check A1c; encouraged eye exam yearly; urine microalb:Cr UTD      Relevant Orders   Hemoglobin A1C   Hypothyroidism, adult    Last TSH normal        Other   Insomnia    Continue trazodone      Hyperlipidemia LDL goal <100    Limit saturated fats      Relevant Orders   Lipid panel   Fibromyalgia    Encouraged adequate sleep; work on posture and core strengthening; try to build up walking gradually      Encounter for medication monitoring    Check kidneys      Relevant Orders   COMPLETE METABOLIC PANEL WITH GFR    Other Visit Diagnoses    Chronic right shoulder pain       refer to ortho       Follow up plan: Return in about 6 months (around 07/17/2018) for follow-up visit with Dr. Sherie Don.  An after-visit summary was printed and given to the patient at check-out.  Please see the patient instructions which may contain other information and recommendations beyond what is mentioned above in the assessment and  plan.  No orders of the defined types were placed in this encounter.  Orders Placed This Encounter  Procedures  . Hemoglobin A1C  . COMPLETE METABOLIC PANEL WITH GFR  . Lipid panel

## 2018-01-14 NOTE — Assessment & Plan Note (Signed)
Last TSH normal.  °

## 2018-01-15 LAB — COMPLETE METABOLIC PANEL WITH GFR
AG Ratio: 1.6 (calc) (ref 1.0–2.5)
ALBUMIN MSPROF: 4 g/dL (ref 3.6–5.1)
ALT: 13 U/L (ref 6–29)
AST: 15 U/L (ref 10–35)
Alkaline phosphatase (APISO): 69 U/L (ref 33–115)
BILIRUBIN TOTAL: 0.3 mg/dL (ref 0.2–1.2)
BUN: 14 mg/dL (ref 7–25)
CHLORIDE: 104 mmol/L (ref 98–110)
CO2: 25 mmol/L (ref 20–32)
Calcium: 9.2 mg/dL (ref 8.6–10.2)
Creat: 0.89 mg/dL (ref 0.50–1.10)
GFR, EST AFRICAN AMERICAN: 90 mL/min/{1.73_m2} (ref >=60)
GFR, EST NON AFRICAN AMERICAN: 78 mL/min/{1.73_m2} (ref >=60)
GLUCOSE: 122 mg/dL (ref 65–139)
Globulin: 2.5 g/dL (calc) (ref 1.9–3.7)
Potassium: 4.5 mmol/L (ref 3.5–5.3)
Sodium: 135 mmol/L (ref 135–146)
TOTAL PROTEIN: 6.5 g/dL (ref 6.1–8.1)

## 2018-01-15 LAB — LIPID PANEL
Cholesterol: 146 mg/dL (ref <200)
HDL: 51 mg/dL (ref >50)
LDL CHOLESTEROL (CALC): 68 mg/dL
NON-HDL CHOLESTEROL (CALC): 95 mg/dL (ref <130)
Total CHOL/HDL Ratio: 2.9 (calc) (ref <5.0)
Triglycerides: 199 mg/dL — ABNORMAL HIGH (ref <150)

## 2018-01-15 LAB — HEMOGLOBIN A1C
Hgb A1c MFr Bld: 5.9 % of total Hgb — ABNORMAL HIGH (ref <5.7)
Mean Plasma Glucose: 123 (calc)
eAG (mmol/L): 6.8 (calc)

## 2018-01-26 ENCOUNTER — Telehealth: Payer: Self-pay | Admitting: Family Medicine

## 2018-01-26 NOTE — Telephone Encounter (Signed)
Reviewed lab results and physician's note with patient. No result note forwarded to [facility].

## 2018-01-27 ENCOUNTER — Telehealth: Payer: Self-pay

## 2018-01-27 DIAGNOSIS — M25511 Pain in right shoulder: Principal | ICD-10-CM

## 2018-01-27 DIAGNOSIS — G8929 Other chronic pain: Secondary | ICD-10-CM

## 2018-01-27 NOTE — Telephone Encounter (Signed)
That's fine; please put in the referral if acute or chronic; thank you

## 2018-01-27 NOTE — Telephone Encounter (Signed)
Copied from CRM [id number]. Topic: Referral - Request >> Jan 26, 2018  3:50 PM Gaynelle AduPoole, Shalonda wrote: Reason for CRM: patient is calling to request a referral for right shoulder pain. Please advise

## 2018-01-27 NOTE — Telephone Encounter (Signed)
We can do allergy testing Please offer appointment with me or NP I don't see this in her problem list

## 2018-01-27 NOTE — Telephone Encounter (Signed)
Patient would also like referral for allergy testing?

## 2018-02-02 DIAGNOSIS — M7541 Impingement syndrome of right shoulder: Secondary | ICD-10-CM | POA: Diagnosis not present

## 2018-02-04 ENCOUNTER — Ambulatory Visit (INDEPENDENT_AMBULATORY_CARE_PROVIDER_SITE_OTHER): Payer: Medicare Other | Admitting: Family Medicine

## 2018-02-04 ENCOUNTER — Encounter: Payer: Self-pay | Admitting: Family Medicine

## 2018-02-04 DIAGNOSIS — J301 Allergic rhinitis due to pollen: Secondary | ICD-10-CM | POA: Diagnosis not present

## 2018-02-04 DIAGNOSIS — H1010 Acute atopic conjunctivitis, unspecified eye: Secondary | ICD-10-CM

## 2018-02-04 DIAGNOSIS — H1013 Acute atopic conjunctivitis, bilateral: Secondary | ICD-10-CM

## 2018-02-04 DIAGNOSIS — J309 Allergic rhinitis, unspecified: Secondary | ICD-10-CM | POA: Insufficient documentation

## 2018-02-04 HISTORY — DX: Acute atopic conjunctivitis, unspecified eye: H10.10

## 2018-02-04 NOTE — Patient Instructions (Addendum)
I've put in a referral for you to see the allergist  Allergic Conjunctivitis, Adult Allergic conjunctivitis is inflammation of the clear membrane that covers the white part of your eye and the inner surface of your eyelid (conjunctiva). The inflammation is caused by allergies. The blood vessels in the conjunctiva become inflamed and this causes the eyes to become red or pink. The eyes often feel itchy. Allergic conjunctivitis cannot be spread from one person to another person (is not contagious). What are the causes? This condition is caused by an allergic reaction. Common causes of an allergic reaction (allergens) include:  Outdoor allergens, such as: ? Pollen. ? Grass and weeds. ? Mold spores.  Indoor allergens, such as: ? Dust. ? Smoke. ? Mold. ? Pet dander. ? Animal hair.  What increases the risk? You may be more likely to develop this condition if you have a family history of allergies, such as:  Allergic rhinitis.  Bronchial asthma.  Atopic dermatitis.  What are the signs or symptoms? Symptoms of this condition include eyes that are:  Itchy.  Red.  Watery.  Puffy.  Your eyes may also:  Sting or burn.  Have clear drainage coming from them.  How is this diagnosed? This condition may be diagnosed by medical history and physical exam. If you have drainage from your eyes, it may be tested to rule out other causes of conjunctivitis. You may also need to see a health care provider who specializes in treating allergies (allergist) or eye conditions (ophthalmologist) for tests to confirm the diagnosis. You may have:  Skin tests to see which allergens are causing your symptoms. These tests involve pricking the skin with a tiny needle and exposing the skin to small amounts of potential allergens to see if your skin reacts.  Blood tests.  Tissue scrapings from your eyelid. These will be examined under a microscope.  How is this treated? Treatments for this condition  may include:  Cold cloths (compresses) to soothe itching and swelling.  Washing the face to remove allergens.  Eye drops. These may be prescription or over-the-counter. There are several different types. You may need to try different types to see which one works best for you. Your may need: ? Eye drops that block the allergic reaction (antihistamine). ? Eye drops that reduce swelling and irritation (anti-inflammatory). ? Steroid eye drops to lessen a severe reaction (vernal conjunctivitis).  Oral antihistamine medicines to reduce your allergic reaction. You may need these if eye drops do not help or are difficult to use.  Follow these instructions at home:  Avoid known allergens whenever possible.  Take or apply over-the-counter and prescription medicines only as told by your health care provider. These include any eye drops.  Apply a cool, clean washcloth to your eye for 10-20 minutes, 3-4 times a day.  Do not touch or rub your eyes.  Do not wear contact lenses until the inflammation is gone. Wear glasses instead.  Do not wear eye makeup until the inflammation is gone.  Keep all follow-up visits as told by your health care provider. This is important. Contact a health care provider if:  Your symptoms get worse or do not improve with treatment.  You have mild eye pain.  You have sensitivity to light.  You have spots or blisters on your eyes.  You have pus draining from your eye.  You have a fever. Get help right away if:  You have redness, swelling, or other symptoms in only one eye.  Your vision is blurred or you have vision changes.  You have severe eye pain. This information is not intended to replace advice given to you by your health care provider. Make sure you discuss any questions you have with your health care provider. Document Released: 08/31/2002 Document Revised: 02/07/2016 Document Reviewed: 12/22/2015 Elsevier Interactive Patient Education  2018  [employer] Allergic Rhinitis, Adult Allergic rhinitis is an allergic reaction that affects the mucous membrane inside the nose. It causes sneezing, a runny or stuffy nose, and the feeling of mucus going down the back of the throat (postnasal drip). Allergic rhinitis can be mild to severe. There are two types of allergic rhinitis:  Seasonal. This type is also called hay fever. It happens only during certain seasons.  Perennial. This type can happen at any time of the year.  What are the causes? This condition happens when the body's defense system (immune system) responds to certain harmless substances called allergens as though they were germs.  Seasonal allergic rhinitis is triggered by pollen, which can come from grasses, trees, and weeds. Perennial allergic rhinitis may be caused by:  House dust mites.  Pet dander.  Mold spores.  What are the signs or symptoms? Symptoms of this condition include:  Sneezing.  Runny or stuffy nose (nasal congestion).  Postnasal drip.  Itchy nose.  Tearing of the eyes.  Trouble sleeping.  Daytime sleepiness.  How is this diagnosed? This condition may be diagnosed based on:  Your medical history.  A physical exam.  Tests to check for related conditions, such as: ? Asthma. ? Pink eye. ? Ear infection. ? Upper respiratory infection.  Tests to find out which allergens trigger your symptoms. These may include skin or blood tests.  How is this treated? There is no cure for this condition, but treatment can help control symptoms. Treatment may include:  Taking medicines that block allergy symptoms, such as antihistamines. Medicine may be given as a shot, nasal spray, or pill.  Avoiding the allergen.  Desensitization. This treatment involves getting ongoing shots until your body becomes less sensitive to the allergen. This treatment may be done if other treatments do not help.  If taking medicine and avoiding the allergen does  not work, new, stronger medicines may be prescribed.  Follow these instructions at home:  Find out what you are allergic to. Common allergens include smoke, dust, and pollen.  Avoid the things you are allergic to. These are some things you can do to help avoid allergens: ? Replace carpet with wood, tile, or vinyl flooring. Carpet can trap dander and dust. ? Do not smoke. Do not allow smoking in your home. ? Change your heating and air conditioning filter at least once a month. ? During allergy season:  Keep windows closed as much as possible.  Plan outdoor activities when pollen counts are lowest. This is usually during the evening hours.  When coming indoors, change clothing and shower before sitting on furniture or bedding.  Take over-the-counter and prescription medicines only as told by your health care provider.  Keep all follow-up visits as told by your health care provider. This is important. Contact a health care provider if:  You have a fever.  You develop a persistent cough.  You make whistling sounds when you breathe (you wheeze).  Your symptoms interfere with your normal daily activities. Get help right away if:  You have shortness of breath. Summary  This condition can be managed by taking medicines as directed and  avoiding allergens.  Contact your health care provider if you develop a persistent cough or fever.  During allergy season, keep windows closed as much as possible. This information is not intended to replace advice given to you by your health care provider. Make sure you discuss any questions you have with your health care provider. Document Released: 03/05/2001 Document Revised: 07/18/2016 Document Reviewed: 07/18/2016 Elsevier Interactive Patient Education  [employer].

## 2018-02-04 NOTE — Assessment & Plan Note (Signed)
Refer to [place] per patient request for evaluation and testing; advised no antihistamines prior to appointment for testing; avoid triggers

## 2018-02-04 NOTE — Assessment & Plan Note (Addendum)
Refer to [place] per patient request for evaluation and testing; advised no antihistamines prior to appointment for testing; avoid triggers; keeping cat out of the bedroom

## 2018-02-04 NOTE — Progress Notes (Signed)
BP 122/64   Pulse 85   Temp 98.2 F (36.8 C)   Ht 5\' 2"  (1.575 m)   Wt 148 lb 1.6 oz (67.2 kg)   LMP 01/07/2018   SpO2 94%   BMI 27.09 kg/m    Subjective:    Patient ID: Tracy DatesKristine Klontz, female    DOB: 12/18/1970, 47 y.o.   MRN: [id number]  HPI: Tracy Stephens is a 47 y.o. female  Chief Complaint  Patient presents with  . Referral    Pt wants allergy testing    HPI She is here for allergies She is allergic to goldenrod, little yellow flowers, dandelions, pollen, stuff like that she knows because she sneezing; she gets wheezy with goldenrods Itchy watery eyes as well; right eye worse She has taken benadryl OTC, generic version She has never used any nasal spray She has never used any eye drops She gets hives with contact with allergens, sometimes just around things; went to the ER last year because of facial swelling with poison oak No hx of childhood asthma, but has had bronchitis as an adult  Daughter was tested a few months and was allergic to outside stuff; getting allergy shots; she is allergic to everything; her father does not have allergies  She saw the orthopaedist for her shoulder pain, had xrays done; had a shot in the shoulder; will start PT  Dep[facility] 01/14/2018 08/15/2017 07/16/2017 11/11/2016 08/12/2016  Decreased Interest 1 0 0 0 0  Down, Depressed, Hopeless 3 0 0 1 1  PHQ - 2 Score 4 0 0 1 1  Altered sleeping 3 - - - -  Tired, decreased energy 3 - - - -  Change in appetite 0 - - - -  Feeling bad or failure about yourself  0 - - - -  Trouble concentrating 0 - - - -  Moving slowly or fidgety/restless 0 - - - -  Suicidal thoughts 0 - - - -  PHQ-9 Score 10 - - - -  Difficult doing work/chores Somewhat difficult - - - -   MD note: she is feeling fine today; no SI; feeling better, has faith and has children, would not hurt herself  Relevant past medical, surgical, family and social history reviewed Past Medical History:  Diagnosis Date    . Anxiety   . Benign essential tremor    right side  . Depression   . Fibromyalgia   . Herniated lumbar intervertebral disc   . Hypertension    history of blood pressure  . Ovarian cyst 08/21/2016   One septated cyst, one simple cyst both on left ovary Feb 2018  . Psoriasis   . Thyroid disease   . Type 2 diabetes mellitus (HCC) 11/29/2015  . Vitamin D deficiency 11/01/2015   Past Surgical History:  Procedure Laterality Date  . CESAREAN SECTION     2   Family History  Problem Relation Age of Onset  . Cancer Mother        lung  . Alzheimer's disease Father   . Dementia Father   . Parkinson's disease Father   . Drug abuse Sister   . Thyroid disease Sister   . Thyroid disease Sister   . Thyroid disease Sister   . Thyroid disease Sister   . Cancer Maternal Grandmother   . Cancer Maternal Grandfather    Social History   Tobacco Use  . Smoking status: Never Smoker  . Smokeless tobacco: Never Used  Substance Use  Topics  . Alcohol use: No    Alcohol/week: 0.0 standard drinks  . Drug use: No    Interim medical history since last visit reviewed. Allergies and medications reviewed  Review of Systems Per HPI unless specifically indicated above     Objective:    BP 122/64   Pulse 85   Temp 98.2 F (36.8 C)   Ht 5\' 2"  (1.575 m)   Wt 148 lb 1.6 oz (67.2 kg)   LMP 01/07/2018   SpO2 94%   BMI 27.09 kg/m   Wt Readings from Last 3 Encounters:  02/04/18 148 lb 1.6 oz (67.2 kg)  01/14/18 147 lb (66.7 kg)  08/15/17 140 lb 3.2 oz (63.6 kg)    Physical Exam  Constitutional: She appears well-developed and well-nourished.  HENT:  Right Ear: A middle ear effusion is present.  Mouth/Throat: Mucous membranes are normal.  Irregular surface of LEFT TM consistent with previous injury (rupture); fluid behind RIGHT TM  Eyes: EOM are normal. No scleral icterus.  Cardiovascular: Normal rate and regular rhythm.  Pulmonary/Chest: Effort normal and breath sounds normal.  Skin:   Scale on external ears, neck from psoriasis  Psychiatric: She has a normal mood and affect. Her behavior is normal.      Assessment & Plan:   Problem List Items Addressed This Visit      Respiratory   Allergic rhinitis    Refer to [place] per patient request for evaluation and testing; advised no antihistamines prior to appointment for testing; avoid triggers      Relevant Orders   Ambulatory referral to Allergy     Other   Allergic conjunctivitis    Refer to  per patient request for evaluation and testing; advised no antihistamines prior to appointment for testing; avoid triggers; keeping cat out of the bedroom      Relevant Orders   Ambulatory referral to Allergy       Follow up plan: Return if symptoms worsen or fail to improve.  An after-visit summary was printed and given to the patient at check-out.  Please see the patient instructions which may contain other information and recommendations beyond what is mentioned above in the assessment and plan.  No orders of the defined types were placed in this encounter.   Orders Placed This Encounter  Procedures  . Ambulatory referral to Allergy

## 2018-02-11 DIAGNOSIS — M6281 Muscle weakness (generalized): Secondary | ICD-10-CM | POA: Diagnosis not present

## 2018-02-11 DIAGNOSIS — M25511 Pain in right shoulder: Secondary | ICD-10-CM | POA: Diagnosis not present

## 2018-02-17 ENCOUNTER — Other Ambulatory Visit: Payer: Self-pay | Admitting: Family Medicine

## 2018-02-17 ENCOUNTER — Other Ambulatory Visit: Payer: Self-pay | Admitting: Nurse Practitioner

## 2018-02-17 NOTE — Telephone Encounter (Signed)
Last visit 7/24

## 2018-02-19 DIAGNOSIS — M6281 Muscle weakness (generalized): Secondary | ICD-10-CM | POA: Diagnosis not present

## 2018-02-19 DIAGNOSIS — M25511 Pain in right shoulder: Secondary | ICD-10-CM | POA: Diagnosis not present

## 2018-02-25 ENCOUNTER — Other Ambulatory Visit: Payer: Self-pay | Admitting: Nurse Practitioner

## 2018-02-25 ENCOUNTER — Other Ambulatory Visit: Payer: Self-pay | Admitting: Family Medicine

## 2018-02-25 NOTE — Telephone Encounter (Signed)
Last OV: 02/04/18 Next: 07/17/18

## 2018-03-05 DIAGNOSIS — J3089 Other allergic rhinitis: Secondary | ICD-10-CM | POA: Diagnosis not present

## 2018-03-05 DIAGNOSIS — J309 Allergic rhinitis, unspecified: Secondary | ICD-10-CM | POA: Diagnosis not present

## 2018-03-05 DIAGNOSIS — R05 Cough: Secondary | ICD-10-CM | POA: Diagnosis not present

## 2018-03-05 DIAGNOSIS — J3081 Allergic rhinitis due to animal (cat) (dog) hair and dander: Secondary | ICD-10-CM | POA: Diagnosis not present

## 2018-03-12 DIAGNOSIS — J3081 Allergic rhinitis due to animal (cat) (dog) hair and dander: Secondary | ICD-10-CM | POA: Diagnosis not present

## 2018-03-12 DIAGNOSIS — J3089 Other allergic rhinitis: Secondary | ICD-10-CM | POA: Diagnosis not present

## 2018-03-18 DIAGNOSIS — M5412 Radiculopathy, cervical region: Secondary | ICD-10-CM | POA: Diagnosis not present

## 2018-03-19 ENCOUNTER — Other Ambulatory Visit: Payer: Self-pay | Admitting: Family Medicine

## 2018-03-23 ENCOUNTER — Other Ambulatory Visit: Payer: Self-pay | Admitting: Family Medicine

## 2018-03-23 MED ORDER — PROPRANOLOL HCL 20 MG PO TABS: 20.0000 mg | ORAL_TABLET | Freq: Two times a day (BID) | ORAL | 2 refills | Status: DC | PRN

## 2018-03-23 NOTE — Telephone Encounter (Signed)
Copied from CRM [id number]. Topic: General - Other >> Mar 23, 2018 11:29 AM Leafy Ro wrote: Reason for CRM: pt said the pharm said to call us. Pt needs refill on propanolol 20 mg. [facility] in [place] on [address]

## 2018-03-24 DIAGNOSIS — J3089 Other allergic rhinitis: Secondary | ICD-10-CM | POA: Diagnosis not present

## 2018-03-24 DIAGNOSIS — J3081 Allergic rhinitis due to animal (cat) (dog) hair and dander: Secondary | ICD-10-CM | POA: Diagnosis not present

## 2018-03-30 ENCOUNTER — Other Ambulatory Visit: Payer: Self-pay | Admitting: Nurse Practitioner

## 2018-03-31 DIAGNOSIS — J3089 Other allergic rhinitis: Secondary | ICD-10-CM | POA: Diagnosis not present

## 2018-03-31 DIAGNOSIS — J3081 Allergic rhinitis due to animal (cat) (dog) hair and dander: Secondary | ICD-10-CM | POA: Diagnosis not present

## 2018-04-07 DIAGNOSIS — J3089 Other allergic rhinitis: Secondary | ICD-10-CM | POA: Diagnosis not present

## 2018-04-07 DIAGNOSIS — J3081 Allergic rhinitis due to animal (cat) (dog) hair and dander: Secondary | ICD-10-CM | POA: Diagnosis not present

## 2018-04-13 DIAGNOSIS — M5412 Radiculopathy, cervical region: Secondary | ICD-10-CM | POA: Diagnosis not present

## 2018-04-14 DIAGNOSIS — J3089 Other allergic rhinitis: Secondary | ICD-10-CM | POA: Diagnosis not present

## 2018-04-14 DIAGNOSIS — J3081 Allergic rhinitis due to animal (cat) (dog) hair and dander: Secondary | ICD-10-CM | POA: Diagnosis not present

## 2018-04-16 DIAGNOSIS — M25511 Pain in right shoulder: Secondary | ICD-10-CM | POA: Diagnosis not present

## 2018-04-18 ENCOUNTER — Other Ambulatory Visit: Payer: Self-pay | Admitting: Nurse Practitioner

## 2018-04-20 ENCOUNTER — Other Ambulatory Visit: Payer: Self-pay | Admitting: Nurse Practitioner

## 2018-04-21 DIAGNOSIS — J3081 Allergic rhinitis due to animal (cat) (dog) hair and dander: Secondary | ICD-10-CM | POA: Diagnosis not present

## 2018-04-21 DIAGNOSIS — J3089 Other allergic rhinitis: Secondary | ICD-10-CM | POA: Diagnosis not present

## 2018-04-28 DIAGNOSIS — J3081 Allergic rhinitis due to animal (cat) (dog) hair and dander: Secondary | ICD-10-CM | POA: Diagnosis not present

## 2018-04-28 DIAGNOSIS — J3089 Other allergic rhinitis: Secondary | ICD-10-CM | POA: Diagnosis not present

## 2018-05-05 DIAGNOSIS — J3089 Other allergic rhinitis: Secondary | ICD-10-CM | POA: Diagnosis not present

## 2018-05-05 DIAGNOSIS — J3081 Allergic rhinitis due to animal (cat) (dog) hair and dander: Secondary | ICD-10-CM | POA: Diagnosis not present

## 2018-05-06 ENCOUNTER — Ambulatory Visit (INDEPENDENT_AMBULATORY_CARE_PROVIDER_SITE_OTHER): Payer: Medicare Other

## 2018-05-06 ENCOUNTER — Ambulatory Visit (INDEPENDENT_AMBULATORY_CARE_PROVIDER_SITE_OTHER): Payer: Medicare Other | Admitting: Internal Medicine

## 2018-05-06 ENCOUNTER — Encounter: Payer: Self-pay | Admitting: Internal Medicine

## 2018-05-06 ENCOUNTER — Other Ambulatory Visit: Payer: Self-pay | Admitting: Internal Medicine

## 2018-05-06 VITALS — BP 112/80 | HR 59 | Temp 97.9°F | Ht 62.0 in | Wt 156.4 lb

## 2018-05-06 DIAGNOSIS — L409 Psoriasis, unspecified: Secondary | ICD-10-CM | POA: Diagnosis not present

## 2018-05-06 DIAGNOSIS — G8929 Other chronic pain: Secondary | ICD-10-CM

## 2018-05-06 DIAGNOSIS — Z1389 Encounter for screening for other disorder: Secondary | ICD-10-CM

## 2018-05-06 DIAGNOSIS — Z23 Encounter for immunization: Secondary | ICD-10-CM | POA: Diagnosis not present

## 2018-05-06 DIAGNOSIS — M545 Low back pain, unspecified: Secondary | ICD-10-CM

## 2018-05-06 DIAGNOSIS — E559 Vitamin D deficiency, unspecified: Secondary | ICD-10-CM | POA: Diagnosis not present

## 2018-05-06 DIAGNOSIS — Z800 Family history of malignant neoplasm of digestive organs: Secondary | ICD-10-CM | POA: Diagnosis not present

## 2018-05-06 DIAGNOSIS — N83202 Unspecified ovarian cyst, left side: Secondary | ICD-10-CM

## 2018-05-06 DIAGNOSIS — Z1231 Encounter for screening mammogram for malignant neoplasm of breast: Secondary | ICD-10-CM | POA: Diagnosis not present

## 2018-05-06 DIAGNOSIS — N926 Irregular menstruation, unspecified: Secondary | ICD-10-CM | POA: Diagnosis not present

## 2018-05-06 DIAGNOSIS — M549 Dorsalgia, unspecified: Secondary | ICD-10-CM | POA: Diagnosis not present

## 2018-05-06 DIAGNOSIS — M542 Cervicalgia: Secondary | ICD-10-CM

## 2018-05-06 DIAGNOSIS — F329 Major depressive disorder, single episode, unspecified: Secondary | ICD-10-CM | POA: Diagnosis not present

## 2018-05-06 DIAGNOSIS — R319 Hematuria, unspecified: Secondary | ICD-10-CM

## 2018-05-06 DIAGNOSIS — E039 Hypothyroidism, unspecified: Secondary | ICD-10-CM

## 2018-05-06 DIAGNOSIS — M25511 Pain in right shoulder: Secondary | ICD-10-CM | POA: Insufficient documentation

## 2018-05-06 DIAGNOSIS — E785 Hyperlipidemia, unspecified: Secondary | ICD-10-CM

## 2018-05-06 DIAGNOSIS — F32A Depression, unspecified: Secondary | ICD-10-CM

## 2018-05-06 DIAGNOSIS — Z803 Family history of malignant neoplasm of breast: Secondary | ICD-10-CM | POA: Diagnosis not present

## 2018-05-06 DIAGNOSIS — M5412 Radiculopathy, cervical region: Secondary | ICD-10-CM

## 2018-05-06 DIAGNOSIS — E119 Type 2 diabetes mellitus without complications: Secondary | ICD-10-CM | POA: Diagnosis not present

## 2018-05-06 HISTORY — DX: Pain in right shoulder: M25.511

## 2018-05-06 LAB — LUTEINIZING HORMONE: LH: 28.78 m[IU]/mL

## 2018-05-06 LAB — CBC WITH DIFFERENTIAL/PLATELET
BASOS ABS: 0.1 10*3/uL (ref 0.0–0.1)
Basophils Relative: 0.7 % (ref 0.0–3.0)
Eosinophils Absolute: 0.2 10*3/uL (ref 0.0–0.7)
Eosinophils Relative: 2 % (ref 0.0–5.0)
HCT: 42.4 % (ref 36.0–46.0)
Hemoglobin: 14.5 g/dL (ref 12.0–15.0)
LYMPHS ABS: 2.8 10*3/uL (ref 0.7–4.0)
Lymphocytes Relative: 30.1 % (ref 12.0–46.0)
MCHC: 34.2 g/dL (ref 30.0–36.0)
MCV: 92.5 fl (ref 78.0–100.0)
Monocytes Absolute: 0.6 10*3/uL (ref 0.1–1.0)
Monocytes Relative: 6.6 % (ref 3.0–12.0)
NEUTROS PCT: 60.6 % (ref 43.0–77.0)
Neutro Abs: 5.7 10*3/uL (ref 1.4–7.7)
Platelets: 413 10*3/uL — ABNORMAL HIGH (ref 150.0–400.0)
RBC: 4.59 Mil/uL (ref 3.87–5.11)
RDW: 12.6 % (ref 11.5–15.5)
WBC: 9.4 10*3/uL (ref 4.0–10.5)

## 2018-05-06 LAB — VITAMIN D 25 HYDROXY (VIT D DEFICIENCY, FRACTURES): VITD: 33.42 ng/mL (ref 30.00–100.00)

## 2018-05-06 LAB — FOLLICLE STIMULATING HORMONE: FSH: 46.8 m[IU]/mL

## 2018-05-06 MED ORDER — CLOBETASOL PROPIONATE 0.05 % EX CREA: 1.0000 "application " | TOPICAL_CREAM | Freq: Two times a day (BID) | CUTANEOUS | 2 refills | Status: DC

## 2018-05-06 MED ORDER — DESONIDE 0.05 % EX CREA: TOPICAL_CREAM | Freq: Two times a day (BID) | CUTANEOUS | 0 refills | Status: DC

## 2018-05-06 MED ORDER — CLOBETASOL PROPIONATE 0.05 % EX SOLN: 1.0000 "application " | Freq: Two times a day (BID) | CUTANEOUS | 2 refills | Status: DC

## 2018-05-06 NOTE — Progress Notes (Signed)
Pre visit review using our clinic review tool, if applicable. No additional management support is needed unless otherwise documented below in the visit note. 

## 2018-05-06 NOTE — Progress Notes (Addendum)
Chief Complaint  Patient presents with  . Follow-up   New patient  1. Depression esp with weather changes ? SAD has been on cymbalta before declines to take "antidepressants" for now 2. HLD on lipitor 40 mg qhs  3. H/o ovarian cyst left, irregular menses since age 47 and hot flashes with FH colon and breast cancer will refer to OB/GYN 4. Right hand tremor on propranolol 20 mg bid prn 5. Psoriasis scalp, face, left upper back not currently using any medications or following with dermatology  6. Hypothyroidism on levo 125 mcg  7. H/o DM2 vs prediabetes A1C 5.9 01/14/18 controlled  8. H/o fibromyalgia prev did not currently tolerate lyrica or cymbalta and currently with right shoulder pain s/p 2 steroid shots [facility]  Chronic neck and low back pain h/o DDD  Review of Systems  Constitutional: Negative for weight loss.  HENT: Negative for hearing loss.   Eyes: Negative for blurred vision.  Respiratory: Negative for shortness of breath.   Cardiovascular: Negative for chest pain.  Gastrointestinal: Negative for abdominal pain.  Genitourinary:       +hot flashes, irreg menses   Musculoskeletal: Positive for back pain, joint pain and neck pain.  Skin: Positive for rash.  Psychiatric/Behavioral: Positive for depression.   Past Medical History:  Diagnosis Date  . Anxiety   . Benign essential tremor    right side  . Depression   . Fibromyalgia   . Herniated lumbar intervertebral disc   . Hypertension    history of blood pressure  . Ovarian cyst 08/21/2016   One septated cyst, one simple cyst both on left ovary Feb 2018  . Psoriasis   . Thyroid disease   . Type 2 diabetes mellitus (HCC) 11/29/2015  . Vitamin D deficiency 11/01/2015   Past Surgical History:  Procedure Laterality Date  . CESAREAN SECTION     2   Family History  Problem Relation Age of Onset  . Cancer Mother        lung  . Alzheimer's disease Father   . Dementia Father   . Parkinson's disease Father   . Drug  abuse Sister   . Thyroid disease Sister   . Thyroid disease Sister   . Thyroid disease Sister   . Thyroid disease Sister   . Cancer Maternal Grandmother   . Cancer Maternal Grandfather    Social History   Socioeconomic History  . Marital status: Single    Spouse name: Not on file  . Number of children: Not on file  . Years of education: Not on file  . Highest education level: Not on file  Occupational History  . Not on file  Social Needs  . Financial resource strain: Not on file  . Food insecurity:    Worry: Not on file    Inability: Not on file  . Transportation needs:    Medical: Not on file    Non-medical: Not on file  Tobacco Use  . Smoking status: Never Smoker  . Smokeless tobacco: Never Used  Substance and Sexual Activity  . Alcohol use: No    Alcohol/week: 0.0 standard drinks  . Drug use: No  . Sexual activity: Never  Lifestyle  . Physical activity:    Days per week: Not on file    Minutes per session: Not on file  . Stress: Not on file  Relationships  . Social connections:    Talks on phone: Not on file    Gets together: Not on  file    Attends religious service: Not on file    Active member of club or organization: Not on file    Attends meetings of clubs or organizations: Not on file    Relationship status: Not on file  . Intimate partner violence:    Fear of current or ex partner: Not on file    Emotionally abused: Not on file    Physically abused: Not on file    Forced sexual activity: Not on file  Other Topics Concern  . Not on file  Social History Narrative  . Not on file   Current Meds  Medication Sig  . atorvastatin (LIPITOR) 40 MG tablet TAKE 1 TABLET BY MOUTH AT BEDTIME  . levothyroxine (SYNTHROID, LEVOTHROID) 125 MCG tablet TAKE 1 TABLET BY MOUTH DAILY BEFORE BREAKFAST  . metFORMIN (GLUCOPHAGE-XR) 500 MG 24 hr tablet TAKE 2 TABLETS BY MOUTH TWICE DAILY  . metFORMIN (GLUCOPHAGE-XR) 500 MG 24 hr tablet TAKE 2 TABLETS BY MOUTH TWICE DAILY   . propranolol (INDERAL) 20 MG tablet Take 1 tablet (20 mg total) by mouth 2 (two) times daily as needed.  . traZODone (DESYREL) 100 MG tablet TAKE 1/2 TO 1 TABLET BY MOUTH AT BEDTIME AS NEEDED FOR SLEEP   Allergies  Allergen Reactions  . Lyrica [Pregabalin] Other (See Comments)    Patient reports of facial tingling Patient reports of facial tingling   No results found for this or any previous visit (from the past 2160 hour(s)). Objective  Body mass index is 28.61 kg/m. Wt Readings from Last 3 Encounters:  05/06/18 156 lb 6.4 oz (70.9 kg)  02/04/18 148 lb 1.6 oz (67.2 kg)  01/14/18 147 lb (66.7 kg)   Temp Readings from Last 3 Encounters:  05/06/18 97.9 F (36.6 C) (Oral)  02/04/18 98.2 F (36.8 C)  01/14/18 97.9 F (36.6 C) (Oral)   BP Readings from Last 3 Encounters:  05/06/18 112/80  02/04/18 122/64  01/14/18 126/74   Pulse Readings from Last 3 Encounters:  05/06/18 (!) 59  02/04/18 85  01/14/18 75    Physical Exam  Constitutional: She is oriented to person, place, and time. Vital signs are normal. She appears well-developed and well-nourished. She is cooperative.  HENT:  Head: Normocephalic and atraumatic.  Mouth/Throat: Oropharynx is clear and moist and mucous membranes are normal.  Eyes: Pupils are equal, round, and reactive to light. Conjunctivae are normal.  Cardiovascular: Normal rate, regular rhythm and normal heart sounds.  Pulmonary/Chest: Effort normal and breath sounds normal.  Neurological: She is alert and oriented to person, place, and time. Gait normal.  Skin: Skin is warm, dry and intact.  Psychiatric: She has a normal mood and affect. Her speech is normal and behavior is normal. Judgment and thought content normal. Cognition and memory are normal.  Nursing note and vitals reviewed.   Assessment   1. Depression esp with weather changes ? SAD 2. HLD  3. H/o ovarian cyst left, irregular menses since age 80 4. Right hand tremor  5. Psoriasis  scalp, face, left upper back  6. Hypothyroidism on levo 7. H/o DM2 vs prediabetes A1C 5.9 01/14/18 8. HM 9. Right shoulder pain s/p 2 steroid shots [facility]  Chronic neck and low back pain h/o DDD per pt Xray lo wback 05/06/18 negative  Plan  1.  Monitor declines to try medication for now consider Wellbutrin  2.  Cont lipitor 40 mg qhs  3.  Refer to OB/GYN FH colon and breast cancer consider  genetic testing in the future with Ob/GYN as well Dr. Kari Baarshelsie Ward Check FSH/LH 4.  On BB monitor propranolol 20 mg bid prn 5.  Desonide/Aclovate for face, temovate for scalp and clobetasol/ultravate for body  Consider derm if this does not help 6.  Cont levo 125 reviewed TSH 09/11/17 normal 1.21 Thyroid US 12/04/15 normal  7.  On metformin XR 500 mg 2 pills qd  A1C 5.9  8.   Declines flu shot  Tdap given today   Referred for mammogram today FH breast and colon cancer  Pap 08/15/17 neg pap neg HPV  Reviewed labs 01/14/18 CMET, lipid TGs 199, A1C 5.9, TSH 1.21 09/11/17 normal  -will do CBC, TSH, LH, vitamin D and UA today  Never smoker, no etoh  Of note reports wt gain of 10 lbs was 146 lbs   Eye [facility]  Former PCP Dr. Sherie DonLada   9. F/u [facility]  Consider MRI with [facility] right shoulder if not better  Consider Xrays neck and low back today negative   H/o right shoulder Xray in the past negative except DDD C5/6 c/w cervical radiculopathy and right shoulder impingement Dr. Twanna [facility]Kras[facility]  MRI C spine 04/13/18: multilevel cervical spondylosis greatest C5,6 mild spinal canal and severe right neural foraminal narrowing    Provider: Dr. French Anaracy [facility]

## 2018-05-06 NOTE — Patient Instructions (Addendum)
Dr. Tonette Biharihesie [facility] ob/gyn    Back Pain, Adult Many adults have back pain from time to time. Common causes of back pain include:  A strained muscle or ligament.  Wear and tear (degeneration) of the spinal disks.  Arthritis.  A hit to the back.  Back pain can be short-lived (acute) or last a long time (chronic). A physical exam, lab tests, and imaging studies may be done to find the cause of your pain. Follow these instructions at home: Managing pain and stiffness  Take over-the-counter and prescription medicines only as told by your health care provider.  If directed, apply heat to the affected area as often as told by your health care provider. Use the heat source that your health care provider recommends, such as a moist heat pack or a heating pad. ? Place a towel between your skin and the heat source. ? Leave the heat on for 20-30 minutes. ? Remove the heat if your skin turns bright red. This is especially important if you are unable to feel pain, heat, or cold. You have a greater risk of getting burned.  If directed, apply ice to the injured area: ? Put ice in a plastic bag. ? Place a towel between your skin and the bag. ? Leave the ice on for 20 minutes, 2-3 times a day for the first 2-3 days. Activity  Do not stay in bed. Resting more than 1-2 days can delay your recovery.  Take short walks on even surfaces as soon as you are able. Try to increase the length of time you walk each day.  Do not sit, drive, or stand in one place for more than 30 minutes at a time. Sitting or standing for long periods of time can put stress on your back.  Use proper lifting techniques. When you bend and lift, use positions that put less stress on your back: ? [place] your [place]. ? Keep the load close to your body. ? Avoid twisting.  Exercise regularly as told by your health care provider. Exercising will help your back heal faster. This also helps prevent back injuries by keeping muscles strong  and flexible.  Your health care provider may recommend that you see a physical therapist. This person can help you come up with a safe exercise program. Do any exercises as told by your physical therapist. Lifestyle  Maintain a healthy weight. Extra weight puts stress on your back and makes it difficult to have good posture.  Avoid activities or situations that make you feel anxious or stressed. Learn ways to manage anxiety and stress. One way to manage stress is through exercise. Stress and anxiety increase muscle tension and can make back pain worse. General instructions  Sleep on a firm mattress in a comfortable position. Try lying on your side with your knees slightly bent. If you lie on your back, put a pillow under your knees.  Follow your treatment plan as told by your health care provider. This may include: ? Cognitive or behavioral therapy. ? Acupuncture or massage therapy. ? Meditation or yoga. Contact a health care provider if:  You have pain that is not relieved with rest or medicine.  You have increasing pain going down into your legs or buttocks.  Your pain does not improve in 2 weeks.  You have pain at night.  You lose weight.  You have a fever or chills. Get help right away if:  You develop new bowel or bladder control problems.  You have  unusual weakness or numbness in your arms or legs.  You develop nausea or vomiting.  You develop abdominal pain.  You feel faint. Summary  Many adults have back pain from time to time. A physical exam, lab tests, and imaging studies may be done to find the cause of your pain.  Use proper lifting techniques. When you bend and lift, use positions that put less stress on your back.  Take over-the-counter and prescription medicines and apply heat or ice as directed by your health care provider. This information is not intended to replace advice given to you by your health care provider. Make sure you discuss any questions  you have with your health care provider. Document Released: 06/10/2005 Document Revised: 07/15/2016 Document Reviewed: 07/15/2016 Elsevier Interactive Patient Education  [employer].

## 2018-05-07 ENCOUNTER — Encounter: Payer: Self-pay | Admitting: Internal Medicine

## 2018-05-07 LAB — URINALYSIS, ROUTINE W REFLEX MICROSCOPIC
BILIRUBIN URINE: NEGATIVE
Bacteria, UA: NONE SEEN /HPF
GLUCOSE, UA: NEGATIVE
Hyaline Cast: NONE SEEN /LPF
Ketones, ur: NEGATIVE
Leukocytes, UA: NEGATIVE
NITRITE: NEGATIVE
PH: 6 (ref 5.0–8.0)
PROTEIN: NEGATIVE
Specific Gravity, Urine: 1.014 (ref 1.001–1.03)
WBC UA: NONE SEEN /HPF (ref 0–5)

## 2018-05-11 ENCOUNTER — Encounter: Payer: Self-pay | Admitting: Internal Medicine

## 2018-05-11 DIAGNOSIS — R251 Tremor, unspecified: Secondary | ICD-10-CM | POA: Insufficient documentation

## 2018-05-11 DIAGNOSIS — Z803 Family history of malignant neoplasm of breast: Secondary | ICD-10-CM | POA: Insufficient documentation

## 2018-05-11 HISTORY — DX: Tremor, unspecified: R25.1

## 2018-05-11 MED ORDER — HALOBETASOL PROPIONATE 0.05 % EX CREA: TOPICAL_CREAM | Freq: Two times a day (BID) | CUTANEOUS | 2 refills | Status: DC

## 2018-05-11 MED ORDER — ALCLOMETASONE DIPROPIONATE 0.05 % EX CREA: TOPICAL_CREAM | Freq: Two times a day (BID) | CUTANEOUS | 2 refills | Status: DC

## 2018-05-12 DIAGNOSIS — J3081 Allergic rhinitis due to animal (cat) (dog) hair and dander: Secondary | ICD-10-CM | POA: Diagnosis not present

## 2018-05-12 DIAGNOSIS — J3089 Other allergic rhinitis: Secondary | ICD-10-CM | POA: Diagnosis not present

## 2018-05-15 DIAGNOSIS — M5412 Radiculopathy, cervical region: Secondary | ICD-10-CM | POA: Insufficient documentation

## 2018-05-19 DIAGNOSIS — J3081 Allergic rhinitis due to animal (cat) (dog) hair and dander: Secondary | ICD-10-CM | POA: Diagnosis not present

## 2018-05-19 DIAGNOSIS — J3089 Other allergic rhinitis: Secondary | ICD-10-CM | POA: Diagnosis not present

## 2018-05-26 DIAGNOSIS — J3089 Other allergic rhinitis: Secondary | ICD-10-CM | POA: Diagnosis not present

## 2018-05-26 DIAGNOSIS — J3081 Allergic rhinitis due to animal (cat) (dog) hair and dander: Secondary | ICD-10-CM | POA: Diagnosis not present

## 2018-06-02 DIAGNOSIS — J3081 Allergic rhinitis due to animal (cat) (dog) hair and dander: Secondary | ICD-10-CM | POA: Diagnosis not present

## 2018-06-02 DIAGNOSIS — J3089 Other allergic rhinitis: Secondary | ICD-10-CM | POA: Diagnosis not present

## 2018-06-09 DIAGNOSIS — J3081 Allergic rhinitis due to animal (cat) (dog) hair and dander: Secondary | ICD-10-CM | POA: Diagnosis not present

## 2018-06-09 DIAGNOSIS — J3089 Other allergic rhinitis: Secondary | ICD-10-CM | POA: Diagnosis not present

## 2018-06-17 ENCOUNTER — Other Ambulatory Visit: Payer: Self-pay | Admitting: Nurse Practitioner

## 2018-06-25 DIAGNOSIS — M25511 Pain in right shoulder: Secondary | ICD-10-CM | POA: Diagnosis not present

## 2018-06-29 ENCOUNTER — Encounter: Payer: Self-pay | Admitting: Internal Medicine

## 2018-06-30 DIAGNOSIS — J3081 Allergic rhinitis due to animal (cat) (dog) hair and dander: Secondary | ICD-10-CM | POA: Diagnosis not present

## 2018-06-30 DIAGNOSIS — J3089 Other allergic rhinitis: Secondary | ICD-10-CM | POA: Diagnosis not present

## 2018-07-07 DIAGNOSIS — J3081 Allergic rhinitis due to animal (cat) (dog) hair and dander: Secondary | ICD-10-CM | POA: Diagnosis not present

## 2018-07-07 DIAGNOSIS — J3089 Other allergic rhinitis: Secondary | ICD-10-CM | POA: Diagnosis not present

## 2018-07-08 ENCOUNTER — Ambulatory Visit (INDEPENDENT_AMBULATORY_CARE_PROVIDER_SITE_OTHER): Payer: Medicare Other | Admitting: Internal Medicine

## 2018-07-08 ENCOUNTER — Encounter: Payer: Self-pay | Admitting: Internal Medicine

## 2018-07-08 VITALS — BP 132/90 | HR 57 | Temp 98.2°F | Ht 62.0 in | Wt 158.4 lb

## 2018-07-08 DIAGNOSIS — M1990 Unspecified osteoarthritis, unspecified site: Secondary | ICD-10-CM

## 2018-07-08 DIAGNOSIS — M797 Fibromyalgia: Secondary | ICD-10-CM

## 2018-07-08 DIAGNOSIS — W19XXXA Unspecified fall, initial encounter: Secondary | ICD-10-CM | POA: Insufficient documentation

## 2018-07-08 DIAGNOSIS — G4700 Insomnia, unspecified: Secondary | ICD-10-CM

## 2018-07-08 DIAGNOSIS — G8929 Other chronic pain: Secondary | ICD-10-CM | POA: Insufficient documentation

## 2018-07-08 DIAGNOSIS — E785 Hyperlipidemia, unspecified: Secondary | ICD-10-CM

## 2018-07-08 DIAGNOSIS — G5603 Carpal tunnel syndrome, bilateral upper limbs: Secondary | ICD-10-CM

## 2018-07-08 DIAGNOSIS — G250 Essential tremor: Secondary | ICD-10-CM

## 2018-07-08 DIAGNOSIS — W19XXXD Unspecified fall, subsequent encounter: Secondary | ICD-10-CM

## 2018-07-08 DIAGNOSIS — R319 Hematuria, unspecified: Secondary | ICD-10-CM

## 2018-07-08 DIAGNOSIS — E119 Type 2 diabetes mellitus without complications: Secondary | ICD-10-CM

## 2018-07-08 DIAGNOSIS — Z1159 Encounter for screening for other viral diseases: Secondary | ICD-10-CM

## 2018-07-08 DIAGNOSIS — L409 Psoriasis, unspecified: Secondary | ICD-10-CM | POA: Diagnosis not present

## 2018-07-08 DIAGNOSIS — Z0184 Encounter for antibody response examination: Secondary | ICD-10-CM

## 2018-07-08 DIAGNOSIS — E039 Hypothyroidism, unspecified: Secondary | ICD-10-CM

## 2018-07-08 HISTORY — DX: Other chronic pain: G89.29

## 2018-07-08 MED ORDER — METFORMIN HCL ER 500 MG PO TB24: 1000.0000 mg | ORAL_TABLET | Freq: Two times a day (BID) | ORAL | 3 refills | Status: DC

## 2018-07-08 MED ORDER — LEVOTHYROXINE SODIUM 125 MCG PO TABS: ORAL_TABLET | ORAL | 3 refills | Status: DC

## 2018-07-08 MED ORDER — DICLOFENAC SODIUM 1 % TD GEL: 2.0000 g | Freq: Four times a day (QID) | TRANSDERMAL | 11 refills | Status: DC

## 2018-07-08 MED ORDER — ATORVASTATIN CALCIUM 40 MG PO TABS: 40.0000 mg | ORAL_TABLET | Freq: Every day | ORAL | 3 refills | Status: DC

## 2018-07-08 MED ORDER — TRAZODONE HCL 100 MG PO TABS: 50.0000 mg | ORAL_TABLET | Freq: Every evening | ORAL | 11 refills | Status: DC | PRN

## 2018-07-08 MED ORDER — PROPRANOLOL HCL 20 MG PO TABS: ORAL_TABLET | ORAL | 2 refills | Status: DC

## 2018-07-08 NOTE — Progress Notes (Signed)
Pre visit review using our clinic review tool, if applicable. No additional management support is needed unless otherwise documented below in the visit note. 

## 2018-07-08 NOTE — Patient Instructions (Addendum)
Mammogram schedule  Neurology referral and OB/GYN     Essential Tremor A tremor is trembling or shaking that a person cannot control. Most tremors affect the hands or arms. Tremors can also affect the head, vocal cords, legs, and other parts of the body. Essential tremor is a tremor without a known cause. Usually, it occurs while a person is trying to perform an action. It tends to get worse gradually as a person ages. What are the causes? The cause of this condition is not known. What increases the risk? You are more likely to develop this condition if:  You have a family member with essential tremor.  You are age 25 or older.  You take certain medicines. What are the signs or symptoms? The main sign of a tremor is a rhythmic shaking of certain parts of your body that is uncontrolled and unintentional. You may:  Have difficulty eating with a spoon or fork.  Have difficulty writing.  Nod your head up and down or side to side.  Have a quivering voice. The shaking may:  Get worse over time.  Come and go.  Be more noticeable on one side of your body.  Get worse due to stress, fatigue, caffeine, and extreme heat or cold. How is this diagnosed? This condition may be diagnosed based on:  Your symptoms and medical history.  A physical exam. There is no single test to diagnose an essential tremor. However, your health care provider may order tests to rule out other causes of your condition. These may include:  Blood and urine tests.  Imaging studies of your brain, such as CT scan and MRI.  A test that measures involuntary muscle movement (electromyogram). How is this treated? Treatment for essential tremor depends on the severity of the condition.  Some tremors may go away without treatment.  Mild tremors may not need treatment if they do not affect your day-to-day life.  Severe tremors may need to be treated using one or more of the following  options: ? Medicines. ? Lifestyle changes. ? Occupational or physical therapy. Follow these instructions at home: Lifestyle   Do not use any products that contain nicotine or tobacco, such as cigarettes and e-cigarettes. If you need help quitting, ask your health care provider.  Limit your caffeine intake as told by your health care provider.  Try to get 8 hours of sleep each night.  Find ways to manage your stress that fits your lifestyle and personality. Consider trying meditation or yoga.  Try to anticipate stressful situations and allow extra time to manage them.  If you are struggling emotionally with the effects of your tremor, consider working with a mental health provider. General instructions  Take over-the-counter and prescription medicines only as told by your health care provider.  Avoid extreme heat and extreme cold.  Keep all follow-up visits as told by your health care provider. This is important. Visits may include physical therapy visits. Contact a health care provider if:  You experience any changes in the location or intensity of your tremors.  You start having a tremor after starting a new medicine.  You have tremor with other symptoms, such as: ? Numbness. ? Tingling. ? Pain. ? Weakness.  Your tremor gets worse.  Your tremor interferes with your daily life.  You feel down, blue, or sad for at least 2 weeks in a row.  Worrying about your tremor and what other people think about you interferes with your everyday life functions, including  relationships, work, or school. Summary  Essential tremor is a tremor without a known cause. Usually, it occurs when you are trying to perform an action.  The cause of this condition is not known.  The main sign of a tremor is a rhythmic shaking of certain parts of your body that is uncontrolled and unintentional.  Treatment for essential tremor depends on the severity of the condition. This information is not  intended to replace advice given to you by your health care provider. Make sure you discuss any questions you have with your health care provider. Document Released: 07/01/2014 Document Revised: 06/20/2017 Document Reviewed: 06/20/2017 Elsevier Interactive Patient Education  2019 [employer].

## 2018-07-08 NOTE — Progress Notes (Signed)
Chief Complaint  Patient presents with  . Follow-up   F/u  1. C/o essential tremor, tremor getting worse on propranolol 20 mg bid and had 2 falls since 05/2018 where her legs give out and she falls backwards w/o LOC. Also She c/o b/l wrists pain and trouble opening medication bottles will refer to neurology for w/u and also consider EMG for CTS 2. Did not hear from OB/GYN referral and she needs to call to schedule mammo 3. Chronic pain neck abnormal MRI C, right shoulder, fibromyalgia steroid shots x 2 w/o much help long term but reduced pain short term  4. Psoriasis improved on topical medication but insurance will not cover temovate solution    Review of Systems  Constitutional: Negative for weight loss.  HENT: Negative for hearing loss.   Eyes: Negative for blurred vision.  Respiratory: Negative for shortness of breath.   Cardiovascular: Negative for chest pain.  Gastrointestinal: Negative for abdominal pain.  Musculoskeletal: Positive for back pain, falls, joint pain, myalgias and neck pain.  Skin: Positive for rash.  Neurological: Positive for tremors. Negative for loss of consciousness.  Psychiatric/Behavioral: Negative for depression.   Past Medical History:  Diagnosis Date  . Allergy   . Anxiety   . Benign essential tremor    right side  . Depression   . Fibromyalgia   . Herniated lumbar intervertebral disc   . Hyperlipidemia   . Hypertension    history of blood pressure  . Ovarian cyst 08/21/2016   One septated cyst, one simple cyst both on left ovary Feb 2018  . Psoriasis   . Thyroid disease   . Type 2 diabetes mellitus (HCC) 11/29/2015  . Vitamin D deficiency 11/01/2015   Past Surgical History:  Procedure Laterality Date  . CESAREAN SECTION     2002/2006  . TUBAL LIGATION     Family History  Problem Relation Age of Onset  . Cancer Mother        lung not smoker   . Alzheimer's disease Father   . Dementia Father   . Parkinson's disease Father   . Drug abuse  Sister   . Thyroid disease Sister   . Thyroid disease Sister   . Cancer Sister        cervical  . Asthma Sister   . Depression Sister   . Hyperlipidemia Sister   . Thyroid disease Sister   . Depression Sister   . Thyroid disease Sister   . Depression Sister   . Asthma Sister   . Arthritis Sister   . Kidney disease Sister   . Cancer Maternal Grandmother   . Asthma Maternal Grandmother   . Cancer Maternal Grandfather   . Asthma Maternal Grandfather   . Cancer Paternal Grandmother        ?type  . Cancer Paternal Grandfather        ?type  . Early death Brother        childbirth  . Cancer Other        strong FH breast cancer 6/12 maternal relatives per pt and also colon cnacer  . Cancer Other        brain   Social History   Socioeconomic History  . Marital status: Single    Spouse name: Not on file  . Number of children: Not on file  . Years of education: Not on file  . Highest education level: Not on file  Occupational History  . Not on file  Social Needs  .  Financial resource strain: Not on file  . Food insecurity:    Worry: Not on file    Inability: Not on file  . Transportation needs:    Medical: Not on file    Non-medical: Not on file  Tobacco Use  . Smoking status: Never Smoker  . Smokeless tobacco: Never Used  Substance and Sexual Activity  . Alcohol use: No    Alcohol/week: 0.0 standard drinks  . Drug use: No  . Sexual activity: Never  Lifestyle  . Physical activity:    Days per week: Not on file    Minutes per session: Not on file  . Stress: Not on file  Relationships  . Social connections:    Talks on phone: Not on file    Gets together: Not on file    Attends religious service: Not on file    Active member of club or organization: Not on file    Attends meetings of clubs or organizations: Not on file    Relationship status: Not on file  . Intimate partner violence:    Fear of current or ex partner: Not on file    Emotionally abused: Not on  file    Physically abused: Not on file    Forced sexual activity: Not on file  Other Topics Concern  . Not on file  Social History Narrative   Disabled    Some college    Owns guns   Wears seat belt, safe in relationship    Single 2 kids    Current Meds  Medication Sig  . alclomethasone (ACLOVATE) 0.05 % cream Apply topically 2 (two) times daily. Prn face as needed  . atorvastatin (LIPITOR) 40 MG tablet TAKE 1 TABLET BY MOUTH AT BEDTIME  . clobetasol (TEMOVATE) 0.05 % external solution Apply 1 application topically 2 (two) times daily. As needed to scalp  . halobetasol (ULTRAVATE) 0.05 % cream Apply topically 2 (two) times daily. Prn to body not face, underarms, groin  . levothyroxine (SYNTHROID, LEVOTHROID) 125 MCG tablet TAKE 1 TABLET BY MOUTH DAILY BEFORE BREAKFAST  . metFORMIN (GLUCOPHAGE-XR) 500 MG 24 hr tablet TAKE 2 TABLETS BY MOUTH TWICE DAILY  . propranolol (INDERAL) 20 MG tablet TAKE 1 TABLET(20 MG) BY MOUTH TWICE DAILY AS NEEDED  . traZODone (DESYREL) 100 MG tablet TAKE 1/2 TO 1 TABLET BY MOUTH AT BEDTIME AS NEEDED FOR SLEEP  . [DISCONTINUED] metFORMIN (GLUCOPHAGE-XR) 500 MG 24 hr tablet TAKE 2 TABLETS BY MOUTH TWICE DAILY   Allergies  Allergen Reactions  . Lyrica [Pregabalin] Other (See Comments)    Patient reports of facial tingling Patient reports of facial tingling  . Cymbalta [Duloxetine Hcl]     HTN, feels nothing doesn't care    Recent Results (from the past 2160 hour(s))  CBC with Differential/Platelet     Status: Abnormal   Collection Time: 05/06/18 11:46 AM  Result Value Ref Range   WBC 9.4 4.0 - 10.5 K/uL   RBC 4.59 3.87 - 5.11 Mil/uL   Hemoglobin 14.5 12.0 - 15.0 g/dL   HCT 16.142.4 09.636.0 - 04.546.0 %   MCV 92.5 78.0 - 100.0 fl   MCHC 34.2 30.0 - 36.0 g/dL   RDW 40.912.6 81.111.5 - 91.415.5 %   Platelets 413.0 (H) 150.0 - 400.0 K/uL   Neutrophils Relative % 60.6 43.0 - 77.0 %   Lymphocytes Relative 30.1 12.0 - 46.0 %   Monocytes Relative 6.6 3.0 - 12.0 %    Eosinophils Relative 2.0 0.0 - 5.0 %  Basophils Relative 0.7 0.0 - 3.0 %   Neutro Abs 5.7 1.4 - 7.7 K/uL   Lymphs Abs 2.8 0.7 - 4.0 K/uL   Monocytes Absolute 0.6 0.1 - 1.0 K/uL   Eosinophils Absolute 0.2 0.0 - 0.7 K/uL   Basophils Absolute 0.1 0.0 - 0.1 K/uL  FSH     Status: None   Collection Time: 05/06/18 11:46 AM  Result Value Ref Range   FSH 46.8 mIU/ML    Comment: Female Reference Range:  1.4-18.1 mIU/mLFemale Reference Range:Follicular Phase          2.5-10.2 mIU/mLMidCycle Peak          3.4-33.4 mIU/mLLuteal Phase          1.5-9.1 mIU/mLPost Menopausal     23.0-116.3 mIU/mLPregnant          <0.3 mIU/mL  LH     Status: None   Collection Time: 05/06/18 11:46 AM  Result Value Ref Range   LH 28.78 mIU/mL    Comment: Female Reference Range:20-70 yrs     1.5-9.3 mIU/mL>70 yrs       3.1-35.6 mIU/mLFemale Reference Range:Follicular Phase     1.9-12.5 mIU/mLMidcycle             8.7-76.3 mIU/mLLuteal Phase         0.5-16.9 mIU/mL  Post Menopausal      15.9-54.0  mIU/mLPregnant             <1.5 mIU/mLContraceptives       0.7-5.6 mIU/mL   Vitamin D (25 hydroxy)     Status: None   Collection Time: 05/06/18 11:46 AM  Result Value Ref Range   VITD 33.42 30.00 - 100.00 ng/mL  Urinalysis, Routine w reflex microscopic     Status: Abnormal   Collection Time: 05/06/18 11:46 AM  Result Value Ref Range   Color, Urine YELLOW YELLOW   APPearance CLEAR CLEAR   Specific Gravity, Urine 1.014 1.001 - 1.03   pH 6.0 5.0 - 8.0   Glucose, UA NEGATIVE NEGATIVE   Bilirubin Urine NEGATIVE NEGATIVE   Ketones, ur NEGATIVE NEGATIVE   Hgb urine dipstick 1+ (A) NEGATIVE   Protein, ur NEGATIVE NEGATIVE   Nitrite NEGATIVE NEGATIVE   Leukocytes, UA NEGATIVE NEGATIVE   WBC, UA NONE SEEN 0 - 5 /HPF   RBC / HPF 0-2 0 - 2 /HPF   Squamous Epithelial / LPF 0-5 < OR = 5 /HPF   Bacteria, UA NONE SEEN NONE SEEN /HPF   Hyaline Cast NONE SEEN NONE SEEN /LPF   Objective  Body mass index is 28.97 kg/m. Wt Readings from  Last 3 Encounters:  07/08/18 158 lb 6.4 oz (71.8 kg)  05/06/18 156 lb 6.4 oz (70.9 kg)  02/04/18 148 lb 1.6 oz (67.2 kg)   Temp Readings from Last 3 Encounters:  07/08/18 98.2 F (36.8 C) (Oral)  05/06/18 97.9 F (36.6 C) (Oral)  02/04/18 98.2 F (36.8 C)   BP Readings from Last 3 Encounters:  07/08/18 132/90  05/06/18 112/80  02/04/18 122/64   Pulse Readings from Last 3 Encounters:  07/08/18 (!) 57  05/06/18 (!) 59  02/04/18 85    Physical Exam Vitals signs and nursing note reviewed.  Constitutional:      Appearance: Normal appearance. She is well-developed and normal weight.  HENT:     Head: Normocephalic and atraumatic.     Nose: Nose normal.     Mouth/Throat:     Mouth: Mucous membranes are moist.  Pharynx: Oropharynx is clear.  Eyes:     Conjunctiva/sclera: Conjunctivae normal.     Pupils: Pupils are equal, round, and reactive to light.  Cardiovascular:     Rate and Rhythm: Normal rate and regular rhythm.     Heart sounds: Normal heart sounds.  Pulmonary:     Effort: Pulmonary effort is normal.     Breath sounds: Normal breath sounds.  Musculoskeletal:     Right shoulder: She exhibits tenderness.     Cervical back: She exhibits tenderness.  Skin:    General: Skin is warm and dry.  Neurological:     General: No focal deficit present.     Mental Status: She is alert and oriented to person, place, and time. Mental status is at baseline.     Gait: Gait normal.  Psychiatric:        Attention and Perception: Attention and perception normal.        Mood and Affect: Mood and affect normal.        Speech: Speech normal.        Behavior: Behavior normal. Behavior is cooperative.        Thought Content: Thought content normal.        Cognition and Memory: Cognition and memory normal.        Judgment: Judgment normal.     Assessment   1.essential tremors worsening, falls x 2, c/w CTS b/l wrists  2. Chronic pain neck, right shoulder and fibromyalgia  3.  Psoriasis improved with topicals  4. HM Plan  1.  Refer neurology Dr. Sherryll Burger further w/u consider EMG  2. F/u [facility]  3. Cont topicals  4.  Declines flu shot  Tdap utd consider pna 23 vx in future   Referred for mammogram today FH breast and colon cancer pt needs to sch Pap 08/15/17 neg pap neg HPV-check on [facility]  Reviewed labs 01/14/18 CMET, lipid TGs 199, A1C 5.9, TSH 1.21 09/11/17 normal  Never smoker, no etoh  Of note reports wt gain of 10 lbs was 146 lbs  sch fasting labs   [facility] [place]  Former PCP Dr. Sherie Don  Provider: Dr. French Ana McLean-Scocuz[facility]

## 2018-07-14 DIAGNOSIS — J3089 Other allergic rhinitis: Secondary | ICD-10-CM | POA: Diagnosis not present

## 2018-07-14 DIAGNOSIS — J3081 Allergic rhinitis due to animal (cat) (dog) hair and dander: Secondary | ICD-10-CM | POA: Diagnosis not present

## 2018-07-17 ENCOUNTER — Ambulatory Visit: Payer: Medicare Other | Admitting: Family Medicine

## 2018-07-21 DIAGNOSIS — J3089 Other allergic rhinitis: Secondary | ICD-10-CM | POA: Diagnosis not present

## 2018-07-21 DIAGNOSIS — J3081 Allergic rhinitis due to animal (cat) (dog) hair and dander: Secondary | ICD-10-CM | POA: Diagnosis not present

## 2018-08-04 DIAGNOSIS — R251 Tremor, unspecified: Secondary | ICD-10-CM | POA: Diagnosis not present

## 2018-08-04 DIAGNOSIS — J3089 Other allergic rhinitis: Secondary | ICD-10-CM | POA: Diagnosis not present

## 2018-08-04 DIAGNOSIS — M79642 Pain in left hand: Secondary | ICD-10-CM | POA: Diagnosis not present

## 2018-08-04 DIAGNOSIS — J3081 Allergic rhinitis due to animal (cat) (dog) hair and dander: Secondary | ICD-10-CM | POA: Diagnosis not present

## 2018-08-04 DIAGNOSIS — M79641 Pain in right hand: Secondary | ICD-10-CM | POA: Diagnosis not present

## 2018-08-11 DIAGNOSIS — M79641 Pain in right hand: Secondary | ICD-10-CM | POA: Insufficient documentation

## 2018-08-11 DIAGNOSIS — J3089 Other allergic rhinitis: Secondary | ICD-10-CM | POA: Diagnosis not present

## 2018-08-11 DIAGNOSIS — J3081 Allergic rhinitis due to animal (cat) (dog) hair and dander: Secondary | ICD-10-CM | POA: Diagnosis not present

## 2018-08-11 DIAGNOSIS — M79642 Pain in left hand: Secondary | ICD-10-CM

## 2018-08-11 DIAGNOSIS — R251 Tremor, unspecified: Secondary | ICD-10-CM | POA: Insufficient documentation

## 2018-08-11 HISTORY — DX: Pain in left hand: M79.642

## 2018-08-18 ENCOUNTER — Ambulatory Visit: Payer: Medicare Other

## 2018-08-25 DIAGNOSIS — J3081 Allergic rhinitis due to animal (cat) (dog) hair and dander: Secondary | ICD-10-CM | POA: Diagnosis not present

## 2018-08-25 DIAGNOSIS — J3089 Other allergic rhinitis: Secondary | ICD-10-CM | POA: Diagnosis not present

## 2018-09-01 DIAGNOSIS — J3081 Allergic rhinitis due to animal (cat) (dog) hair and dander: Secondary | ICD-10-CM | POA: Diagnosis not present

## 2018-09-01 DIAGNOSIS — J3089 Other allergic rhinitis: Secondary | ICD-10-CM | POA: Diagnosis not present

## 2018-09-02 ENCOUNTER — Other Ambulatory Visit: Payer: Self-pay | Admitting: Obstetrics and Gynecology

## 2018-09-02 DIAGNOSIS — Z1211 Encounter for screening for malignant neoplasm of colon: Secondary | ICD-10-CM | POA: Diagnosis not present

## 2018-09-02 DIAGNOSIS — Z01419 Encounter for gynecological examination (general) (routine) without abnormal findings: Secondary | ICD-10-CM | POA: Diagnosis not present

## 2018-09-02 DIAGNOSIS — Z1231 Encounter for screening mammogram for malignant neoplasm of breast: Secondary | ICD-10-CM

## 2018-09-02 DIAGNOSIS — Z1239 Encounter for other screening for malignant neoplasm of breast: Secondary | ICD-10-CM | POA: Diagnosis not present

## 2018-09-07 ENCOUNTER — Other Ambulatory Visit: Payer: Self-pay

## 2018-09-07 ENCOUNTER — Other Ambulatory Visit (INDEPENDENT_AMBULATORY_CARE_PROVIDER_SITE_OTHER): Payer: Medicare Other

## 2018-09-07 DIAGNOSIS — R319 Hematuria, unspecified: Secondary | ICD-10-CM | POA: Diagnosis not present

## 2018-09-07 DIAGNOSIS — E119 Type 2 diabetes mellitus without complications: Secondary | ICD-10-CM | POA: Diagnosis not present

## 2018-09-07 DIAGNOSIS — Z1159 Encounter for screening for other viral diseases: Secondary | ICD-10-CM

## 2018-09-07 DIAGNOSIS — Z0184 Encounter for antibody response examination: Secondary | ICD-10-CM

## 2018-09-07 DIAGNOSIS — E039 Hypothyroidism, unspecified: Secondary | ICD-10-CM | POA: Diagnosis not present

## 2018-09-07 LAB — TSH: TSH: 1.62 u[IU]/mL (ref 0.35–4.50)

## 2018-09-07 LAB — CBC WITH DIFFERENTIAL/PLATELET
Basophils Absolute: 0.1 10*3/uL (ref 0.0–0.1)
Basophils Relative: 1.4 % (ref 0.0–3.0)
Eosinophils Absolute: 0.3 10*3/uL (ref 0.0–0.7)
Eosinophils Relative: 3.2 % (ref 0.0–5.0)
HCT: 41.6 % (ref 36.0–46.0)
Hemoglobin: 13.9 g/dL (ref 12.0–15.0)
Lymphocytes Relative: 22.5 % (ref 12.0–46.0)
Lymphs Abs: 2.3 10*3/uL (ref 0.7–4.0)
MCHC: 33.3 g/dL (ref 30.0–36.0)
MCV: 93.9 fl (ref 78.0–100.0)
Monocytes Absolute: 0.6 10*3/uL (ref 0.1–1.0)
Monocytes Relative: 5.6 % (ref 3.0–12.0)
Neutro Abs: 7 10*3/uL (ref 1.4–7.7)
Neutrophils Relative %: 67.3 % (ref 43.0–77.0)
Platelets: 364 10*3/uL (ref 150.0–400.0)
RBC: 4.43 Mil/uL (ref 3.87–5.11)
RDW: 12.7 % (ref 11.5–15.5)
WBC: 10.4 10*3/uL (ref 4.0–10.5)

## 2018-09-07 LAB — COMPREHENSIVE METABOLIC PANEL
ALT: 22 U/L (ref 0–35)
AST: 17 U/L (ref 0–37)
Albumin: 4.3 g/dL (ref 3.5–5.2)
Alkaline Phosphatase: 64 U/L (ref 39–117)
BILIRUBIN TOTAL: 0.4 mg/dL (ref 0.2–1.2)
BUN: 13 mg/dL (ref 6–23)
CO2: 27 meq/L (ref 19–32)
Calcium: 9.5 mg/dL (ref 8.4–10.5)
Chloride: 103 mEq/L (ref 96–112)
Creatinine, Ser: 0.96 mg/dL (ref 0.40–1.20)
GFR: 62.16 mL/min (ref >60.00)
Glucose, Bld: 125 mg/dL — ABNORMAL HIGH (ref 70–99)
Potassium: 4.5 mEq/L (ref 3.5–5.1)
Sodium: 137 mEq/L (ref 135–145)
Total Protein: 7.1 g/dL (ref 6.0–8.3)

## 2018-09-07 LAB — LIPID PANEL
Cholesterol: 150 mg/dL (ref 0–200)
HDL: 46.1 mg/dL (ref >39.00)
NONHDL: 103.63
Total CHOL/HDL Ratio: 3
Triglycerides: 201 mg/dL — ABNORMAL HIGH (ref 0.0–149.0)
VLDL: 40.2 mg/dL — ABNORMAL HIGH (ref 0.0–40.0)

## 2018-09-07 LAB — LDL CHOLESTEROL, DIRECT: Direct LDL: 83 mg/dL

## 2018-09-07 LAB — HEMOGLOBIN A1C: Hgb A1c MFr Bld: 6.5 % (ref 4.6–6.5)

## 2018-09-07 NOTE — Addendum Note (Signed)
Addended by: Penne Lash on: 09/07/2018 08:48 AM   Modules accepted: Orders

## 2018-09-07 NOTE — Addendum Note (Signed)
Addended by: WIGGINS, Clare Casto N on: 09/07/2018 08:48 AM   Modules accepted: Orders  

## 2018-09-08 DIAGNOSIS — J3081 Allergic rhinitis due to animal (cat) (dog) hair and dander: Secondary | ICD-10-CM | POA: Diagnosis not present

## 2018-09-08 DIAGNOSIS — J3089 Other allergic rhinitis: Secondary | ICD-10-CM | POA: Diagnosis not present

## 2018-09-08 LAB — URINALYSIS, ROUTINE W REFLEX MICROSCOPIC
Bilirubin, UA: NEGATIVE
Glucose, UA: NEGATIVE
Ketones, UA: NEGATIVE
Leukocytes, UA: NEGATIVE
Nitrite, UA: NEGATIVE
PH UA: 5 (ref 5.0–7.5)
Protein, UA: NEGATIVE
RBC, UA: NEGATIVE
Specific Gravity, UA: 1.023 (ref 1.005–1.030)
Urobilinogen, Ur: 0.2 mg/dL (ref 0.2–1.0)

## 2018-09-08 LAB — HEPATITIS B SURFACE ANTIBODY, QUANTITATIVE: Hep B S AB Quant (Post): <5 m[IU]/mL — ABNORMAL LOW (ref >=10)

## 2018-09-08 LAB — MEASLES/MUMPS/RUBELLA IMMUNITY
MUMPS IGG: 26.4 [AU]/ml
Rubella: <0.9 index — ABNORMAL LOW
Rubeola IgG: 121 AU/mL

## 2018-09-09 LAB — URINE CULTURE

## 2018-11-06 ENCOUNTER — Ambulatory Visit: Payer: Medicare Other | Admitting: Internal Medicine

## 2018-11-06 ENCOUNTER — Other Ambulatory Visit: Payer: Self-pay

## 2018-11-06 ENCOUNTER — Ambulatory Visit (INDEPENDENT_AMBULATORY_CARE_PROVIDER_SITE_OTHER): Payer: Medicare Other | Admitting: Internal Medicine

## 2018-11-06 DIAGNOSIS — M797 Fibromyalgia: Secondary | ICD-10-CM

## 2018-11-06 DIAGNOSIS — E119 Type 2 diabetes mellitus without complications: Secondary | ICD-10-CM | POA: Diagnosis not present

## 2018-11-06 DIAGNOSIS — E039 Hypothyroidism, unspecified: Secondary | ICD-10-CM

## 2018-11-06 DIAGNOSIS — L409 Psoriasis, unspecified: Secondary | ICD-10-CM | POA: Diagnosis not present

## 2018-11-06 DIAGNOSIS — G250 Essential tremor: Secondary | ICD-10-CM | POA: Diagnosis not present

## 2018-11-06 DIAGNOSIS — J309 Allergic rhinitis, unspecified: Secondary | ICD-10-CM

## 2018-11-06 MED ORDER — MONTELUKAST SODIUM 10 MG PO TABS: 10.0000 mg | ORAL_TABLET | Freq: Every day | ORAL | 3 refills | Status: DC

## 2018-11-06 MED ORDER — CETIRIZINE HCL 10 MG PO TABS: 10.0000 mg | ORAL_TABLET | Freq: Every day | ORAL | 3 refills | Status: DC | PRN

## 2018-11-06 MED ORDER — PROPRANOLOL HCL 20 MG PO TABS: ORAL_TABLET | ORAL | 3 refills | Status: DC

## 2018-11-06 MED ORDER — GABAPENTIN 100 MG PO CAPS: 100.0000 mg | ORAL_CAPSULE | Freq: Every day | ORAL | 3 refills | Status: DC

## 2018-11-06 MED ORDER — LEVOTHYROXINE SODIUM 125 MCG PO TABS: ORAL_TABLET | ORAL | 3 refills | Status: DC

## 2018-11-06 NOTE — Progress Notes (Addendum)
Telephone Note failed Doxy no camera but sound working  I connected with Tracy Stephens   on 11/06/18 at  9:30 AM EDT by a video enabled telemedicine but ultimately telephone note and verified that I am speaking with the correct person using two identifiers.  Location patient: home Location provider:work  Persons participating in the virtual visit: patient, provider  I discussed the limitations of evaluation and management by telemedicine and the availability of in person appointments. The patient expressed understanding and agreed to proceed.   HPI: 1. DM 2 A1C 09/07/2018 6.5 taking metformin 1000 bid ER 2. Allergic rhinitis not able to get allergy shots so allergies worse never tried any otc or Rx allergy pills  3. Essential tremor neurology appt cancelled due to COVID 19  4. Saw ob/gyn 09/02/2018 Dr. Chauncey Cruel wanted to see in 1 year not able to to get mammo schedule 2/2 COVID  5. Psoriasis for now improved due to it not being cold  6. Hypothyroidism needs refill of thyroid medication  7. Currently quarentine due to 48 y.o niece being tested for cOVID 19   ROS: See pertinent positives and negatives per HPI.  Past Medical History:  Diagnosis Date  . Allergy   . Anxiety   . Benign essential tremor    right side  . Depression   . Fibromyalgia   . Herniated lumbar intervertebral disc   . Hyperlipidemia   . Hypertension    history of blood pressure  . Ovarian cyst 08/21/2016   One septated cyst, one simple cyst both on left ovary Feb 2018  . Psoriasis   . Thyroid disease   . Type 2 diabetes mellitus ([place]) 11/29/2015  . Vitamin D deficiency 11/01/2015    Past Surgical History:  Procedure Laterality Date  . CESAREAN SECTION     2002/2006  . TUBAL LIGATION      Family History  Problem Relation Age of Onset  . Cancer Mother        lung not smoker   . Alzheimer's disease Father   . Dementia Father   . Parkinson's disease Father   . Drug abuse Sister   . Thyroid disease Sister   .  Thyroid disease Sister   . Cancer Sister        cervical  . Asthma Sister   . Depression Sister   . Hyperlipidemia Sister   . Thyroid disease Sister   . Depression Sister   . Thyroid disease Sister   . Depression Sister   . Asthma Sister   . Arthritis Sister   . Kidney disease Sister   . Cancer Maternal Grandmother   . Asthma Maternal Grandmother   . Cancer Maternal Grandfather   . Asthma Maternal Grandfather   . Cancer Paternal Grandmother        ?type  . Cancer Paternal Grandfather        ?type  . Early death Brother        childbirth  . Cancer Other        strong FH breast cancer 6/12 maternal relatives per pt and also colon cnacer  . Cancer Other        brain    SOCIAL HX: lives with daughter    Current Outpatient Medications:  .  alclomethasone (ACLOVATE) 0.05 % cream, Apply topically 2 (two) times daily. Prn face as needed, Disp: 30 g, Rfl: 2 .  atorvastatin (LIPITOR) 40 MG tablet, Take 1 tablet (40 mg total) by mouth at bedtime., Disp:  90 tablet, Rfl: 3 .  diclofenac sodium (VOLTAREN) 1 % GEL, Apply 2 g topically 4 (four) times daily., Disp: 100 g, Rfl: 11 .  halobetasol (ULTRAVATE) 0.05 % cream, Apply topically 2 (two) times daily. Prn to body not face, underarms, groin, Disp: 30 g, Rfl: 2 .  levothyroxine (SYNTHROID) 125 MCG tablet, 30 minute before food., Disp: 90 tablet, Rfl: 3 .  metFORMIN (GLUCOPHAGE-XR) 500 MG 24 hr tablet, Take 2 tablets (1,000 mg total) by mouth 2 (two) times daily., Disp: 360 tablet, Rfl: 3 .  propranolol (INDERAL) 20 MG tablet, TAKE 1 TABLET(20 MG) BY MOUTH TWICE DAILY AS NEEDED, Disp: 180 tablet, Rfl: 3 .  traZODone (DESYREL) 100 MG tablet, Take 0.5-1 tablets (50-100 mg total) by mouth at bedtime as needed. for sleep, Disp: 45 tablet, Rfl: 11 .  cetirizine (ZYRTEC) 10 MG tablet, Take 1 tablet (10 mg total) by mouth daily as needed for allergies., Disp: 90 tablet, Rfl: 3 .  gabapentin (NEURONTIN) 100 MG capsule, Take 1 capsule (100 mg  total) by mouth at bedtime., Disp: 90 capsule, Rfl: 3 .  montelukast (SINGULAIR) 10 MG tablet, Take 1 tablet (10 mg total) by mouth at bedtime. For allergies, Disp: 90 tablet, Rfl: 3  EXAM:  VITALS per patient if applicable:  GENERAL: alert, oriented, appears well and in no acute distress  PSYCH/NEURO: pleasant and cooperative, no obvious depression or anxiety, speech and thought processing grossly intact  ASSESSMENT AND PLAN:  Discussed the following assessment and plan:  Fibromyalgia - Plan: gabapentin (NEURONTIN) 100 MG capsule qhs  Essential tremor - Plan: gabapentin (NEURONTIN) 100 MG capsule qhs, propranolol (INDERAL) 20 MG tablet bid  -neurology appt had to be rescheduled in future   Psoriasis-rec aclovate for face and halobetasol for body   Hypothyroidism, unspecified type - Plan: levothyroxine (SYNTHROID) 125 MCG tablet  Allergic rhinitis, unspecified seasonality, unspecified trigger - Plan: montelukast (SINGULAIR) 10 MG tablet, cetirizine (ZYRTEC) 10 MG tablet Will Rx while not able to get allergy pills   DM2 A1C 6.5  Consider ACEI or ARB in future  HM Declines flu shot  Tdap utd consider pna 23 vx in future  rec MMR vaccine and hep B vaccine  Referred for mammogram today FH breast and colon cancer pt needs to sch -unable due to covid  Pap 08/15/17 neg pap neg [facility] saw 09/02/2018 f/u in 1 year  Never smoker, no etoh  sch fasting labs 03/10/2019  Eye [facility] PCP Dr. Sanda Klein    I discussed the assessment and treatment plan with the patient. The patient was provided an opportunity to ask questions and all were answered. The patient agreed with the plan and demonstrated an understanding of the instructions.   The patient was advised to call back or seek an in-person evaluation if the symptoms worsen or if the condition fails to improve as anticipated.  Time spent 15 minutes Delorise Jackson, MD

## 2018-11-11 DIAGNOSIS — R200 Anesthesia of skin: Secondary | ICD-10-CM | POA: Diagnosis not present

## 2018-11-11 DIAGNOSIS — R29898 Other symptoms and signs involving the musculoskeletal system: Secondary | ICD-10-CM | POA: Diagnosis not present

## 2018-11-11 DIAGNOSIS — R251 Tremor, unspecified: Secondary | ICD-10-CM | POA: Diagnosis not present

## 2018-11-12 DIAGNOSIS — R29898 Other symptoms and signs involving the musculoskeletal system: Secondary | ICD-10-CM

## 2018-11-12 HISTORY — DX: Other symptoms and signs involving the musculoskeletal system: R29.898

## 2018-11-20 ENCOUNTER — Ambulatory Visit: Payer: Medicare Other | Admitting: Internal Medicine

## 2019-02-10 ENCOUNTER — Other Ambulatory Visit: Payer: Self-pay | Admitting: Internal Medicine

## 2019-02-10 DIAGNOSIS — G4700 Insomnia, unspecified: Secondary | ICD-10-CM

## 2019-02-10 MED ORDER — TRAZODONE HCL 100 MG PO TABS: 50.0000 mg | ORAL_TABLET | Freq: Every evening | ORAL | 11 refills | Status: DC | PRN

## 2019-02-12 ENCOUNTER — Other Ambulatory Visit: Payer: Self-pay | Admitting: Internal Medicine

## 2019-02-12 DIAGNOSIS — G4700 Insomnia, unspecified: Secondary | ICD-10-CM

## 2019-02-12 MED ORDER — TRAZODONE HCL 100 MG PO TABS: 50.0000 mg | ORAL_TABLET | Freq: Every evening | ORAL | 3 refills | Status: DC | PRN

## 2019-03-10 ENCOUNTER — Other Ambulatory Visit: Payer: Medicare Other

## 2019-03-15 ENCOUNTER — Other Ambulatory Visit (INDEPENDENT_AMBULATORY_CARE_PROVIDER_SITE_OTHER): Payer: Medicare Other

## 2019-03-15 ENCOUNTER — Other Ambulatory Visit: Payer: Self-pay

## 2019-03-15 DIAGNOSIS — G250 Essential tremor: Secondary | ICD-10-CM | POA: Diagnosis not present

## 2019-03-15 DIAGNOSIS — E119 Type 2 diabetes mellitus without complications: Secondary | ICD-10-CM

## 2019-03-15 DIAGNOSIS — E039 Hypothyroidism, unspecified: Secondary | ICD-10-CM | POA: Diagnosis not present

## 2019-03-15 LAB — CBC WITH DIFFERENTIAL/PLATELET
Basophils Absolute: 0.1 10*3/uL (ref 0.0–0.1)
Basophils Relative: 0.4 % (ref 0.0–3.0)
Eosinophils Absolute: 0.3 10*3/uL (ref 0.0–0.7)
Eosinophils Relative: 2.3 % (ref 0.0–5.0)
HCT: 41.1 % (ref 36.0–46.0)
Hemoglobin: 13.5 g/dL (ref 12.0–15.0)
Lymphocytes Relative: 22.6 % (ref 12.0–46.0)
Lymphs Abs: 2.6 10*3/uL (ref 0.7–4.0)
MCHC: 32.9 g/dL (ref 30.0–36.0)
MCV: 94.4 fl (ref 78.0–100.0)
Monocytes Absolute: 0.5 10*3/uL (ref 0.1–1.0)
Monocytes Relative: 4.3 % (ref 3.0–12.0)
Neutro Abs: 8.2 10*3/uL — ABNORMAL HIGH (ref 1.4–7.7)
Neutrophils Relative %: 70.4 % (ref 43.0–77.0)
Platelets: 381 10*3/uL (ref 150.0–400.0)
RBC: 4.35 Mil/uL (ref 3.87–5.11)
RDW: 12.6 % (ref 11.5–15.5)
WBC: 11.7 10*3/uL — ABNORMAL HIGH (ref 4.0–10.5)

## 2019-03-15 LAB — COMPREHENSIVE METABOLIC PANEL
ALT: 23 U/L (ref 0–35)
AST: 19 U/L (ref 0–37)
Albumin: 4.1 g/dL (ref 3.5–5.2)
Alkaline Phosphatase: 76 U/L (ref 39–117)
BUN: 11 mg/dL (ref 6–23)
CO2: 30 mEq/L (ref 19–32)
Calcium: 9.7 mg/dL (ref 8.4–10.5)
Chloride: 103 mEq/L (ref 96–112)
Creatinine, Ser: 1.03 mg/dL (ref 0.40–1.20)
GFR: 57.18 mL/min — ABNORMAL LOW (ref >60.00)
Glucose, Bld: 103 mg/dL — ABNORMAL HIGH (ref 70–99)
Potassium: 5.1 mEq/L (ref 3.5–5.1)
Sodium: 139 mEq/L (ref 135–145)
Total Bilirubin: 0.4 mg/dL (ref 0.2–1.2)
Total Protein: 6.6 g/dL (ref 6.0–8.3)

## 2019-03-15 LAB — LIPID PANEL
Cholesterol: 152 mg/dL (ref 0–200)
HDL: 45.1 mg/dL (ref >39.00)
LDL Cholesterol: 71 mg/dL (ref 0–99)
NonHDL: 106.62
Total CHOL/HDL Ratio: 3
Triglycerides: 179 mg/dL — ABNORMAL HIGH (ref 0.0–149.0)
VLDL: 35.8 mg/dL (ref 0.0–40.0)

## 2019-03-15 LAB — TSH: TSH: 14.76 u[IU]/mL — ABNORMAL HIGH (ref 0.35–4.50)

## 2019-03-15 LAB — HEMOGLOBIN A1C: Hgb A1c MFr Bld: 6.5 % (ref 4.6–6.5)

## 2019-03-16 LAB — MICROALBUMIN / CREATININE URINE RATIO
Creatinine, Urine: 56 mg/dL (ref 20–275)
Microalb, Ur: <0.2 mg/dL

## 2019-03-29 DIAGNOSIS — E119 Type 2 diabetes mellitus without complications: Secondary | ICD-10-CM | POA: Diagnosis not present

## 2019-03-29 LAB — HM DIABETES EYE EXAM

## 2019-03-31 ENCOUNTER — Encounter: Payer: Self-pay | Admitting: Internal Medicine

## 2019-03-31 ENCOUNTER — Ambulatory Visit (INDEPENDENT_AMBULATORY_CARE_PROVIDER_SITE_OTHER): Payer: Medicare Other | Admitting: Internal Medicine

## 2019-03-31 ENCOUNTER — Other Ambulatory Visit: Payer: Self-pay

## 2019-03-31 VITALS — Ht 62.0 in | Wt 160.0 lb

## 2019-03-31 DIAGNOSIS — F419 Anxiety disorder, unspecified: Secondary | ICD-10-CM | POA: Insufficient documentation

## 2019-03-31 DIAGNOSIS — Z1231 Encounter for screening mammogram for malignant neoplasm of breast: Secondary | ICD-10-CM | POA: Diagnosis not present

## 2019-03-31 DIAGNOSIS — E119 Type 2 diabetes mellitus without complications: Secondary | ICD-10-CM

## 2019-03-31 DIAGNOSIS — F329 Major depressive disorder, single episode, unspecified: Secondary | ICD-10-CM

## 2019-03-31 DIAGNOSIS — D72829 Elevated white blood cell count, unspecified: Secondary | ICD-10-CM | POA: Diagnosis not present

## 2019-03-31 DIAGNOSIS — G4700 Insomnia, unspecified: Secondary | ICD-10-CM | POA: Diagnosis not present

## 2019-03-31 DIAGNOSIS — F32A Depression, unspecified: Secondary | ICD-10-CM

## 2019-03-31 DIAGNOSIS — E039 Hypothyroidism, unspecified: Secondary | ICD-10-CM

## 2019-03-31 DIAGNOSIS — G250 Essential tremor: Secondary | ICD-10-CM

## 2019-03-31 HISTORY — DX: Depression, unspecified: F32.A

## 2019-04-05 ENCOUNTER — Encounter: Payer: Self-pay | Admitting: Internal Medicine

## 2019-04-05 ENCOUNTER — Telehealth: Payer: Self-pay | Admitting: Internal Medicine

## 2019-04-05 MED ORDER — PROPRANOLOL HCL 40 MG PO TABS: 40.0000 mg | ORAL_TABLET | Freq: Two times a day (BID) | ORAL | Status: DC

## 2019-04-05 MED ORDER — TRAZODONE HCL 50 MG PO TABS: 50.0000 mg | ORAL_TABLET | Freq: Every evening | ORAL | Status: DC | PRN

## 2019-04-05 NOTE — Telephone Encounter (Signed)
Please call pt  1. Do GAD 7 and log for visit 03/31/2019  2. Remind to schedule mammogram and given phone # [place] to call  [phone]  1st floor [place]    3. Is she ready to schedule colonoscopy?

## 2019-04-05 NOTE — Telephone Encounter (Signed)
Please call pt and do GAD 7 and add to appt 03/31/2019   Thanks [place]

## 2019-04-05 NOTE — Progress Notes (Signed)
Telephone Note  I connected with Tracy Stephens   on 03/31/19 at  9:29 AM EDT by telephone application and verified that I am speaking with the correct person using two identifiers.  Location patient: home Location provider:work or home office Persons participating in the virtual visit: patient, provider  I discussed the limitations of evaluation and management by telemedicine and the availability of in person appointments. The patient expressed understanding and agreed to proceed.   HPI: 1. DM 2 AE appt Dr. Neville Route 03/31/2019 negative retinopathy A1C 6.5 03/15/19 she wants to stay on ER Metformin though Ive informed her its been recalled will call her pharmacy to see if her batch has been recall and declined IR change for now She is not checking her blood pressure    2. Hypothyroidism TSH elevated >14 she was not taking thyroid medication but reports better compliance   3. Anxiety worse she declines medications and willing to think about therapist in the future She has tried medications in the past I.e cymbalta which she had side effects PHQ 9 score 7 03/31/19 will do GAD 7 as well   4. EMG 11/19/18 normal upper extremities b/l for tremor she is on propranolol 40 mg bid per note 07/2018 Dr. Melrose Nakayama trazadone supposed to be reduced to 50 mg qhs and on gabapentin 100 mg qhs which she has not stopped   5. Reviewed labs 03/15/19 WBC 11.7 denies cigarettes she does report some mild sinus pressure but no fever or h/a  HLD with TGS improved    ROS: See pertinent positives and negatives per HPI.  Past Medical History:  Diagnosis Date  . Allergy   . Anxiety   . Benign essential tremor    right side  . Depression   . Fibromyalgia   . Herniated lumbar intervertebral disc   . Hyperlipidemia   . Hypertension    history of blood pressure  . Ovarian cyst 08/21/2016   One septated cyst, one simple cyst both on left ovary Feb 2018  . Psoriasis   . Thyroid disease   . Type 2 diabetes mellitus ([place])  11/29/2015  . Vitamin D deficiency 11/01/2015    Past Surgical History:  Procedure Laterality Date  . CESAREAN SECTION     2002/2006  . TUBAL LIGATION      Family History  Problem Relation Age of Onset  . Cancer Mother        lung not smoker   . Alzheimer's disease Father   . Dementia Father   . Parkinson's disease Father   . Drug abuse Sister   . Thyroid disease Sister   . Thyroid disease Sister   . Cancer Sister        cervical  . Asthma Sister   . Depression Sister   . Hyperlipidemia Sister   . Thyroid disease Sister   . Depression Sister   . Thyroid disease Sister   . Depression Sister   . Asthma Sister   . Arthritis Sister   . Kidney disease Sister   . Cancer Maternal Grandmother   . Asthma Maternal Grandmother   . Cancer Maternal Grandfather   . Asthma Maternal Grandfather   . Cancer Paternal Grandmother        ?type  . Cancer Paternal Grandfather        ?type  . Early death Brother        childbirth  . Cancer Other        strong FH breast cancer 6/12 maternal  relatives per pt and also colon cnacer  . Cancer Other        brain    SOCIAL HX:  Disabled  Some college  Owns guns Wears seat belt, safe in relationship  Single 2 kids    Current Outpatient Medications:  .  alclomethasone (ACLOVATE) 0.05 % cream, Apply topically 2 (two) times daily. Prn face as needed, Disp: 30 g, Rfl: 2 .  atorvastatin (LIPITOR) 40 MG tablet, Take 1 tablet (40 mg total) by mouth at bedtime., Disp: 90 tablet, Rfl: 3 .  cetirizine (ZYRTEC) 10 MG tablet, Take 1 tablet (10 mg total) by mouth daily as needed for allergies., Disp: 90 tablet, Rfl: 3 .  diclofenac sodium (VOLTAREN) 1 % GEL, Apply 2 g topically 4 (four) times daily., Disp: 100 g, Rfl: 11 .  gabapentin (NEURONTIN) 100 MG capsule, Take 1 capsule (100 mg total) by mouth at bedtime., Disp: 90 capsule, Rfl: 3 .  halobetasol (ULTRAVATE) 0.05 % cream, Apply topically 2 (two) times daily. Prn to body not face, underarms,  groin, Disp: 30 g, Rfl: 2 .  levothyroxine (SYNTHROID) 125 MCG tablet, 30 minute before food., Disp: 90 tablet, Rfl: 3 .  metFORMIN (GLUCOPHAGE-XR) 500 MG 24 hr tablet, Take 2 tablets (1,000 mg total) by mouth 2 (two) times daily., Disp: 360 tablet, Rfl: 3 .  montelukast (SINGULAIR) 10 MG tablet, Take 1 tablet (10 mg total) by mouth at bedtime. For allergies, Disp: 90 tablet, Rfl: 3 .  traZODone (DESYREL) 50 MG tablet, Take 1 tablet (50 mg total) by mouth at bedtime as needed. for sleep, Disp: , Rfl:  .  propranolol (INDERAL) 40 MG tablet, Take 1 tablet (40 mg total) by mouth 2 (two) times daily. Per Dr. Melrose Nakayama, Disp: , Rfl:   EXAM:  VITALS per patient if applicable:  GENERAL: alert, oriented, appears well and in no acute distress  PSYCH/NEURO: pleasant and cooperative, no obvious depression or anxiety, speech and thought processing grossly intact  ASSESSMENT AND PLAN:  Discussed the following assessment and plan:  Anxiety and depression/insomnia  PHQ 9 score 7 today 03/31/2019 will do GAD 7  Pt declines meds for now will consider therapy   traZODone (DESYREL) 50 MG tablet reduced per Dr. Melrose Nakayama max to 50 mg qhs   Essential Tremor - Plan: propranolol (INDERAL) 40 MG tablet bid and gabapentin 100 mg qhs per Dr. Melrose Nakayama EMG 10/2018 normal   Hypothyroidism, unspecified type - Plan: TSH 05/03/2019 stressed compliance  Cont levo 125 mcg qd   Leukocytosis, unspecified type - Plan: CBC with Differential/Platelet No need to treat for now if continues to be elevated consider Abx for sinus sx's   Type 2 diabetes mellitus without complication N4M 6.5 7/68/08  -disc changing XR to IR pt declines will call her pharmacy to see if hers is the recalled batch but I rec change to IR she declines for now  AE eye exam 03/31/2019 normal  Pt is not checking BP but needs to monitor BP not on ARB/ACEI On statin  Urine normal 03/15/19  Will do foot exam in person f/u upcoming  HM Declines flu shot   Tdaputd consider pna 23 vx in futuregiven DM 2  rec MMR vaccine and hep B vaccine  Referred for mammogram prev FH breast and colon cancerpt needs to sch -unable due to covid encouraged again   Colonoscopy hold for 03/31/2019 per pt consider in future   Pap 08/15/17 neg pap neg [facility] saw 09/02/2018 f/u in 1  year   Never smoker, no etoh   Eye [place] 03/31/2019 AE Dr. Neville Route normal neg retinopathy   Dermatology consider in future   -we discussed possible serious and likely etiologies, options for evaluation and workup, limitations of telemedicine visit vs in person visit, treatment, treatment risks and precautions. Pt prefers to treat via telemedicine empirically rather then risking or undertaking an in person visit at this moment. Patient agrees to seek prompt in person care if worsening, new symptoms arise, or if is not improving with treatment.   I discussed the assessment and treatment plan with the patient. The patient was provided an opportunity to ask questions and all were answered. The patient agreed with the plan and demonstrated an understanding of the instructions.   The patient was advised to call back or seek an in-person evaluation if the symptoms worsen or if the condition fails to improve as anticipated.  Time spent 15 minutes  Delorise Jackson, MD

## 2019-04-06 ENCOUNTER — Telehealth: Payer: Self-pay | Admitting: Internal Medicine

## 2019-04-06 NOTE — Telephone Encounter (Signed)
Ga performed. Also was instryucted to call mammogram. Also she is  Not ready for colonoscope

## 2019-04-06 NOTE — Telephone Encounter (Signed)
Inform pt her metformin xr has NOT been recalled   Tracy Stephens

## 2019-04-06 NOTE — Telephone Encounter (Signed)
Patient has been notified

## 2019-04-29 ENCOUNTER — Other Ambulatory Visit: Payer: Self-pay

## 2019-05-03 ENCOUNTER — Other Ambulatory Visit: Payer: Self-pay | Admitting: Internal Medicine

## 2019-05-03 ENCOUNTER — Other Ambulatory Visit (INDEPENDENT_AMBULATORY_CARE_PROVIDER_SITE_OTHER): Payer: Medicare Other

## 2019-05-03 ENCOUNTER — Other Ambulatory Visit: Payer: Self-pay

## 2019-05-03 DIAGNOSIS — E039 Hypothyroidism, unspecified: Secondary | ICD-10-CM

## 2019-05-03 DIAGNOSIS — D72829 Elevated white blood cell count, unspecified: Secondary | ICD-10-CM | POA: Diagnosis not present

## 2019-05-03 LAB — CBC WITH DIFFERENTIAL/PLATELET
Basophils Absolute: 0.1 10*3/uL (ref 0.0–0.1)
Basophils Relative: 0.6 % (ref 0.0–3.0)
Eosinophils Absolute: 0.3 10*3/uL (ref 0.0–0.7)
Eosinophils Relative: 3 % (ref 0.0–5.0)
HCT: 40.3 % (ref 36.0–46.0)
Hemoglobin: 13.5 g/dL (ref 12.0–15.0)
Lymphocytes Relative: 27.7 % (ref 12.0–46.0)
Lymphs Abs: 2.7 10*3/uL (ref 0.7–4.0)
MCHC: 33.5 g/dL (ref 30.0–36.0)
MCV: 92.9 fl (ref 78.0–100.0)
Monocytes Absolute: 0.7 10*3/uL (ref 0.1–1.0)
Monocytes Relative: 7.4 % (ref 3.0–12.0)
Neutro Abs: 6.1 10*3/uL (ref 1.4–7.7)
Neutrophils Relative %: 61.3 % (ref 43.0–77.0)
Platelets: 343 10*3/uL (ref 150.0–400.0)
RBC: 4.34 Mil/uL (ref 3.87–5.11)
RDW: 12.3 % (ref 11.5–15.5)
WBC: 9.9 10*3/uL (ref 4.0–10.5)

## 2019-05-03 LAB — TSH: TSH: 0.15 u[IU]/mL — ABNORMAL LOW (ref 0.35–4.50)

## 2019-05-03 MED ORDER — LEVOTHYROXINE SODIUM 112 MCG PO TABS: ORAL_TABLET | ORAL | 3 refills | Status: DC

## 2019-08-11 ENCOUNTER — Telehealth: Payer: Self-pay | Admitting: Internal Medicine

## 2019-08-11 ENCOUNTER — Other Ambulatory Visit: Payer: Self-pay | Admitting: Internal Medicine

## 2019-08-11 DIAGNOSIS — G250 Essential tremor: Secondary | ICD-10-CM

## 2019-08-11 MED ORDER — PROPRANOLOL HCL 40 MG PO TABS: 40.0000 mg | ORAL_TABLET | Freq: Two times a day (BID) | ORAL | 3 refills | Status: DC

## 2019-08-11 NOTE — Telephone Encounter (Signed)
Pt needs a refill on propranolol (INDERAL) 40 MG tablet sent to [facility]  She is completley out

## 2019-08-11 NOTE — Telephone Encounter (Signed)
Pended for your approval or denial.  ° °

## 2019-10-05 ENCOUNTER — Other Ambulatory Visit: Payer: Self-pay | Admitting: Internal Medicine

## 2019-10-05 DIAGNOSIS — E119 Type 2 diabetes mellitus without complications: Secondary | ICD-10-CM

## 2019-10-05 DIAGNOSIS — E785 Hyperlipidemia, unspecified: Secondary | ICD-10-CM

## 2019-10-05 MED ORDER — ATORVASTATIN CALCIUM 40 MG PO TABS: 40.0000 mg | ORAL_TABLET | Freq: Every day | ORAL | 3 refills | Status: DC

## 2019-10-05 MED ORDER — METFORMIN HCL ER 500 MG PO TB24: 1000.0000 mg | ORAL_TABLET | Freq: Two times a day (BID) | ORAL | 3 refills | Status: DC

## 2019-10-08 ENCOUNTER — Telehealth: Payer: Self-pay | Admitting: Internal Medicine

## 2019-10-08 NOTE — Telephone Encounter (Signed)
Provider Response form faxed to [facility] on 10/08/19

## 2019-11-25 ENCOUNTER — Other Ambulatory Visit: Payer: Self-pay | Admitting: Internal Medicine

## 2019-11-25 DIAGNOSIS — G250 Essential tremor: Secondary | ICD-10-CM

## 2019-11-25 DIAGNOSIS — M797 Fibromyalgia: Secondary | ICD-10-CM

## 2019-11-25 MED ORDER — GABAPENTIN 100 MG PO CAPS: 100.0000 mg | ORAL_CAPSULE | Freq: Every day | ORAL | 3 refills | Status: DC

## 2019-12-06 ENCOUNTER — Telehealth: Payer: Self-pay | Admitting: Internal Medicine

## 2019-12-06 NOTE — Telephone Encounter (Signed)
Left message for patient to call back and schedule Medicare Annual Wellness Visit (AWV) either virtually or audio only.  Last AWV 08/15/17; please schedule at anytime with Denisa O'Brien-Blaney at [facility].

## 2020-01-07 ENCOUNTER — Encounter: Payer: Self-pay | Admitting: Internal Medicine

## 2020-01-07 ENCOUNTER — Other Ambulatory Visit: Payer: Self-pay

## 2020-01-07 ENCOUNTER — Ambulatory Visit (INDEPENDENT_AMBULATORY_CARE_PROVIDER_SITE_OTHER): Payer: Medicare Other | Admitting: Internal Medicine

## 2020-01-07 VITALS — BP 112/80 | HR 57 | Temp 98.1°F | Ht 62.0 in | Wt 161.4 lb

## 2020-01-07 DIAGNOSIS — R946 Abnormal results of thyroid function studies: Secondary | ICD-10-CM

## 2020-01-07 DIAGNOSIS — J309 Allergic rhinitis, unspecified: Secondary | ICD-10-CM | POA: Diagnosis not present

## 2020-01-07 DIAGNOSIS — F32A Depression, unspecified: Secondary | ICD-10-CM

## 2020-01-07 DIAGNOSIS — R590 Localized enlarged lymph nodes: Secondary | ICD-10-CM | POA: Diagnosis not present

## 2020-01-07 DIAGNOSIS — E663 Overweight: Secondary | ICD-10-CM

## 2020-01-07 DIAGNOSIS — E119 Type 2 diabetes mellitus without complications: Secondary | ICD-10-CM | POA: Diagnosis not present

## 2020-01-07 DIAGNOSIS — F419 Anxiety disorder, unspecified: Secondary | ICD-10-CM | POA: Diagnosis not present

## 2020-01-07 DIAGNOSIS — R1310 Dysphagia, unspecified: Secondary | ICD-10-CM | POA: Diagnosis not present

## 2020-01-07 DIAGNOSIS — G250 Essential tremor: Secondary | ICD-10-CM

## 2020-01-07 DIAGNOSIS — R001 Bradycardia, unspecified: Secondary | ICD-10-CM | POA: Diagnosis not present

## 2020-01-07 DIAGNOSIS — E039 Hypothyroidism, unspecified: Secondary | ICD-10-CM | POA: Diagnosis not present

## 2020-01-07 DIAGNOSIS — F329 Major depressive disorder, single episode, unspecified: Secondary | ICD-10-CM

## 2020-01-07 DIAGNOSIS — E559 Vitamin D deficiency, unspecified: Secondary | ICD-10-CM

## 2020-01-07 DIAGNOSIS — Z13818 Encounter for screening for other digestive system disorders: Secondary | ICD-10-CM | POA: Diagnosis not present

## 2020-01-07 MED ORDER — MONTELUKAST SODIUM 10 MG PO TABS: 10.0000 mg | ORAL_TABLET | Freq: Every day | ORAL | 3 refills | Status: DC

## 2020-01-07 MED ORDER — PROPRANOLOL HCL 20 MG PO TABS: 20.0000 mg | ORAL_TABLET | Freq: Two times a day (BID) | ORAL | 3 refills | Status: DC

## 2020-01-07 MED ORDER — CETIRIZINE HCL 10 MG PO TABS: 10.0000 mg | ORAL_TABLET | Freq: Every day | ORAL | 3 refills | Status: DC | PRN

## 2020-01-07 NOTE — Patient Instructions (Addendum)
COVID-19 Vaccine Information can be found at: [email or web address] For questions related to vaccine distribution or appointments, please email [email or web address] or call [phone].  Consider Pfizer   Bradycardia, Adult Bradycardia is a slower-than-normal heartbeat. A normal resting heart rate for an adult ranges from 60 to 100 beats per minute. With bradycardia, the resting heart rate is less than 60 beats per minute. Bradycardia can prevent enough oxygen from reaching certain areas of your body when you are active. It can be serious if it keeps enough oxygen from reaching your brain and other parts of your body. Bradycardia is not a problem for everyone. For some healthy adults, a slow resting heart rate is normal. What are the causes? This condition may be caused by:  A problem with the heart, including: ? A problem with the heart's electrical system, such as a heart block. With a heart block, electrical signals between the chambers of the heart are partially or completely blocked, so they are not able to work as they should. ? A problem with the heart's natural pacemaker (sinus node). ? Heart disease. ? A heart attack. ? Heart damage. ? Lyme disease. ? A heart infection. ? A heart condition that is present at birth (congenital heart defect).  Certain medicines that treat heart conditions.  Certain conditions, such as hypothyroidism and obstructive sleep apnea.  Problems with the balance of chemicals and other substances, like potassium, in the blood.  Trauma.  Radiation therapy. What increases the risk? You are more likely to develop this condition if you:  Are age 17 or older.  Have high blood pressure (hypertension), high cholesterol (hyperlipidemia), or diabetes.  Drink heavily, use tobacco or nicotine products, or use drugs. What are the signs or symptoms? Symptoms of this condition  include:  Light-headedness.  Feeling faint or fainting.  Fatigue and weakness.  Trouble with activity or exercise.  Shortness of breath.  Chest pain (angina).  Drowsiness.  Confusion.  Dizziness. How is this diagnosed? This condition may be diagnosed based on:  Your symptoms.  Your medical history.  A physical exam. During the exam, your health care provider will listen to your heartbeat and check your pulse. To confirm the diagnosis, your health care provider may order tests, such as:  Blood tests.  An electrocardiogram (ECG). This test records the heart's electrical activity. The test can show how fast your heart is beating and whether the heartbeat is steady.  A test in which you wear a portable device (event recorder or Holter monitor) to record your heart's electrical activity while you go about your day.  Anexercise test. How is this treated? Treatment for this condition depends on the cause of the condition and how severe your symptoms are. Treatment may involve:  Treatment of the underlying condition.  Changing your medicines or how much medicine you take.  Having a small, battery-operated device called a pacemaker implanted under the skin. When bradycardia occurs, this device can be used to increase your heart rate and help your heart beat in a regular rhythm. Follow these instructions at home: Lifestyle   Manage any health conditions that contribute to bradycardia as told by your health care provider.  Follow a heart-healthy diet. A nutrition specialist (dietitian) can help educate you about healthy food options and changes.  Follow an exercise program that is approved by your health care provider.  Maintain a healthy weight.  Try to reduce or manage your stress, such as with yoga or meditation. If you need help reducing stress,  ask your health care provider.  Do not use any products that contain nicotine or tobacco, such as cigarettes,  e-cigarettes, and chewing tobacco. If you need help quitting, ask your health care provider.  Do not use illegal drugs.  Limit alcohol intake to no more than 1 drink a day for nonpregnant women and 2 drinks a day for men. Be aware of how much alcohol is in your drink. In the [place] one drink equals one 12 oz bottle of beer (355 mL), one 5 oz glass of wine (148 mL), or one 1 oz glass of hard liquor (44 mL). General instructions  Take over-the-counter and prescription medicines only as told by your health care provider.  Keep all follow-up visits as told by your health care provider. This is important. How is this prevented? In some cases, bradycardia may be prevented by:  Treating underlying medical problems.  Stopping behaviors or medicines that can trigger the condition. Contact a health care provider if you:  Feel light-headed or dizzy.  Almost faint.  Feel weak or are easily fatigued during physical activity.  Experience confusion or have memory problems. Get help right away if:  You faint.  You have: ? An irregular heartbeat (palpitations). ? Chest pain. ? Trouble breathing. Summary  Bradycardia is a slower-than-normal heartbeat. With bradycardia, the resting heart rate is less than 60 beats per minute.  Treatment for this condition depends on the cause.  Manage any health conditions that contribute to bradycardia as told by your health care provider.  Do not use any products that contain nicotine or tobacco, such as cigarettes, e-cigarettes, and chewing tobacco, and limit alcohol intake.  Keep all follow-up visits as told by your health care provider. This is important. This information is not intended to replace advice given to you by your health care provider. Make sure you discuss any questions you have with your health care provider. Document Revised: 12/22/2017 Document Reviewed: 11/19/2017 Elsevier Patient Education  2020 [employer].

## 2020-01-07 NOTE — Progress Notes (Signed)
Chief Complaint  Patient presents with  . Follow-up  . Medication Refill   F/u with daughter 1. C/o trouble swallowing foot at times and agreeable to see GI but declines colonoscopy and only wants EGD if needed. At times more recently trouble swallowing her spit  2. C/o swollen glans in her neck has never seen a dentist and thinks her teeth are in good shape  3. Allergic rhinitis needs refills zyrtec 10, singulair 4. DM 2 on metformin xr 500 mg A1C 03/09/2019 6.5 controlled  5. Bradycardia today HR 57 she is on inderal 40 mg bid and cant tell this is helping tremor this was Rx by neurology and will reduced dose due to slow HR 6/ h/o anxiety/depression phq 9 score 4 and gad 7 score 6 today  7. Hypothyroidism on synthroid 112 with variable tsh  Results for Tracy, Stephens (MRN [id number]) as of 01/10/2020 08:25  09/07/2018 08:48 TSH: 1.62  03/15/2019 10:18 TSH: 14.76 (H)  05/03/2019 08:19 TSH: 0.15 (L)   Review of Systems  Constitutional: Negative for weight loss.  HENT: Negative for hearing loss.   Eyes: Negative for blurred vision.  Respiratory: Negative for shortness of breath.   Cardiovascular: Negative for chest pain.  Gastrointestinal:       +dysphagia  Musculoskeletal: Negative for falls.  Skin: Negative for rash.  Neurological: Positive for tremors. Negative for headaches.  Psychiatric/Behavioral: Negative for depression. The patient is not nervous/anxious.    Past Medical History:  Diagnosis Date  . Allergy   . Anxiety   . Benign essential tremor    right side  . Depression   . Fibromyalgia   . Herniated lumbar intervertebral disc   . Hyperlipidemia   . Hypertension    history of blood pressure  . Ovarian cyst 08/21/2016   One septated cyst, one simple cyst both on left ovary Feb 2018  . Psoriasis   . Thyroid disease   . Type 2 diabetes mellitus ([place]) 11/29/2015  . Vitamin D deficiency 11/01/2015   Past Surgical History:  Procedure Laterality Date  . CESAREAN  SECTION     2002/2006  . TUBAL LIGATION     Family History  Problem Relation Age of Onset  . Cancer Mother        lung not smoker   . Alzheimer's disease Father   . Dementia Father   . Parkinson's disease Father   . Drug abuse Sister   . Thyroid disease Sister   . Thyroid disease Sister   . Cancer Sister        cervical  . Asthma Sister   . Depression Sister   . Hyperlipidemia Sister   . Thyroid disease Sister   . Depression Sister   . Thyroid disease Sister   . Depression Sister   . Asthma Sister   . Arthritis Sister   . Kidney disease Sister   . Cancer Maternal Grandmother   . Asthma Maternal Grandmother   . Cancer Maternal Grandfather   . Asthma Maternal Grandfather   . Cancer Paternal Grandmother        ?type  . Cancer Paternal Grandfather        ?type  . Early death Brother        childbirth  . Cancer Other        strong FH breast cancer 6/12 maternal relatives per pt and also colon cnacer  . Cancer Other        brain   Social History  Socioeconomic History  . Marital status: Single    Spouse name: Not on file  . Number of children: Not on file  . Years of education: Not on file  . Highest education level: Not on file  Occupational History  . Not on file  Tobacco Use  . Smoking status: Never Smoker  . Smokeless tobacco: Never Used  Vaping Use  . Vaping Use: Never used  Substance and Sexual Activity  . Alcohol use: No    Alcohol/week: 0.0 standard drinks  . Drug use: No  . Sexual activity: Never  Other Topics Concern  . Not on file  Social History Narrative   Disabled    Some college    Owns guns   Wears seat belt, safe in relationship    Single 2 kids    Social Determinants of Health   Financial Resource Strain:   . Difficulty of Paying Living Expenses:   Food Insecurity:   . Worried About Charity fundraiser in the Last Year:   . Arboriculturist in the Last Year:   Transportation Needs:   . Film/video editor (Medical):   Marland Kitchen  Lack of Transportation (Non-Medical):   Physical Activity:   . Days of Exercise per Week:   . Minutes of Exercise per Session:   Stress:   . Feeling of Stress :   Social Connections:   . Frequency of Communication with Friends and Family:   . Frequency of Social Gatherings with Friends and Family:   . Attends Religious Services:   . Active Member of Clubs or Organizations:   . Attends Archivist Meetings:   Marland Kitchen Marital Status:   Intimate Partner Violence:   . Fear of Current or Ex-Partner:   . Emotionally Abused:   Marland Kitchen Physically Abused:   . Sexually Abused:    Current Meds  Medication Sig  . alclomethasone (ACLOVATE) 0.05 % cream Apply topically 2 (two) times daily. Prn face as needed  . atorvastatin (LIPITOR) 40 MG tablet Take 1 tablet (40 mg total) by mouth at bedtime.  . cetirizine (ZYRTEC) 10 MG tablet Take 1 tablet (10 mg total) by mouth daily as needed for allergies.  . Cholecalciferol 100 MCG (4000 UT) CAPS Take by mouth daily.  . diclofenac sodium (VOLTAREN) 1 % GEL Apply 2 g topically 4 (four) times daily.  Marland Kitchen gabapentin (NEURONTIN) 100 MG capsule Take 1 capsule (100 mg total) by mouth at bedtime.  . halobetasol (ULTRAVATE) 0.05 % cream Apply topically 2 (two) times daily. Prn to body not face, underarms, groin  . levothyroxine (SYNTHROID) 112 MCG tablet 30 minute before food.  . metFORMIN (GLUCOPHAGE-XR) 500 MG 24 hr tablet Take 2 tablets (1,000 mg total) by mouth 2 (two) times daily.  . montelukast (SINGULAIR) 10 MG tablet Take 1 tablet (10 mg total) by mouth at bedtime. For allergies  . propranolol (INDERAL) 20 MG tablet Take 1 tablet (20 mg total) by mouth 2 (two) times daily.  . traZODone (DESYREL) 50 MG tablet Take 1 tablet (50 mg total) by mouth at bedtime as needed. for sleep  . [DISCONTINUED] cetirizine (ZYRTEC) 10 MG tablet Take 1 tablet (10 mg total) by mouth daily as needed for allergies.  . [DISCONTINUED] montelukast (SINGULAIR) 10 MG tablet Take 1 tablet  (10 mg total) by mouth at bedtime. For allergies  . [DISCONTINUED] propranolol (INDERAL) 40 MG tablet Take 1 tablet (40 mg total) by mouth 2 (two) times daily. Per Dr. Melrose Nakayama  Allergies  Allergen Reactions  . Lyrica [Pregabalin] Other (See Comments)    Patient reports of facial tingling Patient reports of facial tingling  . Cymbalta [Duloxetine Hcl]     HTN, feels nothing doesn't care    No results found for this or any previous visit (from the past 2160 hour(s)). Objective  Body mass index is 29.52 kg/m. Wt Readings from Last 3 Encounters:  01/07/20 161 lb 6.4 oz (73.2 kg)  03/31/19 160 lb (72.6 kg)  07/08/18 158 lb 6.4 oz (71.8 kg)   Temp Readings from Last 3 Encounters:  01/07/20 98.1 F (36.7 C) (Oral)  07/08/18 98.2 F (36.8 C) (Oral)  05/06/18 97.9 F (36.6 C) (Oral)   BP Readings from Last 3 Encounters:  01/07/20 112/80  07/08/18 132/90  05/06/18 112/80   Pulse Readings from Last 3 Encounters:  01/07/20 (!) 57  07/08/18 (!) 57  05/06/18 (!) 59    Physical Exam Vitals and nursing note reviewed.  Constitutional:      Appearance: Normal appearance. She is well-developed, well-groomed and overweight.  HENT:     Head: Normocephalic and atraumatic.  Eyes:     Conjunctiva/sclera: Conjunctivae normal.     Pupils: Pupils are equal, round, and reactive to light.  Cardiovascular:     Rate and Rhythm: Normal rate and regular rhythm.     Heart sounds: Normal heart sounds. No murmur heard.   Pulmonary:     Effort: Pulmonary effort is normal.     Breath sounds: Normal breath sounds.  Abdominal:     General: Abdomen is flat. Bowel sounds are normal.  Skin:    General: Skin is warm and dry.  Neurological:     General: No focal deficit present.     Mental Status: She is alert and oriented to person, place, and time. Mental status is at baseline.     Gait: Gait normal.  Psychiatric:        Attention and Perception: Attention and perception normal.        Mood  and Affect: Mood and affect normal.        Speech: Speech normal.        Behavior: Behavior normal. Behavior is cooperative.        Thought Content: Thought content normal.        Cognition and Memory: Cognition and memory normal.        Judgment: Judgment normal.     Assessment  Plan  Dysphagia, unspecified type - Plan: Ambulatory referral to GI [facility] consider egd  Essential tremor - Plan: propranolol (INDERAL) 20 MG tablet bid reduced from 40 mg bid due to bradycardia  Allergic rhinitis, unspecified seasonality, unspecified trigger - Plan: montelukast (SINGULAIR) 10 MG tablet, cetirizine (ZYRTEC) 10 MG tablet  Cervical lymphadenopathy - Plan: US THYROID, CBC with Differential/Platelet  Type 2 diabetes mellitus without complication, without long-term current use of insulin (HCC) - Plan: Comprehensive metabolic panel, Lipid panel, CBC with Differential/Platelet, Hemoglobin A1c Cont meds  Bradycardia See above  Anxiety and depression Mood stable no issues  Hypothyroidism, unspecified type - Plan: US THYROID, TSH Abnormal thyroid function test - Plan: US THYROID, TSH Cont same dose levothyroxine for now  Overweight (BMI 25.0-29.9) rec healthy diet and exercise  Vitamin D deficiency - Plan: Vitamin D (25 hydroxy) rec D3 4000 IU QD  HM Declines flu shot sick and passed out x 3 times  Tdaputd consider pna 23 vx in futuregiven DM 2, declines due to flu shot  side effect rec MMR vaccine and hep B vaccine Consider covid pfizer but c/w flu shot side effect  Referred for mammogram prev FH breast and colon cancerpt needs to sch -order in encouraged to schedule   Colonoscopy hold for 01/07/20 per pt consider in future disc with GI she declines for now  Pap 08/15/17 neg pap neg [facility] saw 09/02/2018 f/u in 1 year  Never smoker, no etoh   Eye [place] 03/31/2019 AE Dr. Neville Route normal neg retinopathy   Dermatology consider in future as of 01/07/20 no need    Neurology Dr. Melrose Nakayama for tremor  Provider: Dr. Olivia Mackie McLean-Scocuzza-Internal Medicine

## 2020-01-07 NOTE — Progress Notes (Signed)
Patient flagged Current status:  PATIENT IS OVERDUE FOR BMI FOLLOW UP PLAN BMI is estimated to be 29.5 based on the last recorded weight and height Current status:  PATIENT NEEDS HgbA1c DONE THIS FISCAL YEAR Last HgbA1c was done on 03/15/2019

## 2020-01-10 ENCOUNTER — Telehealth: Payer: Self-pay | Admitting: Internal Medicine

## 2020-01-10 DIAGNOSIS — E663 Overweight: Secondary | ICD-10-CM | POA: Insufficient documentation

## 2020-01-10 DIAGNOSIS — R001 Bradycardia, unspecified: Secondary | ICD-10-CM | POA: Insufficient documentation

## 2020-01-10 NOTE — Telephone Encounter (Signed)
lft vm for pt to call to sch US 

## 2020-01-12 ENCOUNTER — Telehealth: Payer: Self-pay | Admitting: Internal Medicine

## 2020-01-12 NOTE — Telephone Encounter (Signed)
lft vm for pt to call to sch [place]

## 2020-01-14 ENCOUNTER — Other Ambulatory Visit: Payer: Self-pay

## 2020-01-14 ENCOUNTER — Other Ambulatory Visit (INDEPENDENT_AMBULATORY_CARE_PROVIDER_SITE_OTHER): Payer: Medicare Other

## 2020-01-14 DIAGNOSIS — E559 Vitamin D deficiency, unspecified: Secondary | ICD-10-CM | POA: Diagnosis not present

## 2020-01-14 DIAGNOSIS — Z13818 Encounter for screening for other digestive system disorders: Secondary | ICD-10-CM | POA: Diagnosis not present

## 2020-01-14 DIAGNOSIS — R590 Localized enlarged lymph nodes: Secondary | ICD-10-CM | POA: Diagnosis not present

## 2020-01-14 DIAGNOSIS — E119 Type 2 diabetes mellitus without complications: Secondary | ICD-10-CM

## 2020-01-14 DIAGNOSIS — R946 Abnormal results of thyroid function studies: Secondary | ICD-10-CM | POA: Diagnosis not present

## 2020-01-14 DIAGNOSIS — E039 Hypothyroidism, unspecified: Secondary | ICD-10-CM | POA: Diagnosis not present

## 2020-01-14 LAB — COMPREHENSIVE METABOLIC PANEL
ALT: 40 U/L — ABNORMAL HIGH (ref 0–35)
AST: 28 U/L (ref 0–37)
Albumin: 4.3 g/dL (ref 3.5–5.2)
Alkaline Phosphatase: 79 U/L (ref 39–117)
BUN: 10 mg/dL (ref 6–23)
CO2: 26 mEq/L (ref 19–32)
Calcium: 9.4 mg/dL (ref 8.4–10.5)
Chloride: 103 mEq/L (ref 96–112)
Creatinine, Ser: 0.99 mg/dL (ref 0.40–1.20)
GFR: 59.65 mL/min — ABNORMAL LOW (ref >60.00)
Glucose, Bld: 163 mg/dL — ABNORMAL HIGH (ref 70–99)
Potassium: 4.6 mEq/L (ref 3.5–5.1)
Sodium: 136 mEq/L (ref 135–145)
Total Bilirubin: 0.4 mg/dL (ref 0.2–1.2)
Total Protein: 6.6 g/dL (ref 6.0–8.3)

## 2020-01-14 LAB — CBC WITH DIFFERENTIAL/PLATELET
Basophils Absolute: 0.1 10*3/uL (ref 0.0–0.1)
Basophils Relative: 0.8 % (ref 0.0–3.0)
Eosinophils Absolute: 0.2 10*3/uL (ref 0.0–0.7)
Eosinophils Relative: 2.2 % (ref 0.0–5.0)
HCT: 40.9 % (ref 36.0–46.0)
Hemoglobin: 13.7 g/dL (ref 12.0–15.0)
Lymphocytes Relative: 24.6 % (ref 12.0–46.0)
Lymphs Abs: 2.4 10*3/uL (ref 0.7–4.0)
MCHC: 33.5 g/dL (ref 30.0–36.0)
MCV: 94.4 fl (ref 78.0–100.0)
Monocytes Absolute: 0.6 10*3/uL (ref 0.1–1.0)
Monocytes Relative: 6.6 % (ref 3.0–12.0)
Neutro Abs: 6.5 10*3/uL (ref 1.4–7.7)
Neutrophils Relative %: 65.8 % (ref 43.0–77.0)
Platelets: 332 10*3/uL (ref 150.0–400.0)
RBC: 4.33 Mil/uL (ref 3.87–5.11)
RDW: 12.7 % (ref 11.5–15.5)
WBC: 9.9 10*3/uL (ref 4.0–10.5)

## 2020-01-14 LAB — LIPID PANEL
Cholesterol: 135 mg/dL (ref 0–200)
HDL: 43.4 mg/dL (ref >39.00)
LDL Cholesterol: 67 mg/dL (ref 0–99)
NonHDL: 91.94
Total CHOL/HDL Ratio: 3
Triglycerides: 124 mg/dL (ref 0.0–149.0)
VLDL: 24.8 mg/dL (ref 0.0–40.0)

## 2020-01-14 LAB — VITAMIN D 25 HYDROXY (VIT D DEFICIENCY, FRACTURES): VITD: 80.83 ng/mL (ref 30.00–100.00)

## 2020-01-14 LAB — HEMOGLOBIN A1C: Hgb A1c MFr Bld: 7.7 % — ABNORMAL HIGH (ref 4.6–6.5)

## 2020-01-14 LAB — TSH: TSH: 0.87 u[IU]/mL (ref 0.35–4.50)

## 2020-01-17 ENCOUNTER — Telehealth: Payer: Self-pay | Admitting: Internal Medicine

## 2020-01-17 LAB — HEPATITIS C ANTIBODY
Hepatitis C Ab: NONREACTIVE
SIGNAL TO CUT-OFF: 0.03 (ref <1.00)

## 2020-01-17 NOTE — Telephone Encounter (Signed)
Left message to return call 

## 2020-01-17 NOTE — Telephone Encounter (Signed)
-----   Message from Bevelyn Buckles, MD sent at 01/16/2020  5:21 PM EDT ----- Vitamin D normal  Thyroid lab normal   Cholesterol improved great job  1 of liver enzymes elevated  -any alcohol  -does she want US abdomen to work this up? Gallstones or fatty liver?   Water intake rec 55 to 64 ounces daily   Blood cts normal  A1C up from 6.5 to 7.7  -is she agreeable to try [place] + Metformin XR combined in 1 pill ?  Rec healthy diet and exercise

## 2020-01-18 NOTE — Telephone Encounter (Signed)
Pt returned your call.  

## 2020-01-19 NOTE — Telephone Encounter (Signed)
Patient informed and verbalized understanding.  She does not drink alcohol. She is agreeable to imaging.  Patient is agreeable to medication.

## 2020-01-20 ENCOUNTER — Other Ambulatory Visit: Payer: Self-pay

## 2020-01-20 ENCOUNTER — Ambulatory Visit
Admission: RE | Admit: 2020-01-20 | Discharge: 2020-01-20 | Disposition: A | Discharge status: Home / Self Care | Payer: Medicare Other | Source: Ambulatory Visit | Attending: Internal Medicine | Admitting: Internal Medicine

## 2020-01-20 DIAGNOSIS — E042 Nontoxic multinodular goiter: Secondary | ICD-10-CM | POA: Diagnosis not present

## 2020-01-20 DIAGNOSIS — E039 Hypothyroidism, unspecified: Secondary | ICD-10-CM

## 2020-01-20 DIAGNOSIS — R946 Abnormal results of thyroid function studies: Secondary | ICD-10-CM

## 2020-01-20 DIAGNOSIS — R590 Localized enlarged lymph nodes: Secondary | ICD-10-CM | POA: Diagnosis not present

## 2020-01-20 DIAGNOSIS — E034 Atrophy of thyroid (acquired): Secondary | ICD-10-CM | POA: Diagnosis not present

## 2020-01-21 ENCOUNTER — Other Ambulatory Visit: Payer: Self-pay | Admitting: Internal Medicine

## 2020-01-21 DIAGNOSIS — E1165 Type 2 diabetes mellitus with hyperglycemia: Secondary | ICD-10-CM

## 2020-01-21 DIAGNOSIS — R748 Abnormal levels of other serum enzymes: Secondary | ICD-10-CM

## 2020-01-21 MED ORDER — JANUMET XR 50-1000 MG PO TB24: 1.0000 | ORAL_TABLET | Freq: Every day | ORAL | 3 refills | Status: DC

## 2020-01-24 DIAGNOSIS — Z23 Encounter for immunization: Secondary | ICD-10-CM | POA: Diagnosis not present

## 2020-01-26 ENCOUNTER — Telehealth: Payer: Self-pay | Admitting: Internal Medicine

## 2020-01-26 NOTE — Telephone Encounter (Signed)
lft vm for pt to call ofc to sch US 

## 2020-02-08 ENCOUNTER — Ambulatory Visit: Payer: Medicare Other | Attending: Internal Medicine

## 2020-02-25 DIAGNOSIS — Z23 Encounter for immunization: Secondary | ICD-10-CM | POA: Diagnosis not present

## 2020-03-01 ENCOUNTER — Other Ambulatory Visit: Payer: Self-pay

## 2020-03-01 DIAGNOSIS — G4700 Insomnia, unspecified: Secondary | ICD-10-CM

## 2020-03-01 NOTE — Telephone Encounter (Signed)
Pended for your approval or denial.  ° °

## 2020-03-05 MED ORDER — TRAZODONE HCL 50 MG PO TABS: 50.0000 mg | ORAL_TABLET | Freq: Every evening | ORAL | 3 refills | Status: DC | PRN

## 2020-03-16 ENCOUNTER — Other Ambulatory Visit: Payer: Self-pay | Admitting: Internal Medicine

## 2020-03-16 DIAGNOSIS — E039 Hypothyroidism, unspecified: Secondary | ICD-10-CM

## 2020-03-16 MED ORDER — LEVOTHYROXINE SODIUM 112 MCG PO TABS: ORAL_TABLET | ORAL | 3 refills | Status: DC

## 2020-03-28 ENCOUNTER — Ambulatory Visit: Payer: Medicare Other | Admitting: Gastroenterology

## 2020-04-10 ENCOUNTER — Ambulatory Visit: Payer: Medicare Other | Admitting: Gastroenterology

## 2020-04-11 ENCOUNTER — Telehealth: Payer: Self-pay | Admitting: Internal Medicine

## 2020-04-11 NOTE — Telephone Encounter (Signed)
Rejection Reason - Patient was No Show" [facility] said on Apr 11, 2020 2:25 PM

## 2020-04-28 ENCOUNTER — Other Ambulatory Visit: Payer: Self-pay

## 2020-04-28 DIAGNOSIS — G4700 Insomnia, unspecified: Secondary | ICD-10-CM

## 2020-04-28 MED ORDER — TRAZODONE HCL 50 MG PO TABS: 50.0000 mg | ORAL_TABLET | Freq: Every evening | ORAL | 3 refills | Status: DC | PRN

## 2020-06-05 ENCOUNTER — Other Ambulatory Visit: Payer: Self-pay | Admitting: Internal Medicine

## 2020-06-05 DIAGNOSIS — G4700 Insomnia, unspecified: Secondary | ICD-10-CM

## 2020-06-05 MED ORDER — TRAZODONE HCL 50 MG PO TABS: 50.0000 mg | ORAL_TABLET | Freq: Every evening | ORAL | 3 refills | Status: DC | PRN

## 2020-06-06 ENCOUNTER — Telehealth: Payer: Self-pay | Admitting: Internal Medicine

## 2020-06-06 NOTE — Telephone Encounter (Signed)
Faxed to [facility] for prescriber  Response form faxed on 06-06-20

## 2020-07-21 ENCOUNTER — Encounter (INDEPENDENT_AMBULATORY_CARE_PROVIDER_SITE_OTHER): Payer: Self-pay

## 2020-07-21 ENCOUNTER — Ambulatory Visit (INDEPENDENT_AMBULATORY_CARE_PROVIDER_SITE_OTHER): Payer: Medicare Other

## 2020-07-21 VITALS — Ht 62.0 in | Wt 161.0 lb

## 2020-07-21 DIAGNOSIS — Z0000 Encounter for general adult medical examination without abnormal findings: Secondary | ICD-10-CM

## 2020-07-21 NOTE — Progress Notes (Signed)
Subjective:   Azya Barbero is a 50 y.o. female who presents for Medicare Annual (Subsequent) preventive examination.  Review of Systems    No ROS.  Medicare Wellness Virtual Visit.    Cardiac Risk Factors include: advanced age (>73men, >53 women)     Objective:    Today's Vitals   07/21/20 1237  Weight: 161 lb (73 kg)  Height: 5\' 2"  (1.575 m)   Body mass index is 29.45 kg/m.  Advanced Directives 07/21/2020 11/11/2016 08/12/2016 05/09/2016 02/12/2016 11/01/2015 04/20/2015  Does Patient Have a Medical Advance Directive? No No No No No No No  Does patient want to make changes to medical advance directive? No - Patient declined - - - - - -  Would patient like information on creating a medical advance directive? - - - No - patient declined information No - patient declined information No - patient declined information No - patient declined information    Current Medications (verified) Outpatient Encounter Medications as of 07/21/2020  Medication Sig  . alclomethasone (ACLOVATE) 0.05 % cream Apply topically 2 (two) times daily. Prn face as needed  . atorvastatin (LIPITOR) 40 MG tablet Take 1 tablet (40 mg total) by mouth at bedtime.  . cetirizine (ZYRTEC) 10 MG tablet Take 1 tablet (10 mg total) by mouth daily as needed for allergies.  . Cholecalciferol 100 MCG (4000 UT) CAPS Take by mouth daily.  . diclofenac sodium (VOLTAREN) 1 % GEL Apply 2 g topically 4 (four) times daily.  07/23/2020 gabapentin (NEURONTIN) 100 MG capsule Take 1 capsule (100 mg total) by mouth at bedtime.  . halobetasol (ULTRAVATE) 0.05 % cream Apply topically 2 (two) times daily. Prn to body not face, underarms, groin  . levothyroxine (SYNTHROID) 112 MCG tablet 30 minute before food.  . montelukast (SINGULAIR) 10 MG tablet Take 1 tablet (10 mg total) by mouth at bedtime. For allergies  . propranolol (INDERAL) 20 MG tablet Take 1 tablet (20 mg total) by mouth 2 (two) times daily.  . SitaGLIPtin-MetFORMIN HCl (JANUMET XR)  50-1000 MG TB24 Take 1 tablet by mouth daily. D/c metformin xr 1000 mg bid  . traZODone (DESYREL) 50 MG tablet Take 1-2 tablets (50-100 mg total) by mouth at bedtime as needed. for sleep   No facility-administered encounter medications on file as of 07/21/2020.    Allergies (verified) Lyrica [pregabalin] and Cymbalta [duloxetine hcl]   History: Past Medical History:  Diagnosis Date  . Allergy   . Anxiety   . Benign essential tremor    right side  . Depression   . Fibromyalgia   . Herniated lumbar intervertebral disc   . Hyperlipidemia   . Hypertension    history of blood pressure  . Ovarian cyst 08/21/2016   One septated cyst, one simple cyst both on left ovary Feb 2018  . Psoriasis   . Thyroid disease   . Type 2 diabetes mellitus (HCC) 11/29/2015  . Vitamin D deficiency 11/01/2015   Past Surgical History:  Procedure Laterality Date  . CESAREAN SECTION     2002/2006  . TUBAL LIGATION     Family History  Problem Relation Age of Onset  . Cancer Mother        lung not smoker   . Alzheimer's disease Father   . Dementia Father   . Parkinson's disease Father   . Drug abuse Sister   . Thyroid disease Sister   . Thyroid disease Sister   . Cancer Sister  cervical  . Asthma Sister   . Depression Sister   . Hyperlipidemia Sister   . Thyroid disease Sister   . Depression Sister   . Thyroid disease Sister   . Depression Sister   . Asthma Sister   . Arthritis Sister   . Kidney disease Sister   . Cancer Maternal Grandmother   . Asthma Maternal Grandmother   . Cancer Maternal Grandfather   . Asthma Maternal Grandfather   . Cancer Paternal Grandmother        ?type  . Cancer Paternal Grandfather        ?type  . Early death Brother        childbirth  . Cancer Other        strong FH breast cancer 6/12 maternal relatives per pt and also colon cnacer  . Cancer Other        brain   Social History   Socioeconomic History  . Marital status: Single    Spouse name:  Not on file  . Number of children: Not on file  . Years of education: Not on file  . Highest education level: Not on file  Occupational History  . Not on file  Tobacco Use  . Smoking status: Never Smoker  . Smokeless tobacco: Never Used  Vaping Use  . Vaping Use: Never used  Substance and Sexual Activity  . Alcohol use: No    Alcohol/week: 0.0 standard drinks  . Drug use: No  . Sexual activity: Never  Other Topics Concern  . Not on file  Social History Narrative   Disabled    Some college    Owns guns   Wears seat belt, safe in relationship    Single 2 kids    Social Determinants of Health   Financial Resource Strain: Low Risk   . Difficulty of Paying Living Expenses: Not hard at all  Food Insecurity: No Food Insecurity  . Worried About Programme researcher, broadcasting/film/video in the Last Year: Never true  . Ran Out of Food in the Last Year: Never true  Transportation Needs: No Transportation Needs  . Lack of Transportation (Medical): No  . Lack of Transportation (Non-Medical): No  Physical Activity: Unknown  . Days of Exercise per Week: 0 days  . Minutes of Exercise per Session: Not on file  Stress: No Stress Concern Present  . Feeling of Stress : Not at all  Social Connections: Unknown  . Frequency of Communication with Friends and Family: More than three times a week  . Frequency of Social Gatherings with Friends and Family: Once a week  . Attends Religious Services: Not on file  . Active Member of Clubs or Organizations: Not on file  . Attends Banker Meetings: Not on file  . Marital Status: Not on file    Tobacco Counseling Counseling given: Not Answered   Clinical Intake:  Pre-visit preparation completed: Yes        Diabetes: No  How often do you need to have someone help you when you read instructions, pamphlets, or other written materials from your doctor or pharmacy?: 1 - Never  Interpreter Needed?: No      Activities of Daily Living In your  present state of health, do you have any difficulty performing the following activities: 07/21/2020  Hearing? N  Vision? N  Difficulty concentrating or making decisions? N  Walking or climbing stairs? N  Dressing or bathing? N  Doing errands, shopping? Y  Quarry manager and  eating ? N  Using the Toilet? N  In the past six months, have you accidently leaked urine? N  Do you have problems with loss of bowel control? N  Managing your Medications? N  Managing your Finances? N  Housekeeping or managing your Housekeeping? N  Some recent data might be hidden    Patient Care Team: McLean-Scocuzza, [name], MD as PCP - General (Internal Medicine)  Indicate any recent Medical Services you may have received from other than [facility] providers in the past year (date may be approximate).     Assessment:   This is a routine wellness examination for Will.  I connected with Latrina today by telephone and verified that I am speaking with the correct person using two identifiers. Location patient: home Location provider: work Persons participating in the virtual visit: patient, Engineer, civil (consulting).    I discussed the limitations, risks, security and privacy concerns of performing an evaluation and management service by telephone and the availability of in person appointments. The patient expressed understanding and verbally consented to this telephonic visit.    Interactive audio and video telecommunications were attempted between this provider and patient, however failed, due to patient having technical difficulties OR patient did not have access to video capability.  We continued and completed visit with audio only.  Some vital signs may be absent or patient reported.   Hearing/Vision screen  Hearing Screening   125Hz  250Hz  500Hz  1000Hz  2000Hz  3000Hz  4000Hz  6000Hz  8000Hz   Right ear:           Left ear:           Comments: Difficulty hearing  Vision Screening Comments: Wears corrective lenses Visual  acuity not assessed, virtual visit.       Dietary issues and exercise activities discussed: Current Exercise Habits: Home exercise routine Low carb diet Good water intake  Goals      Patient Stated   .  I would like to lose a little weight (pt-stated)      Healthy diet      Depression Screen PHQ 2/9 Scores 07/21/2020 01/07/2020 03/31/2019 11/06/2018 01/14/2018 08/15/2017 07/16/2017  PHQ - 2 Score 0 1 1 0 4 0 0  PHQ- 9 Score - 4 7 - 10 - -    Fall Risk Fall Risk  07/21/2020 01/07/2020 11/06/2018 05/06/2018 01/14/2018  Falls in the past year? 0 0 0 0 No  Number falls in past yr: 0 0 - - -  Injury with Fall? 0 0 - - -  Risk for fall due to : - - - - -  Follow up Falls evaluation completed Falls evaluation completed - - -    FALL RISK PREVENTION PERTAINING TO THE HOME: Handrails in use when climbing stairs? Yes Home free of loose throw rugs in walkways, pet beds, electrical cords, etc? Yes  Adequate lighting in your home to reduce risk of falls? Yes   ASSISTIVE DEVICES UTILIZED TO PREVENT FALLS: Life alert? No  Use of a cane, walker or w/c? No  Grab bars in the bathroom? No  Shower chair or bench in shower? No  Elevated toilet seat or a handicapped toilet? No   TIMED UP AND GO: Was the test performed? No . Virtual visit.  Cognitive Function:  Patient is alert and oriented x3.  Denies difficulty focusing, making decisions, memory loss.  Enjoys playing brain challenging games online.  MMSE/6CIT deferred. Normal by direct communication/observation.    6CIT Screen 07/21/2020  What Year? 0 points  What month? 0 points  What time? 0 points  Count back from 20 0 points  Months in reverse 0 points   Immunizations Immunization History  Administered Date(s) Administered  . Influenza,inj,Quad PF,6+ Mos 04/20/2015, 05/23/2017  . Influenza-Unspecified 04/02/2014, 04/20/2015  . Moderna Sars-Covid-2 Vaccination 01/24/2020, 02/25/2020  . Tdap 05/06/2018   Health Maintenance Health  Maintenance  Topic Date Due  . URINE MICROALBUMIN  03/14/2020  . OPHTHALMOLOGY EXAM  03/28/2020  . HEMOGLOBIN A1C  07/16/2020  . PAP SMEAR-Modifier  08/15/2020  . MAMMOGRAM  08/22/2020 (Originally 02/27/1989)  . COLONOSCOPY (Pts 45-16yrs Insurance coverage will need to be confirmed)  08/22/2020 (Originally 02/28/2016)  . PNEUMOCOCCAL POLYSACCHARIDE VACCINE AGE 42-64 HIGH RISK  01/06/2021 (Originally 02/27/1973)  . COVID-19 Vaccine (3 - Booster for Moderna series) 08/24/2020  . FOOT EXAM  01/06/2021  . TETANUS/TDAP  05/06/2028  . Hepatitis C Screening  Completed  . HIV Screening  Completed  . INFLUENZA VACCINE  Discontinued   Colonoscopy- deferred per patient preference.   Mammogram- deferred per patient preference.   Lung Cancer Screening: (Low Dose CT Chest recommended if Age 25-80 years, 30 pack-year currently smoking OR have quit w/in 15years.) does not qualify.   Hepatitis C Screening: Completed 12/25/19.  Vision Screening: Recommended annual ophthalmology exams for early detection of glaucoma and other disorders of the eye. Is the patient up to date with their annual eye exam?  Yes   Dental Screening: Recommended annual dental exams for proper oral hygiene  Community Resource Referral / Chronic Care Management: CRR required this visit?  No  CCM required this visit?  No      Plan:   Keep all routine maintenance appointments.   Follow up 08/22/20 @ 10:00. Tremors worsening.   I have personally reviewed and noted the following in the patient's chart:   . Medical and social history . Use of alcohol, tobacco or illicit drugs  . Current medications and supplements . Functional ability and status . Nutritional status . Physical activity . Advanced directives . List of other physicians . Hospitalizations, surgeries, and ER visits in previous 12 months . Vitals . Screenings to include cognitive, depression, and falls . Referrals and appointments  In addition, I have reviewed  and discussed with patient certain preventive protocols, quality metrics, and best practice recommendations. A written personalized care plan for preventive services as well as general preventive health recommendations were provided to patient via mail.     Ashok Pall, LPN   8/59/2924

## 2020-07-21 NOTE — Patient Instructions (Addendum)
Tracy Stephens , Thank you for taking time to come for your Medicare Wellness Visit. I appreciate your ongoing commitment to your health goals. Please review the following plan we discussed and let me know if I can assist you in the future.   These are the goals we discussed: Goals      Patient Stated   .  I would like to lose a little weight (pt-stated)      Healthy diet       This is a list of the screening recommended for you and due dates:  Health Maintenance  Topic Date Due  . Urine Protein Check  03/14/2020  . Eye exam for diabetics  03/28/2020  . Hemoglobin A1C  07/16/2020  . Pap Smear  08/15/2020  . Mammogram  08/22/2020*  . Colon Cancer Screening  08/22/2020*  . Pneumococcal vaccine  01/06/2021*  . COVID-19 Vaccine (3 - Booster for Moderna series) 08/24/2020  . Complete foot exam   01/06/2021  . Tetanus Vaccine  05/06/2028  .  Hepatitis C: One time screening is recommended by Center for Disease Control  (CDC) for  adults born from [birth year] through 1965.   Completed  . HIV Screening  Completed  . Flu Shot  Discontinued  *Topic was postponed. The date shown is not the original due date.    Immunizations Immunization History  Administered Date(s) Administered  . Influenza,inj,Quad PF,6+ Mos 04/20/2015, 05/23/2017  . Influenza-Unspecified 04/02/2014, 04/20/2015  . Moderna Sars-Covid-2 Vaccination 01/24/2020, 02/25/2020  . Tdap 05/06/2018   Keep all routine maintenance appointments.   Follow up 08/22/20 @ 10:00. Tremors worsening.   Advanced directives: not yet completed.   Conditions/risks identified: none new  Follow up in one year for your annual wellness visit.   Preventive Care 40-64 Years, Female Preventive care refers to lifestyle choices and visits with your health care provider that can promote health and wellness. What does preventive care include?  A yearly physical exam. This is also called an annual well check.  Dental exams once or twice a  year.  Routine eye exams. Ask your health care provider how often you should have your eyes checked.  Personal lifestyle choices, including:  Daily care of your teeth and gums.  Regular physical activity.  Eating a healthy diet.  Avoiding tobacco and drug use.  Limiting alcohol use.  Practicing safe sex.  Taking low-dose aspirin daily starting at age 10.  Taking vitamin and mineral supplements as recommended by your health care provider. What happens during an annual well check? The services and screenings done by your health care provider during your annual well check will depend on your age, overall health, lifestyle risk factors, and family history of disease. Counseling  Your health care provider may ask you questions about your:  Alcohol use.  Tobacco use.  Drug use.  Emotional well-being.  Home and relationship well-being.  Sexual activity.  Eating habits.  Work and work Statistician.  Method of birth control.  Menstrual cycle.  Pregnancy history. Screening  You may have the following tests or measurements:  Height, weight, and BMI.  Blood pressure.  Lipid and cholesterol levels. These may be checked every 5 years, or more frequently if you are over 62 years old.  Skin check.  Lung cancer screening. You may have this screening every year starting at age 36 if you have a 30-pack-year history of smoking and currently smoke or have quit within the past 15 years.  Fecal occult blood test (FOBT)  of the stool. You may have this test every year starting at age 85.  Flexible sigmoidoscopy or colonoscopy. You may have a sigmoidoscopy every 5 years or a colonoscopy every 10 years starting at age 32.  Hepatitis C blood test.  Hepatitis B blood test.  Sexually transmitted disease (STD) testing.  Diabetes screening. This is done by checking your blood sugar (glucose) after you have not eaten for a while (fasting). You may have this done every 1-3  years.  Mammogram. This may be done every 1-2 years. Talk to your health care provider about when you should start having regular mammograms. This may depend on whether you have a family history of breast cancer.  BRCA-related cancer screening. This may be done if you have a family history of breast, ovarian, tubal, or peritoneal cancers.  Pelvic exam and Pap test. This may be done every 3 years starting at age 27. Starting at age 75, this may be done every 5 years if you have a Pap test in combination with an HPV test.  Bone density scan. This is done to screen for osteoporosis. You may have this scan if you are at high risk for osteoporosis. Discuss your test results, treatment options, and if necessary, the need for more tests with your health care provider. Vaccines  Your health care provider may recommend certain vaccines, such as:  Influenza vaccine. This is recommended every year.  Tetanus, diphtheria, and acellular pertussis (Tdap, Td) vaccine. You may need a Td booster every 10 years.  Zoster vaccine. You may need this after age 14.  Pneumococcal 13-valent conjugate (PCV13) vaccine. You may need this if you have certain conditions and were not previously vaccinated.  Pneumococcal polysaccharide (PPSV23) vaccine. You may need one or two doses if you smoke cigarettes or if you have certain conditions. Talk to your health care provider about which screenings and vaccines you need and how often you need them. This information is not intended to replace advice given to you by your health care provider. Make sure you discuss any questions you have with your health care provider. Document Released: 07/07/2015 Document Revised: 02/28/2016 Document Reviewed: 04/11/2015 Elsevier Interactive Patient Education  2017 [place] Prevention in the Home Falls can cause injuries. They can happen to people of all ages. There are many things you can do to make your home safe and to  help prevent falls. What can I do on the outside of my home?  Regularly fix the edges of walkways and driveways and fix any cracks.  Remove anything that might make you trip as you walk through a door, such as a raised step or threshold.  Trim any bushes or trees on the path to your home.  Use bright outdoor lighting.  Clear any walking paths of anything that might make someone trip, such as rocks or tools.  Regularly check to see if handrails are loose or broken. Make sure that both sides of any steps have handrails.  Any raised decks and porches should have guardrails on the edges.  Have any leaves, snow, or ice cleared regularly.  Use sand or salt on walking paths during winter.  Clean up any spills in your garage right away. This includes oil or grease spills. What can I do in the bathroom?  Use night lights.  Install grab bars by the toilet and in the tub and shower. Do not use towel bars as grab bars.  Use non-skid mats or decals  in the tub or shower.  If you need to sit down in the shower, use a plastic, non-slip stool.  Keep the floor dry. Clean up any water that spills on the floor as soon as it happens.  Remove soap buildup in the tub or shower regularly.  Attach bath mats securely with double-sided non-slip rug tape.  Do not have throw rugs and other things on the floor that can make you trip. What can I do in the bedroom?  Use night lights.  Make sure that you have a light by your bed that is easy to reach.  Do not use any sheets or blankets that are too big for your bed. They should not hang down onto the floor.  Have a firm chair that has side arms. You can use this for support while you get dressed.  Do not have throw rugs and other things on the floor that can make you trip. What can I do in the kitchen?  Clean up any spills right away.  Avoid walking on wet floors.  Keep items that you use a lot in easy-to-reach places.  If you need to reach  something above you, use a strong step stool that has a grab bar.  Keep electrical cords out of the way.  Do not use floor polish or wax that makes floors slippery. If you must use wax, use non-skid floor wax.  Do not have throw rugs and other things on the floor that can make you trip. What can I do with my stairs?  Do not leave any items on the stairs.  Make sure that there are handrails on both sides of the stairs and use them. Fix handrails that are broken or loose. Make sure that handrails are as long as the stairways.  Check any carpeting to make sure that it is firmly attached to the stairs. Fix any carpet that is loose or worn.  Avoid having throw rugs at the top or bottom of the stairs. If you do have throw rugs, attach them to the floor with carpet tape.  Make sure that you have a light switch at the top of the stairs and the bottom of the stairs. If you do not have them, ask someone to add them for you. What else can I do to help prevent falls?  Wear shoes that:  Do not have high heels.  Have rubber bottoms.  Are comfortable and fit you well.  Are closed at the toe. Do not wear sandals.  If you use a stepladder:  Make sure that it is fully opened. Do not climb a closed stepladder.  Make sure that both sides of the stepladder are locked into place.  Ask someone to hold it for you, if possible.  Clearly mark and make sure that you can see:  Any grab bars or handrails.  First and last steps.  Where the edge of each step is.  Use tools that help you move around (mobility aids) if they are needed. These include:  Canes.  Walkers.  Scooters.  Crutches.  Turn on the lights when you go into a dark area. Replace any light bulbs as soon as they burn out.  Set up your furniture so you have a clear path. Avoid moving your furniture around.  If any of your floors are uneven, fix them.  If there are any pets around you, be aware of where they are.  Review  your medicines with your doctor. Some medicines  can make you feel dizzy. This can increase your chance of falling. Ask your doctor what other things that you can do to help prevent falls. This information is not intended to replace advice given to you by your health care provider. Make sure you discuss any questions you have with your health care provider. Document Released: 04/06/2009 Document Revised: 11/16/2015 Document Reviewed: 07/15/2014 Elsevier Interactive Patient Education  2017 [employer].

## 2020-08-11 ENCOUNTER — Other Ambulatory Visit: Payer: Self-pay | Admitting: Internal Medicine

## 2020-08-11 DIAGNOSIS — G250 Essential tremor: Secondary | ICD-10-CM

## 2020-08-11 DIAGNOSIS — E785 Hyperlipidemia, unspecified: Secondary | ICD-10-CM

## 2020-08-11 DIAGNOSIS — G4700 Insomnia, unspecified: Secondary | ICD-10-CM

## 2020-08-17 DIAGNOSIS — E119 Type 2 diabetes mellitus without complications: Secondary | ICD-10-CM | POA: Diagnosis not present

## 2020-08-17 LAB — HM DIABETES EYE EXAM

## 2020-08-22 ENCOUNTER — Ambulatory Visit: Payer: Medicare Other | Admitting: Internal Medicine

## 2020-08-25 ENCOUNTER — Encounter: Payer: Self-pay | Admitting: Internal Medicine

## 2020-08-26 ENCOUNTER — Other Ambulatory Visit: Payer: Self-pay | Admitting: Internal Medicine

## 2020-08-26 DIAGNOSIS — G4700 Insomnia, unspecified: Secondary | ICD-10-CM

## 2020-08-29 ENCOUNTER — Other Ambulatory Visit: Payer: Self-pay | Admitting: Internal Medicine

## 2020-08-29 ENCOUNTER — Other Ambulatory Visit: Payer: Self-pay

## 2020-08-29 ENCOUNTER — Encounter: Payer: Self-pay | Admitting: Internal Medicine

## 2020-08-29 ENCOUNTER — Ambulatory Visit (INDEPENDENT_AMBULATORY_CARE_PROVIDER_SITE_OTHER): Payer: Medicare Other | Admitting: Internal Medicine

## 2020-08-29 VITALS — BP 118/82 | HR 64 | Temp 97.4°F | Ht 62.0 in | Wt 157.2 lb

## 2020-08-29 DIAGNOSIS — E875 Hyperkalemia: Secondary | ICD-10-CM

## 2020-08-29 DIAGNOSIS — H6122 Impacted cerumen, left ear: Secondary | ICD-10-CM

## 2020-08-29 DIAGNOSIS — E039 Hypothyroidism, unspecified: Secondary | ICD-10-CM | POA: Diagnosis not present

## 2020-08-29 DIAGNOSIS — M797 Fibromyalgia: Secondary | ICD-10-CM | POA: Diagnosis not present

## 2020-08-29 DIAGNOSIS — G4700 Insomnia, unspecified: Secondary | ICD-10-CM

## 2020-08-29 DIAGNOSIS — E1159 Type 2 diabetes mellitus with other circulatory complications: Secondary | ICD-10-CM | POA: Diagnosis not present

## 2020-08-29 DIAGNOSIS — E785 Hyperlipidemia, unspecified: Secondary | ICD-10-CM

## 2020-08-29 DIAGNOSIS — G250 Essential tremor: Secondary | ICD-10-CM | POA: Diagnosis not present

## 2020-08-29 DIAGNOSIS — E1165 Type 2 diabetes mellitus with hyperglycemia: Secondary | ICD-10-CM | POA: Diagnosis not present

## 2020-08-29 DIAGNOSIS — Z124 Encounter for screening for malignant neoplasm of cervix: Secondary | ICD-10-CM

## 2020-08-29 DIAGNOSIS — L409 Psoriasis, unspecified: Secondary | ICD-10-CM | POA: Diagnosis not present

## 2020-08-29 DIAGNOSIS — Z1211 Encounter for screening for malignant neoplasm of colon: Secondary | ICD-10-CM

## 2020-08-29 DIAGNOSIS — J309 Allergic rhinitis, unspecified: Secondary | ICD-10-CM

## 2020-08-29 DIAGNOSIS — Z1231 Encounter for screening mammogram for malignant neoplasm of breast: Secondary | ICD-10-CM

## 2020-08-29 DIAGNOSIS — I152 Hypertension secondary to endocrine disorders: Secondary | ICD-10-CM | POA: Diagnosis not present

## 2020-08-29 LAB — LIPID PANEL
Cholesterol: 152 mg/dL (ref 0–200)
HDL: 49.6 mg/dL (ref >39.00)
LDL Cholesterol: 76 mg/dL (ref 0–99)
NonHDL: 101.95
Total CHOL/HDL Ratio: 3
Triglycerides: 131 mg/dL (ref 0.0–149.0)
VLDL: 26.2 mg/dL (ref 0.0–40.0)

## 2020-08-29 LAB — CBC WITH DIFFERENTIAL/PLATELET
Basophils Absolute: 0.1 10*3/uL (ref 0.0–0.1)
Basophils Relative: 1.2 % (ref 0.0–3.0)
Eosinophils Absolute: 0.2 10*3/uL (ref 0.0–0.7)
Eosinophils Relative: 1.8 % (ref 0.0–5.0)
HCT: 42.1 % (ref 36.0–46.0)
Hemoglobin: 14 g/dL (ref 12.0–15.0)
Lymphocytes Relative: 25.5 % (ref 12.0–46.0)
Lymphs Abs: 2.7 10*3/uL (ref 0.7–4.0)
MCHC: 33.3 g/dL (ref 30.0–36.0)
MCV: 91.9 fl (ref 78.0–100.0)
Monocytes Absolute: 0.7 10*3/uL (ref 0.1–1.0)
Monocytes Relative: 6.6 % (ref 3.0–12.0)
Neutro Abs: 6.8 10*3/uL (ref 1.4–7.7)
Neutrophils Relative %: 64.9 % (ref 43.0–77.0)
Platelets: 361 10*3/uL (ref 150.0–400.0)
RBC: 4.58 Mil/uL (ref 3.87–5.11)
RDW: 12.5 % (ref 11.5–15.5)
WBC: 10.6 10*3/uL — ABNORMAL HIGH (ref 4.0–10.5)

## 2020-08-29 LAB — TSH: TSH: 0.29 u[IU]/mL — ABNORMAL LOW (ref 0.35–4.50)

## 2020-08-29 LAB — COMPREHENSIVE METABOLIC PANEL
ALT: 32 U/L (ref 0–35)
AST: 29 U/L (ref 0–37)
Albumin: 4.4 g/dL (ref 3.5–5.2)
Alkaline Phosphatase: 70 U/L (ref 39–117)
BUN: 9 mg/dL (ref 6–23)
CO2: 28 mEq/L (ref 19–32)
Calcium: 10.1 mg/dL (ref 8.4–10.5)
Chloride: 102 mEq/L (ref 96–112)
Creatinine, Ser: 1.06 mg/dL (ref 0.40–1.20)
GFR: 61.69 mL/min (ref >60.00)
Glucose, Bld: 114 mg/dL — ABNORMAL HIGH (ref 70–99)
Potassium: 5.2 mEq/L — ABNORMAL HIGH (ref 3.5–5.1)
Sodium: 138 mEq/L (ref 135–145)
Total Bilirubin: 0.5 mg/dL (ref 0.2–1.2)
Total Protein: 6.9 g/dL (ref 6.0–8.3)

## 2020-08-29 LAB — HEMOGLOBIN A1C: Hgb A1c MFr Bld: 6.7 % — ABNORMAL HIGH (ref 4.6–6.5)

## 2020-08-29 MED ORDER — LEVOTHYROXINE SODIUM 112 MCG PO TABS
ORAL_TABLET | ORAL | 3 refills | Status: DC
Start: 2020-08-29 — End: 2021-02-03

## 2020-08-29 MED ORDER — HALOBETASOL PROPIONATE 0.05 % EX CREA: TOPICAL_CREAM | Freq: Two times a day (BID) | CUTANEOUS | 5 refills | Status: DC

## 2020-08-29 MED ORDER — ALCLOMETASONE DIPROPIONATE 0.05 % EX CREA: TOPICAL_CREAM | Freq: Two times a day (BID) | CUTANEOUS | 5 refills | Status: DC

## 2020-08-29 MED ORDER — MONTELUKAST SODIUM 10 MG PO TABS: 10.0000 mg | ORAL_TABLET | Freq: Every day | ORAL | 3 refills | Status: DC

## 2020-08-29 MED ORDER — CETIRIZINE HCL 10 MG PO TABS: 10.0000 mg | ORAL_TABLET | Freq: Every day | ORAL | 3 refills | Status: DC | PRN

## 2020-08-29 MED ORDER — TRAZODONE HCL 100 MG PO TABS: 50.0000 mg | ORAL_TABLET | Freq: Every evening | ORAL | 3 refills | Status: DC | PRN

## 2020-08-29 MED ORDER — LEVOTHYROXINE SODIUM 112 MCG PO TABS: ORAL_TABLET | ORAL | 3 refills | Status: DC

## 2020-08-29 MED ORDER — JANUMET XR 50-1000 MG PO TB24
1.0000 | ORAL_TABLET | Freq: Every day | ORAL | 3 refills | Status: DC
Start: 2020-08-29 — End: 2020-11-15

## 2020-08-29 MED ORDER — GABAPENTIN 300 MG PO CAPS: 300.0000 mg | ORAL_CAPSULE | Freq: Every day | ORAL | 3 refills | Status: DC

## 2020-08-29 MED ORDER — ATORVASTATIN CALCIUM 40 MG PO TABS: 40.0000 mg | ORAL_TABLET | Freq: Every day | ORAL | 3 refills | Status: DC

## 2020-08-29 MED ORDER — PROPRANOLOL HCL 20 MG PO TABS: 20.0000 mg | ORAL_TABLET | Freq: Two times a day (BID) | ORAL | 3 refills | Status: DC

## 2020-08-29 NOTE — Patient Instructions (Addendum)
Cetaphil/cerave cream  Gabapentin 300 mg at night -make sure they cancel 100 mg dose  For tremor if not better call neurology or call me back if you want to increase gabapentin    Essential Tremor A tremor is trembling or shaking that a person cannot control. Most tremors affect the hands or arms. Tremors can also affect the head, vocal cords, legs, and other parts of the body. Essential tremor is a tremor without a known cause. Usually, it occurs while a person is trying to perform an action. It tends to get worse gradually as a person ages. What are the causes? The cause of this condition is not known. What increases the risk? You are more likely to develop this condition if:  You have a family member with essential tremor.  You are age 76 or older.  You take certain medicines. What are the signs or symptoms? The main sign of a tremor is a rhythmic shaking of certain parts of your body that is uncontrolled and unintentional. You may:  Have difficulty eating with a spoon or fork.  Have difficulty writing.  Nod your head up and down or side to side.  Have a quivering voice. The shaking may:  Get worse over time.  Come and go.  Be more noticeable on one side of your body.  Get worse due to stress, fatigue, caffeine, and extreme heat or cold. How is this diagnosed? This condition may be diagnosed based on:  Your symptoms and medical history.  A physical exam. There is no single test to diagnose an essential tremor. However, your health care provider may order tests to rule out other causes of your condition. These may include:  Blood and urine tests.  Imaging studies of your brain, such as CT scan and MRI.  A test that measures involuntary muscle movement (electromyogram).   How is this treated? Treatment for essential tremor depends on the severity of the condition.  Some tremors may go away without treatment.  Mild tremors may not need treatment if they do not  affect your day-to-day life.  Severe tremors may need to be treated using one or more of the following options: ? Medicines. ? Lifestyle changes. ? Occupational or physical therapy. Follow these instructions at home: Lifestyle  Do not use any products that contain nicotine or tobacco, such as cigarettes and e-cigarettes. If you need help quitting, ask your health care provider.  Limit your caffeine intake as told by your health care provider.  Try to get 8 hours of sleep each night.  Find ways to manage your stress that fits your lifestyle and personality. Consider trying meditation or yoga.  Try to anticipate stressful situations and allow extra time to manage them.  If you are struggling emotionally with the effects of your tremor, consider working with a mental health provider.   General instructions  Take over-the-counter and prescription medicines only as told by your health care provider.  Avoid extreme heat and extreme cold.  Keep all follow-up visits as told by your health care provider. This is important. Visits may include physical therapy visits. Contact a health care provider if:  You experience any changes in the location or intensity of your tremors.  You start having a tremor after starting a new medicine.  You have tremor with other symptoms, such as: ? Numbness. ? Tingling. ? Pain. ? Weakness.  Your tremor gets worse.  Your tremor interferes with your daily life.  You feel down, blue, or  sad for at least 2 weeks in a row.  Worrying about your tremor and what other people think about you interferes with your everyday life functions, including relationships, work, or school. Summary  Essential tremor is a tremor without a known cause. Usually, it occurs when you are trying to perform an action.  You are more likely to develop this condition if you have a family member with essential tremor.  The main sign of a tremor is a rhythmic shaking of certain  parts of your body that is uncontrolled and unintentional.  Treatment for essential tremor depends on the severity of the condition. This information is not intended to replace advice given to you by your health care provider. Make sure you discuss any questions you have with your health care provider. Document Revised: 03/03/2020 Document Reviewed: 03/03/2020 Elsevier Patient Education  2021 Else[employer] Inc[employer]  Primidone tablets-used for tremor  What is this medicine? PRIMIDONE (PRI mi done) is a barbiturate. This medicine is used to control seizures in certain types of epilepsy. It is not for use in absence (petit mal) seizures. This medicine may be used for other purposes; ask your health care provider or pharmacist if you have questions. COMMON BRAND NAME(S): Mysoline What should I tell my health care provider before I take this medicine? They need to know if you have any of these conditions:  kidney disease  liver disease  porphyria  suicidal thoughts, plans, or attempt; a previous suicide attempt by you or a family member  an unusual or allergic reaction to primidone, phenobarbital, other barbiturates or seizure medications, other medicines, foods, dyes, or preservatives  pregnant or trying to get pregnant  breast-feeding How should I use this medicine? Take this medicine by mouth with a glass of water. Follow the directions on the prescription label. Take your doses at regular intervals. Do not take your medicine more often than directed. Do not stop taking except on the advice of your doctor or health care professional. A special MedGuide will be given to you by the pharmacist with each prescription and refill. Be sure to read this information carefully each time. Contact your pediatrician or health care professional regarding the use of this medication in children. Special care may be needed. While this drug may be prescribed for children for selected conditions, precautions do  apply. Overdosage: If you think you have taken too much of this medicine contact a poison control center or emergency room at once. NOTE: This medicine is only for you. Do not share this medicine with others. What if I miss a dose? If you miss a dose, take it as soon as you can. If it is almost time for your next dose, take only that dose. Do not take double or extra doses. What may interact with this medicine? Do not take this medicine with any of the following medications:  voriconazole This medicine may also interact with the following medications:  cancer-treating medications  cyclosporine  disopyramide  doxycycline  female hormones, including contraceptive or birth control pills  medicines for mental depression, anxiety or other mood problems  medicines for treating HIV infection or AIDS  modafinil  prescription pain medications  quinidine  warfarin This list may not describe all possible interactions. Give your health care provider a list of all the medicines, herbs, non-prescription drugs, or dietary supplements you use. Also tell them if you smoke, drink alcohol, or use illegal drugs. Some items may interact with your medicine. What should I watch for  while using this medicine? Visit your doctor or health care professional for regular checks on your progress. It may be 2 to 3 weeks before you see the full effects of this medicine. Do not suddenly stop taking this medicine, you may increase the risk of seizures. Your doctor or health care professional may want to gradually reduce the dose. Wear a medical identification bracelet or chain to say you have epilepsy, and carry a card that lists all your medications. You may get drowsy or dizzy. Do not drive, use machinery, or do anything that needs mental alertness until you know how this medicine affects you. Do not stand or sit up quickly, especially if you are an older patient. This reduces the risk of dizzy or fainting  spells. Alcohol may interfere with the effect of this medicine. Avoid alcoholic drinks. Birth control pills may not work properly while you are taking this medicine. Talk to your doctor about using an extra method of birth control. The use of this medicine may increase the chance of suicidal thoughts or actions. Pay special attention to how you are responding while on this medicine. Any worsening of mood, or thoughts of suicide or dying should be reported to your health care professional right away. Women who become pregnant while using this medicine may enroll in the [place] Antiepileptic Drug Pregnancy Registry by calling [id number]. This registry collects information about the safety of antiepileptic drug use during pregnancy. This medicine may cause a decrease in vitamin D and folic acid. You should make sure that you get enough vitamins while you are taking this medicine. Discuss the foods you eat and the vitamins you take with your health care professional. What side effects may I notice from receiving this medicine? Side effects that you should report to your doctor or health care professional as soon as possible:  allergic reactions like skin rash, itching or hives, swelling of the face, lips, or tongue  blurred, double vision, or uncontrollable rolling or movements of the eyes  redness, blistering, peeling or loosening of the skin, including inside the mouth  shortness of breath or difficulty breathing  unusual excitement or restlessness, more likely in children and the elderly  unusually weak or tired  worsening of mood, thoughts or actions of suicide or dying Side effects that usually do not require medical attention (report to your doctor or health care professional if they continue or are bothersome):  clumsiness, unsteadiness, or a hang-over effect  decreased sexual ability  dizziness, drowsiness  loss of appetite  nausea or vomiting This list may not describe  all possible side effects. Call your doctor for medical advice about side effects. You may report side effects to FDA at 1-800-FDA-1088. Where should I keep my medicine? Keep out of the reach of children. This medicine may cause accidental overdose and death if it taken by other adults, children, or pets. Mix any unused medicine with a substance like cat litter or coffee grounds. Then throw the medicine away in a sealed container like a sealed bag or a coffee can with a lid. Do not use the medicine after the expiration date. Store at room temperature between 15 and 30 degrees C (59 and 86 degrees F). NOTE: This sheet is a summary. It may not cover all possible information. If you have questions about this medicine, talk to your doctor, pharmacist, or health care provider.  2021 Elsevier/Gold Standard (2017-01-22 15:21:47)

## 2020-08-29 NOTE — Progress Notes (Signed)
Chief Complaint  Patient presents with  . Follow-up    F/U, pt expressed no concerns. Has been managing BP well since last med. Adjustment.    F/u  1. HR improved since reducing propranolol for essential tremor on 20 mg bid and gabapentin 100 mg qhs but tremor worse in right hand and now in left hand est neurology Dr. Melrose Nakayama does not want to currently see but wants to increase gabapentin also disc primadone as alt tx option neurology could Rx if worse rec f/u neurology declines for now 2. Left ear wax with loss of hearing h/o perforated ear drum as a kid  3. DM 2 A1C 7.7 on janumet xr 50-1000 mg qd   Review of Systems  Constitutional: Negative for weight loss.  HENT: Negative for hearing loss.   Eyes: Negative for blurred vision.  Respiratory: Negative for shortness of breath.   Cardiovascular: Negative for chest pain.  Gastrointestinal: Negative for abdominal pain.  Musculoskeletal: Negative for falls and joint pain.  Skin: Negative for rash.       +psoriasis    Neurological: Positive for tremors. Negative for headaches.  Psychiatric/Behavioral: Negative for depression.   Past Medical History:  Diagnosis Date  . Allergy   . Anxiety   . Benign essential tremor    right side  . Depression   . Fibromyalgia   . Herniated lumbar intervertebral disc   . Hyperlipidemia   . Hypertension    history of blood pressure  . Ovarian cyst 08/21/2016   One septated cyst, one simple cyst both on left ovary Feb 2018  . Psoriasis   . Thyroid disease   . Type 2 diabetes mellitus ([place]) 11/29/2015  . Vitamin D deficiency 11/01/2015   Past Surgical History:  Procedure Laterality Date  . CESAREAN SECTION     2002/2006  . TUBAL LIGATION     Family History  Problem Relation Age of Onset  . Cancer Mother        lung not smoker   . Alzheimer's disease Father   . Dementia Father   . Parkinson's disease Father   . Drug abuse Sister   . Thyroid disease Sister   . Thyroid disease Sister   .  Cancer Sister        cervical  . Asthma Sister   . Depression Sister   . Hyperlipidemia Sister   . Thyroid disease Sister   . Depression Sister   . Thyroid disease Sister   . Depression Sister   . Asthma Sister   . Arthritis Sister   . Kidney disease Sister   . Cancer Maternal Grandmother   . Asthma Maternal Grandmother   . Cancer Maternal Grandfather   . Asthma Maternal Grandfather   . Cancer Paternal Grandmother        ?type  . Cancer Paternal Grandfather        ?type  . Early death Brother        childbirth  . Cancer Other        strong FH breast cancer 6/12 maternal relatives per pt and also colon cnacer  . Cancer Other        brain   Social History   Socioeconomic History  . Marital status: Single    Spouse name: Not on file  . Number of children: Not on file  . Years of education: Not on file  . Highest education level: Not on file  Occupational History  . Not on file  Tobacco  Use  . Smoking status: Never Smoker  . Smokeless tobacco: Never Used  Vaping Use  . Vaping Use: Never used  Substance and Sexual Activity  . Alcohol use: No    Alcohol/week: 0.0 standard drinks  . Drug use: No  . Sexual activity: Never  Other Topics Concern  . Not on file  Social History Narrative   Disabled    Some college    Owns guns   Wears seat belt, safe in relationship    Single 2 kids    Social Determinants of Health   Financial Resource Strain: Low Risk   . Difficulty of Paying Living Expenses: Not hard at all  Food Insecurity: No Food Insecurity  . Worried About Charity fundraiser in the Last Year: Never true  . Ran Out of Food in the Last Year: Never true  Transportation Needs: No Transportation Needs  . Lack of Transportation (Medical): No  . Lack of Transportation (Non-Medical): No  Physical Activity: Unknown  . Days of Exercise per Week: 0 days  . Minutes of Exercise per Session: Not on file  Stress: No Stress Concern Present  . Feeling of Stress : Not  at all  Social Connections: Unknown  . Frequency of Communication with Friends and Family: More than three times a week  . Frequency of Social Gatherings with Friends and Family: Once a week  . Attends Religious Services: Not on file  . Active Member of Clubs or Organizations: Not on file  . Attends Archivist Meetings: Not on file  . Marital Status: Not on file  Intimate Partner Violence: Not At Risk  . Fear of Current or Ex-Partner: No  . Emotionally Abused: No  . Physically Abused: No  . Sexually Abused: No   Current Meds  Medication Sig  . Cholecalciferol 100 MCG (4000 UT) CAPS Take by mouth daily.  . diclofenac sodium (VOLTAREN) 1 % GEL Apply 2 g topically 4 (four) times daily.  . [DISCONTINUED] alclomethasone (ACLOVATE) 0.05 % cream Apply topically 2 (two) times daily. Prn face as needed  . [DISCONTINUED] atorvastatin (LIPITOR) 40 MG tablet TAKE 1 TABLET BY MOUTH EVERYDAY AT BEDTIME  . [DISCONTINUED] cetirizine (ZYRTEC) 10 MG tablet Take 1 tablet (10 mg total) by mouth daily as needed for allergies.  . [DISCONTINUED] gabapentin (NEURONTIN) 100 MG capsule Take 1 capsule (100 mg total) by mouth at bedtime.  . [DISCONTINUED] halobetasol (ULTRAVATE) 0.05 % cream Apply topically 2 (two) times daily. Prn to body not face, underarms, groin  . [DISCONTINUED] levothyroxine (SYNTHROID) 112 MCG tablet 30 minute before food.  . [DISCONTINUED] montelukast (SINGULAIR) 10 MG tablet Take 1 tablet (10 mg total) by mouth at bedtime. For allergies  . [DISCONTINUED] propranolol (INDERAL) 20 MG tablet Take 1 tablet (20 mg total) by mouth 2 (two) times daily.  . [DISCONTINUED] propranolol (INDERAL) 40 MG tablet TAKE 1 TABLET (40 MG TOTAL) BY MOUTH 2 (TWO) TIMES DAILY. PER DR. POTTER  . [DISCONTINUED] SitaGLIPtin-MetFORMIN HCl (JANUMET XR) 50-1000 MG TB24 Take 1 tablet by mouth daily. D/c metformin xr 1000 mg bid  . [DISCONTINUED] traZODone (DESYREL) 100 MG tablet TAKE 0.5-1 TABLETS (50-100 MG  TOTAL) BY MOUTH AT BEDTIME AS NEEDED. FOR SLEEP  . [DISCONTINUED] traZODone (DESYREL) 50 MG tablet Take 1-2 tablets (50-100 mg total) by mouth at bedtime as needed. for sleep   Allergies  Allergen Reactions  . Lyrica [Pregabalin] Other (See Comments)    Patient reports of facial tingling Patient reports of facial  tingling  . Cymbalta [Duloxetine Hcl]     HTN, feels nothing doesn't care    Recent Results (from the past 2160 hour(s))  HM DIABETES EYE EXAM     Status: None   Collection Time: 08/17/20 12:00 AM  Result Value Ref Range   HM Diabetic Eye Exam No Retinopathy No Retinopathy    Comment: AE   Objective  Body mass index is 28.76 kg/m. Wt Readings from Last 3 Encounters:  08/29/20 157 lb 4 oz (71.3 kg)  07/21/20 161 lb (73 kg)  01/07/20 161 lb 6.4 oz (73.2 kg)   Temp Readings from Last 3 Encounters:  08/29/20 (!) 97.4 F (36.3 C)  01/07/20 98.1 F (36.7 C) (Oral)  07/08/18 98.2 F (36.8 C) (Oral)   BP Readings from Last 3 Encounters:  08/29/20 118/82  01/07/20 112/80  07/08/18 132/90   Pulse Readings from Last 3 Encounters:  08/29/20 64  01/07/20 (!) 57  07/08/18 (!) 57    Physical Exam Vitals and nursing note reviewed.  Constitutional:      Appearance: Normal appearance. She is well-developed and well-groomed.  HENT:     Head: Normocephalic and atraumatic.  Eyes:     Conjunctiva/sclera: Conjunctivae normal.     Pupils: Pupils are equal, round, and reactive to light.  Cardiovascular:     Rate and Rhythm: Normal rate and regular rhythm.     Heart sounds: Normal heart sounds. No murmur heard.   Pulmonary:     Effort: Pulmonary effort is normal.     Breath sounds: Normal breath sounds.  Abdominal:     Tenderness: There is no abdominal tenderness.  Skin:    General: Skin is warm and dry.  Neurological:     General: No focal deficit present.     Mental Status: She is alert and oriented to person, place, and time. Mental status is at baseline.      Gait: Gait normal.  Psychiatric:        Attention and Perception: Attention and perception normal.        Mood and Affect: Mood and affect normal.        Speech: Speech normal.        Behavior: Behavior normal. Behavior is cooperative.        Thought Content: Thought content normal.        Cognition and Memory: Cognition and memory normal.        Judgment: Judgment normal.     Assessment  Plan  Hypertension associated with diabetes ([place]) - Plan: Comprehensive metabolic panel, Lipid panel, Hemoglobin A1c, Urinalysis, Routine w reflex microscopic, Microalbumin / creatinine urine ratio, CBC w/Diff, SitaGLIPtin-MetFORMIN HCl (JANUMET XR) 50-1000 MG TB24  Hypothyroidism, unspecified type - Plan: TSH, levothyroxine (SYNTHROID) 112 MCG tablet   Essential tremor - Plan: gabapentin (NEURONTIN) 300 MG  Capsule qhs increased from 100 mg qhs, propranolol (INDERAL) 20 MG tablet Fibromyalgia - Plan: gabapentin (NEURONTIN) 300 MG capsule  Insomnia, unspecified type - Plan: traZODone (DESYREL) 50- 100 MG tablet  Type 2 diabetes mellitus with hyperglycemia, without long-term current use of insulin ([facility]) - Plan: SitaGLIPtin-MetFORMIN HCl (JANUMET XR) 50-1000 MG TB24  Allergic rhinitis, unspecified seasonality, unspecified trigger - Plan: montelukast (SINGULAIR) 10 MG tablet, cetirizine (ZYRTEC) 10 MG tablet  Psoriasis - Plan: halobetasol (ULTRAVATE) 0.05 % cream, alclomethasone (ACLOVATE) 0.05 % cream  Hyperlipidemia, unspecified hyperlipidemia type - Plan: atorvastatin (LIPITOR) 40 MG tablet   Left cerumen impaction with hearing loss  Consented and tolerated removal of  wax with currette only all wax removed   HM Declines flu shot sick and passed out x 3 times  Tdaputd consider pna 23 vx in futuregiven DM 2, declines due to flu shot side effect rec MMR vaccine and hep B vaccine moderna 2/2 declines booster  Referred for mammogram prev FH breast and colon cancerpt needs to sch -order in  encouraged to schedule   Colonoscopy referred [facility]  Pap 08/15/17 neg pap neg HPV[facility] saw 09/02/2018 f/u in 1 year -referred [facility] ob/gyn   Never smoker, no etoh   Eye [place] 08/17/20 neg AE Dr. Neville Route normal neg retinopathy   Dermatology consider in future as of 08/29/20 no need   Neurology Dr. Melrose Nakayama for tremor call if tremor worse   Provider: Dr. Olivia Mackie McLean-Scocuzza-Internal Medicine

## 2020-08-30 LAB — URINALYSIS, ROUTINE W REFLEX MICROSCOPIC
Bilirubin, UA: NEGATIVE
Glucose, UA: NEGATIVE
Ketones, UA: NEGATIVE
Nitrite, UA: NEGATIVE
Protein,UA: NEGATIVE
RBC, UA: NEGATIVE
Specific Gravity, UA: 1.007 (ref 1.005–1.030)
Urobilinogen, Ur: 0.2 mg/dL (ref 0.2–1.0)
pH, UA: 5.5 (ref 5.0–7.5)

## 2020-08-30 LAB — MICROSCOPIC EXAMINATION
Bacteria, UA: NONE SEEN
Casts: NONE SEEN /lpf

## 2020-08-30 LAB — MICROALBUMIN / CREATININE URINE RATIO
Creatinine, Urine: 66.3 mg/dL
Microalb/Creat Ratio: <5 mg/g creat (ref 0–29)
Microalbumin, Urine: <3 ug/mL

## 2020-08-31 ENCOUNTER — Telehealth: Payer: Self-pay

## 2020-08-31 NOTE — Telephone Encounter (Signed)
LMTCB for labs. 

## 2020-09-05 ENCOUNTER — Other Ambulatory Visit: Payer: Self-pay

## 2020-09-05 DIAGNOSIS — E875 Hyperkalemia: Secondary | ICD-10-CM

## 2020-09-05 NOTE — Telephone Encounter (Signed)
Pt called back about lab results.  

## 2020-09-05 NOTE — Telephone Encounter (Signed)
Called patient, but unable to reach & VM was not set up.

## 2020-09-05 NOTE — Telephone Encounter (Signed)
See result note. Labs reviewed with patient.

## 2020-09-13 ENCOUNTER — Other Ambulatory Visit (INDEPENDENT_AMBULATORY_CARE_PROVIDER_SITE_OTHER): Payer: Medicare Other

## 2020-09-13 ENCOUNTER — Other Ambulatory Visit: Payer: Self-pay

## 2020-09-13 ENCOUNTER — Telehealth: Payer: Self-pay

## 2020-09-13 DIAGNOSIS — E875 Hyperkalemia: Secondary | ICD-10-CM

## 2020-09-13 LAB — BASIC METABOLIC PANEL
BUN: 9 mg/dL (ref 6–23)
CO2: 26 mEq/L (ref 19–32)
Calcium: 9.6 mg/dL (ref 8.4–10.5)
Chloride: 103 mEq/L (ref 96–112)
Creatinine, Ser: 1.02 mg/dL (ref 0.40–1.20)
GFR: 64.58 mL/min (ref >60.00)
Glucose, Bld: 148 mg/dL — ABNORMAL HIGH (ref 70–99)
Potassium: 4.4 mEq/L (ref 3.5–5.1)
Sodium: 138 mEq/L (ref 135–145)

## 2020-09-13 NOTE — Telephone Encounter (Signed)
err

## 2020-09-14 ENCOUNTER — Encounter: Payer: Self-pay | Admitting: Internal Medicine

## 2020-11-07 DIAGNOSIS — Z124 Encounter for screening for malignant neoplasm of cervix: Secondary | ICD-10-CM | POA: Diagnosis not present

## 2020-11-07 DIAGNOSIS — Z1239 Encounter for other screening for malignant neoplasm of breast: Secondary | ICD-10-CM | POA: Diagnosis not present

## 2020-11-07 DIAGNOSIS — Z01419 Encounter for gynecological examination (general) (routine) without abnormal findings: Secondary | ICD-10-CM | POA: Diagnosis not present

## 2020-11-07 DIAGNOSIS — Z1331 Encounter for screening for depression: Secondary | ICD-10-CM | POA: Diagnosis not present

## 2020-11-07 LAB — HM PAP SMEAR: HM Pap smear: NEGATIVE

## 2020-11-15 ENCOUNTER — Other Ambulatory Visit: Payer: Self-pay

## 2020-11-15 ENCOUNTER — Telehealth: Payer: Self-pay | Admitting: Internal Medicine

## 2020-11-15 DIAGNOSIS — I152 Hypertension secondary to endocrine disorders: Secondary | ICD-10-CM

## 2020-11-15 DIAGNOSIS — E119 Type 2 diabetes mellitus without complications: Secondary | ICD-10-CM

## 2020-11-15 DIAGNOSIS — E1159 Type 2 diabetes mellitus with other circulatory complications: Secondary | ICD-10-CM

## 2020-11-15 DIAGNOSIS — E1165 Type 2 diabetes mellitus with hyperglycemia: Secondary | ICD-10-CM

## 2020-11-15 MED ORDER — JANUMET XR 50-1000 MG PO TB24: 1.0000 | ORAL_TABLET | Freq: Every day | ORAL | 3 refills | Status: DC

## 2020-11-15 NOTE — Telephone Encounter (Signed)
PT is calling in to request a refill of their SitaGLIPtin-MetFORMIN HCl (JANUMET XR) 50-1000 MG TB24 as they are out of meds and they need a refill.

## 2020-11-15 NOTE — Telephone Encounter (Signed)
Called and informed the Patient that today Janumet was sent in to [facility] [address] for her. Informed the Patient that this pill is a combination of Januvia and Metformin.   Patient verbalized understanding and states she will contact her pharmacy

## 2020-11-15 NOTE — Telephone Encounter (Signed)
PT is currently at the GI and states that her GI told her that something had change with her metformin that she is either not suppose to be taking it or the dosage change and would like clearance on it.

## 2020-11-27 ENCOUNTER — Other Ambulatory Visit: Payer: Self-pay | Admitting: Internal Medicine

## 2020-11-27 DIAGNOSIS — M797 Fibromyalgia: Secondary | ICD-10-CM

## 2020-11-27 DIAGNOSIS — G250 Essential tremor: Secondary | ICD-10-CM

## 2021-01-03 ENCOUNTER — Other Ambulatory Visit: Payer: Self-pay

## 2021-01-03 ENCOUNTER — Ambulatory Visit
Admission: RE | Admit: 2021-01-03 | Discharge: 2021-01-03 | Disposition: A | Discharge status: Home / Self Care | Payer: Medicare Other | Source: Ambulatory Visit | Attending: Internal Medicine | Admitting: Internal Medicine

## 2021-01-03 DIAGNOSIS — Z1231 Encounter for screening mammogram for malignant neoplasm of breast: Secondary | ICD-10-CM | POA: Insufficient documentation

## 2021-01-08 ENCOUNTER — Other Ambulatory Visit: Payer: Self-pay | Admitting: Internal Medicine

## 2021-01-08 DIAGNOSIS — R928 Other abnormal and inconclusive findings on diagnostic imaging of breast: Secondary | ICD-10-CM

## 2021-01-08 DIAGNOSIS — N6320 Unspecified lump in the left breast, unspecified quadrant: Secondary | ICD-10-CM

## 2021-01-10 NOTE — Progress Notes (Signed)
Left message to return call 

## 2021-01-15 ENCOUNTER — Ambulatory Visit
Admission: RE | Admit: 2021-01-15 | Discharge: 2021-01-15 | Disposition: A | Discharge status: Home / Self Care | Payer: Medicare Other | Source: Ambulatory Visit | Attending: Internal Medicine | Admitting: Internal Medicine

## 2021-01-15 ENCOUNTER — Other Ambulatory Visit: Payer: Self-pay | Admitting: Internal Medicine

## 2021-01-15 ENCOUNTER — Other Ambulatory Visit: Payer: Self-pay

## 2021-01-15 DIAGNOSIS — R928 Other abnormal and inconclusive findings on diagnostic imaging of breast: Secondary | ICD-10-CM | POA: Insufficient documentation

## 2021-01-15 DIAGNOSIS — N6320 Unspecified lump in the left breast, unspecified quadrant: Secondary | ICD-10-CM | POA: Insufficient documentation

## 2021-01-15 DIAGNOSIS — R922 Inconclusive mammogram: Secondary | ICD-10-CM | POA: Diagnosis not present

## 2021-01-16 ENCOUNTER — Other Ambulatory Visit: Payer: Self-pay | Admitting: Internal Medicine

## 2021-01-16 DIAGNOSIS — E119 Type 2 diabetes mellitus without complications: Secondary | ICD-10-CM

## 2021-01-18 ENCOUNTER — Telehealth: Payer: Self-pay | Admitting: Internal Medicine

## 2021-01-18 NOTE — Telephone Encounter (Signed)
Patient called office back. Patient stated if office is calling for her mammogram results, she already knows and is having a biopsy tomorrow.

## 2021-01-18 NOTE — Progress Notes (Signed)
Left message for patient to return call back for Mammo results.

## 2021-01-19 ENCOUNTER — Ambulatory Visit
Admission: RE | Admit: 2021-01-19 | Discharge: 2021-01-19 | Disposition: A | Discharge status: Home / Self Care | Payer: Medicare Other | Source: Ambulatory Visit | Attending: Internal Medicine | Admitting: Internal Medicine

## 2021-01-19 ENCOUNTER — Other Ambulatory Visit: Payer: Self-pay

## 2021-01-19 DIAGNOSIS — R928 Other abnormal and inconclusive findings on diagnostic imaging of breast: Secondary | ICD-10-CM | POA: Insufficient documentation

## 2021-01-19 DIAGNOSIS — N6032 Fibrosclerosis of left breast: Secondary | ICD-10-CM | POA: Diagnosis not present

## 2021-01-19 HISTORY — PX: BREAST BIOPSY: SHX20

## 2021-01-22 LAB — SURGICAL PATHOLOGY

## 2021-01-23 ENCOUNTER — Encounter: Payer: Self-pay | Admitting: *Deleted

## 2021-01-23 NOTE — Progress Notes (Signed)
Received message from Randa Lynn, RN that patient had been informed of her benign breast biopsy and need for surgical consultation, and is ready for navigation to call her.   Called patient.  She would like to see a Careers adviser at [facility].  I have her scheduled for a surgical consult with Dr. Maia Plan on 02/06/21 @ 9:15.

## 2021-01-29 ENCOUNTER — Telehealth: Payer: Self-pay

## 2021-01-29 NOTE — Telephone Encounter (Signed)
LMTCB in regards to results.  

## 2021-01-30 NOTE — Telephone Encounter (Signed)
Patient is returning your call.  

## 2021-01-30 NOTE — Telephone Encounter (Signed)
Patient informed and verbalized understanding of breast imaging results.

## 2021-01-30 NOTE — Telephone Encounter (Signed)
Left message to return call.  See result note for breast imaging

## 2021-01-30 NOTE — Telephone Encounter (Signed)
Patient is returning your call to go over results.

## 2021-01-30 NOTE — Progress Notes (Signed)
Left message to return call 

## 2021-01-31 ENCOUNTER — Other Ambulatory Visit: Payer: Self-pay

## 2021-01-31 ENCOUNTER — Ambulatory Visit (INDEPENDENT_AMBULATORY_CARE_PROVIDER_SITE_OTHER): Payer: Medicare Other | Admitting: Internal Medicine

## 2021-01-31 ENCOUNTER — Encounter: Payer: Self-pay | Admitting: Internal Medicine

## 2021-01-31 VITALS — BP 110/80 | HR 71 | Temp 96.3°F | Ht 62.0 in | Wt 158.8 lb

## 2021-01-31 DIAGNOSIS — E559 Vitamin D deficiency, unspecified: Secondary | ICD-10-CM

## 2021-01-31 DIAGNOSIS — I152 Hypertension secondary to endocrine disorders: Secondary | ICD-10-CM | POA: Diagnosis not present

## 2021-01-31 DIAGNOSIS — E1159 Type 2 diabetes mellitus with other circulatory complications: Secondary | ICD-10-CM

## 2021-01-31 DIAGNOSIS — E039 Hypothyroidism, unspecified: Secondary | ICD-10-CM | POA: Diagnosis not present

## 2021-01-31 LAB — CBC WITH DIFFERENTIAL/PLATELET
Basophils Absolute: 0 10*3/uL (ref 0.0–0.1)
Basophils Relative: 0.6 % (ref 0.0–3.0)
Eosinophils Absolute: 0.2 10*3/uL (ref 0.0–0.7)
Eosinophils Relative: 2.8 % (ref 0.0–5.0)
HCT: 38.5 % (ref 36.0–46.0)
Hemoglobin: 12.9 g/dL (ref 12.0–15.0)
Lymphocytes Relative: 21.8 % (ref 12.0–46.0)
Lymphs Abs: 1.9 10*3/uL (ref 0.7–4.0)
MCHC: 33.5 g/dL (ref 30.0–36.0)
MCV: 92.1 fl (ref 78.0–100.0)
Monocytes Absolute: 0.5 10*3/uL (ref 0.1–1.0)
Monocytes Relative: 5.7 % (ref 3.0–12.0)
Neutro Abs: 6.1 10*3/uL (ref 1.4–7.7)
Neutrophils Relative %: 69.1 % (ref 43.0–77.0)
Platelets: 351 10*3/uL (ref 150.0–400.0)
RBC: 4.17 Mil/uL (ref 3.87–5.11)
RDW: 12.3 % (ref 11.5–15.5)
WBC: 8.8 10*3/uL (ref 4.0–10.5)

## 2021-01-31 LAB — COMPREHENSIVE METABOLIC PANEL
ALT: 36 U/L — ABNORMAL HIGH (ref 0–35)
AST: 25 U/L (ref 0–37)
Albumin: 4.1 g/dL (ref 3.5–5.2)
Alkaline Phosphatase: 71 U/L (ref 39–117)
BUN: 9 mg/dL (ref 6–23)
CO2: 22 mEq/L (ref 19–32)
Calcium: 9.7 mg/dL (ref 8.4–10.5)
Chloride: 104 mEq/L (ref 96–112)
Creatinine, Ser: 1.08 mg/dL (ref 0.40–1.20)
GFR: 60.14 mL/min (ref >60.00)
Glucose, Bld: 140 mg/dL — ABNORMAL HIGH (ref 70–99)
Potassium: 4.3 mEq/L (ref 3.5–5.1)
Sodium: 137 mEq/L (ref 135–145)
Total Bilirubin: 0.3 mg/dL (ref 0.2–1.2)
Total Protein: 6.7 g/dL (ref 6.0–8.3)

## 2021-01-31 LAB — LIPID PANEL
Cholesterol: 134 mg/dL (ref 0–200)
HDL: 44.4 mg/dL (ref >39.00)
LDL Cholesterol: 68 mg/dL (ref 0–99)
NonHDL: 89.92
Total CHOL/HDL Ratio: 3
Triglycerides: 110 mg/dL (ref 0.0–149.0)
VLDL: 22 mg/dL (ref 0.0–40.0)

## 2021-01-31 LAB — VITAMIN D 25 HYDROXY (VIT D DEFICIENCY, FRACTURES): VITD: 67.87 ng/mL (ref 30.00–100.00)

## 2021-01-31 LAB — TSH: TSH: 0.05 u[IU]/mL — ABNORMAL LOW (ref 0.35–5.50)

## 2021-01-31 LAB — HEMOGLOBIN A1C: Hgb A1c MFr Bld: 6.8 % — ABNORMAL HIGH (ref 4.6–6.5)

## 2021-01-31 NOTE — Progress Notes (Signed)
Chief Complaint  Patient presents with   Follow-up   F/u  1. Colonoscopy sch 02/23/21 advised pt to keep  2. Abnormal mammogram 7/25 with bx negative but appt [facility] surgery 02/06/21 for removal cyst like lesion in breast  No issues today  3. BP controlled today  3. Hypothyroidism on levo 112 mcg and dm 2 on janumet 50-1000 mg will check labs today and lipitor 40 mg qhs   Review of Systems  Constitutional:  Negative for weight loss.  HENT:  Negative for hearing loss.   Eyes:  Negative for blurred vision.  Respiratory:  Negative for shortness of breath.   Cardiovascular:  Negative for chest pain.  Gastrointestinal:  Negative for abdominal pain.  Skin:  Negative for rash.  Neurological:  Negative for dizziness and headaches.  Psychiatric/Behavioral:  Negative for depression.   Past Medical History:  Diagnosis Date   Allergy    Anxiety    Benign essential tremor    right side   Depression    Fibromyalgia    Herniated lumbar intervertebral disc    Hyperlipidemia    Hypertension    history of blood pressure   Ovarian cyst 08/21/2016   One septated cyst, one simple cyst both on left ovary Feb 2018   Psoriasis    Thyroid disease    Type 2 diabetes mellitus ([place]) 11/29/2015   Vitamin D deficiency 11/01/2015   Past Surgical History:  Procedure Laterality Date   BREAST BIOPSY Left 01/19/2021   left breast stereo distortion path pending   CESAREAN SECTION     2002/2006   TUBAL LIGATION     Family History  Problem Relation Age of Onset   Cancer Mother        lung not smoker    Alzheimer's disease Father    Dementia Father    Parkinson's disease Father    Drug abuse Sister    Thyroid disease Sister    Thyroid disease Sister    Cancer Sister        cervical   Asthma Sister    Depression Sister    Hyperlipidemia Sister    Thyroid disease Sister    Depression Sister    Thyroid disease Sister    Depression Sister    Asthma Sister    Arthritis Sister    Kidney disease Sister     Breast cancer Maternal Aunt 40   Cancer Maternal Grandmother    Asthma Maternal Grandmother    Cancer Maternal Grandfather    Asthma Maternal Grandfather    Cancer Paternal Grandmother        ?type   Cancer Paternal Grandfather        ?type   Early death Brother        childbirth   Cancer Other        strong FH breast cancer 6/12 maternal relatives per pt and also colon cnacer   Cancer Other        brain   Social History   Socioeconomic History   Marital status: Single    Spouse name: Not on file   Number of children: Not on file   Years of education: Not on file   Highest education level: Not on file  Occupational History   Not on file  Tobacco Use   Smoking status: Never   Smokeless tobacco: Never  Vaping Use   Vaping Use: Never used  Substance and Sexual Activity   Alcohol use: No    Alcohol/week: 0.0  standard drinks   Drug use: No   Sexual activity: Never  Other Topics Concern   Not on file  Social History Narrative   Disabled    Some college    Owns guns   Wears seat belt, safe in relationship    Single 2 kids    Social Determinants of Health   Financial Resource Strain: Low Risk    Difficulty of Paying Living Expenses: Not hard at all  Food Insecurity: No Food Insecurity   Worried About Charity fundraiser in the Last Year: Never true   Arboriculturist in the Last Year: Never true  Transportation Needs: No Transportation Needs   Lack of Transportation (Medical): No   Lack of Transportation (Non-Medical): No  Physical Activity: Unknown   Days of Exercise per Week: 0 days   Minutes of Exercise per Session: Not on file  Stress: No Stress Concern Present   Feeling of Stress : Not at all  Social Connections: Unknown   Frequency of Communication with Friends and Family: More than three times a week   Frequency of Social Gatherings with Friends and Family: Once a week   Attends Religious Services: Not on Electrical engineer or Organizations:  Not on file   Attends Archivist Meetings: Not on file   Marital Status: Not on file  Intimate Partner Violence: Not At Risk   Fear of Current or Ex-Partner: No   Emotionally Abused: No   Physically Abused: No   Sexually Abused: No   Current Meds  Medication Sig   alclomethasone (ACLOVATE) 0.05 % cream Apply topically 2 (two) times daily. Prn face as needed   atorvastatin (LIPITOR) 40 MG tablet Take 1 tablet (40 mg total) by mouth daily. After 6 pm   cetirizine (ZYRTEC) 10 MG tablet Take 1 tablet (10 mg total) by mouth daily as needed for allergies.   Cholecalciferol 100 MCG (4000 UT) CAPS Take by mouth daily.   diclofenac sodium (VOLTAREN) 1 % GEL Apply 2 g topically 4 (four) times daily.   gabapentin (NEURONTIN) 300 MG capsule Take 1 capsule (300 mg total) by mouth at bedtime.   halobetasol (ULTRAVATE) 0.05 % cream Apply topically 2 (two) times daily. Prn to body not face, underarms, groin   levothyroxine (SYNTHROID) 112 MCG tablet 30 minute before food Monday-Saturday .Sunday take 1/2 pill 30 min before breakfast. See change in sig   montelukast (SINGULAIR) 10 MG tablet Take 1 tablet (10 mg total) by mouth at bedtime. For allergies   propranolol (INDERAL) 20 MG tablet Take 1 tablet (20 mg total) by mouth 2 (two) times daily.   SitaGLIPtin-MetFORMIN HCl (JANUMET XR) 50-1000 MG TB24 Take 1 tablet by mouth daily. D/c metformin xr 1000 mg bid   traZODone (DESYREL) 100 MG tablet Take 0.5-1 tablets (50-100 mg total) by mouth at bedtime as needed. for sleep   Allergies  Allergen Reactions   Lyrica [Pregabalin] Other (See Comments)    Patient reports of facial tingling Patient reports of facial tingling   Cymbalta [Duloxetine Hcl]     HTN, feels nothing doesn't care    Influenza Vaccines Other (See Comments)    Patient states she gets very sick and passes out a few days after the shot.    Recent Results (from the past 2160 hour(s))  HM PAP SMEAR     Status: None   Collection  Time: 11/07/20 12:00 AM  Result Value Ref Range  HM Pap smear neg neg hpv     Comment: [facility]  Surgical pathology     Status: None   Collection Time: 01/19/21  9:16 AM  Result Value Ref Range   SURGICAL PATHOLOGY      SURGICAL PATHOLOGY CASE: [id number] PATIENT: Johnella Moloney Surgical Pathology Report     Specimen Submitted: A. Breast, left  Clinical History: Mammographically detected architectural distortion involving the lower left breast at middle depth without sonographic correlate. radial scar/CSL vs IMC (coil clip).      DIAGNOSIS: A. BREAST ASYMMETRY, LEFT LOWER AT MIDDLE DEPTH; STEREOTACTIC BIOPSY: - NODULAR AREAS OF FIBROUS STROMA WITH USUAL DUCTAL HYPERPLASIA, APOCRINE METAPLASIA, DUCT ECTASIA, AND SMALL CYST FORMATION, SEE COMMENT. - NEGATIVE FOR ATYPIA AND MALIGNANCY.  Comment: Specimen consists of mature adipose tissue with interspersed areas of fibrous stroma.  In areas the fibrous stroma is nodular and is associated with usual ductal hyperplasia, apocrine metaplasia, duct ectasia, and small cyst formation.  The differential diagnosis includes a complex fibroadenoma and a complex sclerosing lesion. Correlation with radiographic findings is required  to determine if these findings are representative of the targeted lesion.  GROSS DESCRIPTION: A. Labeled: Left breast stereo distortion lower Received: in a formalin-filled [place] collection device Specimen radiograph image(s) available for review Time/Date in fixative: Collected at 9:06 AM on 01/19/2021 and placed in formalin at 9:09 AM on 01/19/2021 Cold ischemic time: Less than 5 minutes Total fixation time: Approximately 8 hours Core pieces: Multiple Measurement: Aggregate, 4.7 x 1.2 x 0.4 cm Description / comments: Received are cores and fragments of yellow fibrofatty tissue.  A diagram indicating the location of calcifications is not received on the container with the requisition. Inked:  Green Entirely submitted in cassette(s):  1 - sections A and B 2 - sections C and D 3 - sections E, F, and G  RB 01/19/2021  Final Diagnosis performed by Quay Burow, MD.   Electronically signed 01/22/2021 9:02:00AM The electronic signature indicates that the named Attendi ng Pathologist has evaluated the specimen Technical component performed at [facility], [address] [place], [place] [address] Lab: [id number] Dir: Rush Farmer, MD, MMM  Professional component performed at [facility], [facility], [place], [place], [place] [address] Lab: [phone] Dir: Dellia Nims. Rubinas, MD    Objective  Body mass index is 29.04 kg/m. Wt Readings from Last 3 Encounters:  01/31/21 158 lb 12.8 oz (72 kg)  08/29/20 157 lb 4 oz (71.3 kg)  07/21/20 161 lb (73 kg)   Temp Readings from Last 3 Encounters:  01/31/21 (!) 96.3 F (35.7 C) (Temporal)  08/29/20 (!) 97.4 F (36.3 C)  01/07/20 98.1 F (36.7 C) (Oral)   BP Readings from Last 3 Encounters:  01/31/21 110/80  08/29/20 118/82  01/07/20 112/80   Pulse Readings from Last 3 Encounters:  01/31/21 71  08/29/20 64  01/07/20 (!) 57    Physical Exam Vitals and nursing note reviewed.  Constitutional:      Appearance: Normal appearance. She is well-developed and well-groomed.  HENT:     Head: Normocephalic and atraumatic.  Eyes:     Conjunctiva/sclera: Conjunctivae normal.     Pupils: Pupils are equal, round, and reactive to light.  Cardiovascular:     Rate and Rhythm: Normal rate and regular rhythm.     Heart sounds: Normal heart sounds. No murmur heard. Pulmonary:     Effort: Pulmonary effort is normal.     Breath sounds: Normal breath sounds.  Skin:  General: Skin is warm and dry.  Neurological:     General: No focal deficit present.     Mental Status: She is alert and oriented to person, place, and time. Mental status is at baseline.     Gait: Gait normal.  Psychiatric:        Attention and  Perception: Attention and perception normal.        Mood and Affect: Mood and affect normal.        Speech: Speech normal.        Behavior: Behavior normal. Behavior is cooperative.        Thought Content: Thought content normal.        Cognition and Memory: Cognition and memory normal.        Judgment: Judgment normal.    Assessment  Plan  Hypertension associated with diabetes ([place]) - Plan: Comprehensive metabolic panel, Lipid panel, CBC with Differential/Platelet, Hemoglobin A1c Cont janumet 50-1000, lipitor 40 mg qhs inderal 20 mg   Vitamin D deficiency - Plan: Vitamin D (25 hydroxy)  Hypothyroidism, unspecified type - Plan: TSH on levo 112 mcg   HM Declines flu shot sick and passed out x 3 times  Tdap utd consider pna 23 vx in future given DM 2, declines due to flu shot side effect rec MMR vaccine and hep B vaccine moderna 2/2 declines booster   Referred for mammogram prev FH breast and colon cancer pt needs to sch -order in encouraged to schedule  01/15/21 abnormal mammogram bx neg malignancy f/u surgery 02/06/21    Colonoscopy referred [facility] 02/23/21 [facility]    Pap 08/15/17 neg pap neg [facility] saw 09/02/2018 f/u in 1 year   -referred [facility] ob/gyn 11/07/20 neg neg hpv [facility]   Never smoker, no etoh   Eye [place] 08/17/20 neg AE Dr. Neville Route normal neg retinopathy   Dermatology consider in future as of 08/29/20 no need   Neurology Dr. Melrose Nakayama for tremor call if tremor worse  Provider: Dr. Olivia Mackie McLean-Scocuzza-Internal Medicine

## 2021-02-03 ENCOUNTER — Other Ambulatory Visit: Payer: Self-pay | Admitting: Family Medicine

## 2021-02-03 DIAGNOSIS — R7989 Other specified abnormal findings of blood chemistry: Secondary | ICD-10-CM

## 2021-02-03 DIAGNOSIS — E039 Hypothyroidism, unspecified: Secondary | ICD-10-CM

## 2021-02-03 DIAGNOSIS — R945 Abnormal results of liver function studies: Secondary | ICD-10-CM

## 2021-02-03 MED ORDER — LEVOTHYROXINE SODIUM 100 MCG PO TABS: 100.0000 ug | ORAL_TABLET | Freq: Every day | ORAL | 1 refills | Status: DC

## 2021-02-06 DIAGNOSIS — N6489 Other specified disorders of breast: Secondary | ICD-10-CM | POA: Diagnosis not present

## 2021-02-07 ENCOUNTER — Ambulatory Visit: Payer: Self-pay | Admitting: General Surgery

## 2021-02-07 ENCOUNTER — Other Ambulatory Visit: Payer: Self-pay | Admitting: General Surgery

## 2021-02-07 DIAGNOSIS — N6489 Other specified disorders of breast: Secondary | ICD-10-CM

## 2021-02-07 NOTE — H&P (Signed)
PATIENT PROFILE: Tracy Stephens is a 50 y.o. female who presents to the Clinic for consultation at the request of Dr. Judie Grieve for evaluation of complex sclerosing lesion.   PCP:  McLean-Scocuzza, Pervis Hocking, MD   HISTORY OF PRESENT ILLNESS: Tracy Stephens reports she had her usual screening mammogram. Denies any palpable mass, skin changes, nipple discharge or nipple retraction. Denies breast pain.    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         GENERAL REVIEW OF SYSTEMS:    General ROS: negative for - chills, fatigue, fever, weight gain or weight loss Allergy and Immunology ROS: negative for - hives  Hematological and Lymphatic ROS: negative for - bleeding problems or bruising, negative for palpable nodes Endocrine ROS: negative for - heat or cold intolerance, hair changes Respiratory ROS: negative for - cough, shortness of breath or wheezing Cardiovascular ROS: no chest pain or palpitations GI ROS: negative for nausea, vomiting, abdominal pain, diarrhea, constipation Musculoskeletal ROS: negative for - joint swelling or muscle pain Neurological ROS: negative for - confusion, syncope Dermatological ROS: negative for pruritus and rash Psychiatric: negative for anxiety, depression, difficulty sleeping and memory loss   MEDICATIONS: Current Medications        Current Outpatient Medications  Medication Sig Dispense Refill   atorvastatin (LIPITOR) 40 MG tablet Take 40 mg by mouth at bedtime       cetirizine (ZYRTEC) 10 MG tablet once daily        cholecalciferol (VITAMIN D3) 1000 unit tablet Take 1 tablet by mouth 2 (two) times daily.       diclofenac (VOLTAREN) 1 % topical gel Apply topically       gabapentin (NEURONTIN) 300 MG capsule Take 300 mg by mouth at bedtime       halobetasol (ULTRAVATE) 0.05 % cream as needed       levothyroxine (SYNTHROID, LEVOTHROID) 125 MCG tablet Take 1 tablet (125 mcg total) by mouth once daily. Take on an empty stomach with a glass of water at least 30-60 minutes before breakfast. (Patient taking differently: Take 100 mcg by mouth 3 (three) times a week Take on an empty stomach with a glass of water at least 30-60 minutes before breakfast.) 30 tablet 3   montelukast (SINGULAIR) 10 mg tablet once daily       propranoloL (INDERAL) 40 MG tablet TAKE 1 TABLET BY MOUTH TWICE A DAY (Patient taking differently: Take 40 mg by mouth 2 (two) times daily) 180 tablet 0   SITagliptin-metformin (JANUMET XR) 50-1,000 mg ER 24 hr mutliphase tablet Take 1 tablet by mouth once daily       traZODone (DESYREL) 150 MG tablet Take 150 mg by mouth once daily       gabapentin (NEURONTIN) 100 MG capsule Take 1 capsule (100 mg total) by mouth nightly (Patient not taking: Reported on 02/06/2021) 30 capsule 1   metFORMIN (GLUCOPHAGE-XR) 500 MG XR tablet Take 1,000 mg by mouth 2 (two)  times daily after meals (Patient not taking: Reported on 02/06/2021)       peg-electrolyte (NULYTELY) solution Take 4,000 mLs by mouth as directed Take as directed for colon prep. (Patient not taking: Reported on 02/06/2021) 4000 mL 0   traZODone (DESYREL) 100 MG tablet  (Patient not taking: Reported on 02/06/2021)       VITAMIN D2 1,250 mcg (50,000 unit) capsule take 1 capsule by mouth EVERY MONTH (Patient not taking: Reported on 02/06/2021)   0    No current facility-administered medications for this visit.        ALLERGIES: Pregabalin, Duloxetine hcl, and Influenza virus vaccines   PAST MEDICAL HISTORY:     Past Medical History:  Diagnosis Date    Basic learning disability, reading     Benign essential tremor     Fibromyalgia     Hyperlipidemia     Prediabetes     Psoriasis (a type of skin inflammation)     Thyroid disease        PAST SURGICAL HISTORY:      Past Surgical History:  Procedure Laterality Date   CESAREAN SECTION        scar tissue removed after 1st c-section. Two children   TUBAL LIGATION          FAMILY HISTORY:      Family History  Problem Relation Age of Onset   Lung cancer Mother     Alzheimer's disease Father     Parkinsonism Father     Thyroid disease Sister     Thyroid disease Sister     Thyroid disease Sister     Thyroid disease Sister        SOCIAL HISTORY: Social History           Socioeconomic History   Marital status: Single  Tobacco Use   Smoking status: Never Smoker   Smokeless tobacco: Never Used  Building services engineer Use: Never used  Substance and Sexual Activity   Alcohol use: No      Alcohol/week: 0.0 standard drinks   Drug use: No   Sexual activity: Not Currently      Birth control/protection: Surgical        PHYSICAL EXAM:    Vitals:    02/06/21 0910  BP: 123/84  Pulse: 75    Body mass index is 30.23 kg/m. Weight: 72.6 kg (160 lb)    GENERAL: Alert, active, oriented x3   HEENT: Pupils equal reactive to light. Extraocular movements are intact. Sclera clear. Palpebral conjunctiva normal red color.Pharynx clear.   NECK: Supple with no palpable mass and no adenopathy.   LUNGS: Sound clear with no rales rhonchi or wheezes.   HEART: Regular rhythm S1 and S2 without murmur.   BREAST: breasts appear normal, no suspicious masses, no skin or nipple changes or axillary nodes.   ABDOMEN: Soft and depressible, nontender with no palpable mass, no hepatomegaly.   EXTREMITIES: Well-developed well-nourished symmetrical with no dependent edema.   NEUROLOGICAL: Awake alert oriented, facial expression symmetrical, moving all extremities.   REVIEW OF DATA: I have  reviewed the following data today:      Office Visit on 11/07/2020  Component Date Value   Diagnostic Interpretation 11/07/2020 Comment    Specimen adequacy: - Lab* 11/07/2020 Comment    Clinician provided ICD10* 11/07/2020 Comment    PERFORMED BY: - [facility] 11/07/2020 Comment    . - [facility] 11/07/2020 .    Note: - [facility] 11/07/2020 Comment  Test Method MT21 Bonnee Quin* 11/07/2020 Comment       ASSESSMENT: Tracy Stephens is a 50 y.o. female presenting for consultation for left breast complex sclerosing lesion.     Patient was oriented again about the pathology results. Surgical alternatives were discussed with patient including excisional biopsy. Surgical technique and post operative care was discussed with patient. Patient oriented that the purpose of the surgery is diagnostic and not therapeutic. The goal is to rule out malignancy. If positive for malignancy, subsequent surgery and other treatment might be needed. Risk of surgery was discussed with patient including but not limited to: wound infection, seroma, hematoma, brachial plexopathy, mondor's disease (thrombosis of small veins of breast), chronic wound pain, breast lymphedema, altered sensation to the nipple and cosmesis among others.    Complex sclerosing lesion of left breast [N64.89]   PLAN: Left breast radiofrequency guided excisional biopsy (19125) CBC, CMP done Avoid taking aspirin 5 days before surgery Contact us if you have any concern.    Patient verbalized understanding, all questions were answered, and were agreeable with the plan outlined above.        Carolan Shiver, MD

## 2021-02-07 NOTE — H&P (View-Only) (Signed)
PATIENT PROFILE: Tracy Stephens is a 50 y.o. female who presents to the Clinic for consultation at the request of Dr. Judie Grieve for evaluation of complex sclerosing lesion.   PCP:  McLean-Scocuzza, Pervis Hocking, MD   HISTORY OF PRESENT ILLNESS: [name] reports she had her usual screening mammogram. Denies any palpable mass, skin changes, nipple discharge or nipple retraction. Denies breast pain.    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         GENERAL REVIEW OF SYSTEMS:    General ROS: negative for - chills, fatigue, fever, weight gain or weight loss Allergy and Immunology ROS: negative for - hives  Hematological and Lymphatic ROS: negative for - bleeding problems or bruising, negative for palpable nodes Endocrine ROS: negative for - heat or cold intolerance, hair changes Respiratory ROS: negative for - cough, shortness of breath or wheezing Cardiovascular ROS: no chest pain or palpitations GI ROS: negative for nausea, vomiting, abdominal pain, diarrhea, constipation Musculoskeletal ROS: negative for - joint swelling or muscle pain Neurological ROS: negative for - confusion, syncope Dermatological ROS: negative for pruritus and rash Psychiatric: negative for anxiety, depression, difficulty sleeping and memory loss   MEDICATIONS: Current Medications        Current Outpatient Medications  Medication Sig Dispense Refill   atorvastatin (LIPITOR) 40 MG tablet Take 40 mg by mouth at bedtime       cetirizine (ZYRTEC) 10 MG tablet once daily        cholecalciferol (VITAMIN D3) 1000 unit tablet Take 1 tablet by mouth 2 (two) times daily.       diclofenac (VOLTAREN) 1 % topical gel Apply topically       gabapentin (NEURONTIN) 300 MG capsule Take 300 mg by mouth at bedtime       halobetasol (ULTRAVATE) 0.05 % cream as needed       levothyroxine (SYNTHROID, LEVOTHROID) 125 MCG tablet Take 1 tablet (125 mcg total) by mouth once daily. Take on an empty stomach with a glass of water at least 30-60 minutes before breakfast. (Patient taking differently: Take 100 mcg by mouth 3 (three) times a week Take on an empty stomach with a glass of water at least 30-60 minutes before breakfast.) 30 tablet 3   montelukast (SINGULAIR) 10 mg tablet once daily       propranoloL (INDERAL) 40 MG tablet TAKE 1 TABLET BY MOUTH TWICE A DAY (Patient taking differently: Take 40 mg by mouth 2 (two) times daily) 180 tablet 0   SITagliptin-metformin (JANUMET XR) 50-1,000 mg ER 24 hr mutliphase tablet Take 1 tablet by mouth once daily       traZODone (DESYREL) 150 MG tablet Take 150 mg by mouth once daily       gabapentin (NEURONTIN) 100 MG capsule Take 1 capsule (100 mg total) by mouth nightly (Patient not taking: Reported on 02/06/2021) 30 capsule 1   metFORMIN (GLUCOPHAGE-XR) 500 MG XR tablet Take 1,000 mg by mouth 2 (two)  times daily after meals (Patient not taking: Reported on 02/06/2021)       peg-electrolyte (NULYTELY) solution Take 4,000 mLs by mouth as directed Take as directed for colon prep. (Patient not taking: Reported on 02/06/2021) 4000 mL 0   traZODone (DESYREL) 100 MG tablet  (Patient not taking: Reported on 02/06/2021)       VITAMIN D2 1,250 mcg (50,000 unit) capsule take 1 capsule by mouth EVERY MONTH (Patient not taking: Reported on 02/06/2021)   0    No current facility-administered medications for this visit.        ALLERGIES: Pregabalin, Duloxetine hcl, and Influenza virus vaccines   PAST MEDICAL HISTORY:     Past Medical History:  Diagnosis Date    Basic learning disability, reading     Benign essential tremor     Fibromyalgia     Hyperlipidemia     Prediabetes     Psoriasis (a type of skin inflammation)     Thyroid disease        PAST SURGICAL HISTORY:      Past Surgical History:  Procedure Laterality Date   CESAREAN SECTION        scar tissue removed after 1st c-section. Two children   TUBAL LIGATION          FAMILY HISTORY:      Family History  Problem Relation Age of Onset   Lung cancer Mother     Alzheimer's disease Father     Parkinsonism Father     Thyroid disease Sister     Thyroid disease Sister     Thyroid disease Sister     Thyroid disease Sister        SOCIAL HISTORY: Social History           Socioeconomic History   Marital status: Single  Tobacco Use   Smoking status: Never Smoker   Smokeless tobacco: Never Used  Building services engineer Use: Never used  Substance and Sexual Activity   Alcohol use: No      Alcohol/week: 0.0 standard drinks   Drug use: No   Sexual activity: Not Currently      Birth control/protection: Surgical        PHYSICAL EXAM:    Vitals:    02/06/21 0910  BP: 123/84  Pulse: 75    Body mass index is 30.23 kg/m. Weight: 72.6 kg (160 lb)    GENERAL: Alert, active, oriented x3   HEENT: Pupils equal reactive to light. Extraocular movements are intact. Sclera clear. Palpebral conjunctiva normal red color.Pharynx clear.   NECK: Supple with no palpable mass and no adenopathy.   LUNGS: Sound clear with no rales rhonchi or wheezes.   HEART: Regular rhythm S1 and S2 without murmur.   BREAST: breasts appear normal, no suspicious masses, no skin or nipple changes or axillary nodes.   ABDOMEN: Soft and depressible, nontender with no palpable mass, no hepatomegaly.   EXTREMITIES: Well-developed well-nourished symmetrical with no dependent edema.   NEUROLOGICAL: Awake alert oriented, facial expression symmetrical, moving all extremities.   REVIEW OF DATA: I have  reviewed the following data today:      Office Visit on 11/07/2020  Component Date Value   Diagnostic Interpretation 11/07/2020 Comment    Specimen adequacy: - Lab* 11/07/2020 Comment    Clinician provided ICD10* 11/07/2020 Comment    PERFORMED BY: - [facility] 11/07/2020 Comment    . - [facility] 11/07/2020 .    Note: - [facility] 11/07/2020 Comment  Test Method MT21 Bonnee Quin* 11/07/2020 Comment       ASSESSMENT: Ms. Kumari is a 50 y.o. female presenting for consultation for left breast complex sclerosing lesion.     Patient was oriented again about the pathology results. Surgical alternatives were discussed with patient including excisional biopsy. Surgical technique and post operative care was discussed with patient. Patient oriented that the purpose of the surgery is diagnostic and not therapeutic. The goal is to rule out malignancy. If positive for malignancy, subsequent surgery and other treatment might be needed. Risk of surgery was discussed with patient including but not limited to: wound infection, seroma, hematoma, brachial plexopathy, mondor's disease (thrombosis of small veins of breast), chronic wound pain, breast lymphedema, altered sensation to the nipple and cosmesis among others.    Complex sclerosing lesion of left breast [N64.89]   PLAN: Left breast radiofrequency guided excisional biopsy (19125) CBC, CMP done Avoid taking aspirin 5 days before surgery Contact us if you have any concern.    Patient verbalized understanding, all questions were answered, and were agreeable with the plan outlined above.        Carolan Shiver, MD

## 2021-02-09 ENCOUNTER — Other Ambulatory Visit
Admission: RE | Admit: 2021-02-09 | Discharge: 2021-02-09 | Disposition: A | Discharge status: Home / Self Care | Payer: Medicare Other | Source: Ambulatory Visit | Attending: General Surgery | Admitting: General Surgery

## 2021-02-09 HISTORY — DX: Hypothyroidism, unspecified: E03.9

## 2021-02-09 HISTORY — DX: Personal history of urinary calculi: Z87.442

## 2021-02-09 HISTORY — DX: Other complications of anesthesia, initial encounter: T88.59XA

## 2021-02-09 NOTE — Patient Instructions (Addendum)
Your procedure is scheduled on: 02/14/21 - Wednesday Report to the Registration Desk on the 1st floor of the [facility]. To find out your arrival time, please call [phone] between 1PM - 3PM on: 02/13/21 - Tuesday - Report to [facility] on 02/12/21 at 2:30 pm for EKG.  REMEMBER: Instructions that are not followed completely may result in serious medical risk, up to and including death; or upon the discretion of your surgeon and anesthesiologist your surgery may need to be rescheduled.  Do not eat food after midnight the night before surgery.  No gum chewing, lozengers or hard candies.  You may however, drink CLEAR liquids up to 2 hours before you are scheduled to arrive for your surgery. Do not drink anything within 2 hours of your scheduled arrival time. Type 1 and Type 2 diabetics should only drink water.  TAKE THESE MEDICATIONS THE MORNING OF SURGERY WITH A SIP OF WATER:  - propranolol (INDERAL) 20 MG tablet - levothyroxine (SYNTHROID) 100 MCG tablet  Stop SitaGLIPtin-MetFORMIN HCl (JANUMET XR) 50-1000 MG TB24  two days prior to surgery. Do not take 08/22, 08/23 and do not take the day of surgery.  One week prior to surgery: Stop Anti-inflammatories (NSAIDS) such as Advil, Aleve, Ibuprofen, Motrin, Naproxen, Naprosyn and Aspirin based products such as Excedrin, Goodys Powder, BC Powder.  Stop ANY OVER THE COUNTER supplements until after surgery.  You may however, continue to take Tylenol if needed for pain up until the day of surgery.  No Alcohol for 24 hours before or after surgery.  No Smoking including e-cigarettes for 24 hours prior to surgery.  No chewable tobacco products for at least 6 hours prior to surgery.  No nicotine patches on the day of surgery.  Do not use any "recreational" drugs for at least a week prior to your surgery.  Please be advised that the combination of cocaine and anesthesia may have negative outcomes, up to and including death. If you test  positive for cocaine, your surgery will be cancelled.  On the morning of surgery brush your teeth with toothpaste and water, you may rinse your mouth with mouthwash if you wish. Do not swallow any toothpaste or mouthwash.  Do not wear jewelry, make-up, hairpins, clips or nail polish.  Do not wear lotions, powders, or perfumes.   Do not shave body from the neck down 48 hours prior to surgery just in case you cut yourself which could leave a site for infection.  Also, freshly shaved skin may become irritated if using the CHG soap.  Contact lenses, hearing aids and dentures may not be worn into surgery.  Do not bring valuables to the hospital. [facility] is not responsible for any missing/lost belongings or valuables.   Use CHG Soap or wipes as directed on instruction sheet.  Notify your doctor if there is any change in your medical condition (cold, fever, infection).  Wear comfortable clothing (specific to your surgery type) to the hospital.  After surgery, you can help prevent lung complications by doing breathing exercises.  Take deep breaths and cough every 1-2 hours. Your doctor may order a device called an Incentive Spirometer to help you take deep breaths. When coughing or sneezing, hold a pillow firmly against your incision with both hands. This is called "splinting." Doing this helps protect your incision. It also decreases belly discomfort.  If you are being admitted to the hospital overnight, leave your suitcase in the car. After surgery it may be brought to  your room.  If you are being discharged the day of surgery, you will not be allowed to drive home. You will need a responsible adult (18 years or older) to drive you home and stay with you that night.   If you are taking public transportation, you will need to have a responsible adult (18 years or older) with you. Please confirm with your physician that it is acceptable to use public transportation.   Please call the  Pre-admissions Testing Dept. at [phone] if you have any questions about these instructions.  Surgery Visitation Policy:  Patients undergoing a surgery or procedure may have one family member or support person with them as long as that person is not COVID-19 positive or experiencing its symptoms.  That person may remain in the waiting area during the procedure.  Inpatient Visitation:    Visiting hours are 7 a.m. to 8 p.m. Inpatients will be allowed two visitors daily. The visitors may change each day during the patient's stay. No visitors under the age of 14. Any visitor under the age of 41 must be accompanied by an adult. The visitor must pass COVID-19 screenings, use hand sanitizer when entering and exiting the patient's room and wear a mask at all times, including in the patient's room. Patients must also wear a mask when staff or their visitor are in the room. Masking is required regardless of vaccination status.

## 2021-02-12 ENCOUNTER — Ambulatory Visit
Admission: RE | Admit: 2021-02-12 | Discharge: 2021-02-12 | Disposition: A | Discharge status: Home / Self Care | Payer: Medicare Other | Source: Ambulatory Visit | Attending: General Surgery | Admitting: General Surgery

## 2021-02-12 ENCOUNTER — Other Ambulatory Visit: Payer: Self-pay

## 2021-02-12 ENCOUNTER — Other Ambulatory Visit
Admission: RE | Admit: 2021-02-12 | Discharge: 2021-02-12 | Disposition: A | Discharge status: Home / Self Care | Payer: Medicare Other | Source: Ambulatory Visit | Attending: General Surgery | Admitting: General Surgery

## 2021-02-12 DIAGNOSIS — R928 Other abnormal and inconclusive findings on diagnostic imaging of breast: Secondary | ICD-10-CM | POA: Diagnosis not present

## 2021-02-12 DIAGNOSIS — N6489 Other specified disorders of breast: Secondary | ICD-10-CM | POA: Insufficient documentation

## 2021-02-14 ENCOUNTER — Encounter: Payer: Self-pay | Admitting: General Surgery

## 2021-02-14 ENCOUNTER — Ambulatory Visit: Payer: Medicare Other | Admitting: Registered Nurse

## 2021-02-14 ENCOUNTER — Ambulatory Visit
Admission: RE | Admit: 2021-02-14 | Discharge: 2021-02-14 | Disposition: A | Discharge status: Home / Self Care | Payer: Medicare Other | Attending: General Surgery | Admitting: General Surgery

## 2021-02-14 ENCOUNTER — Other Ambulatory Visit: Payer: Self-pay

## 2021-02-14 ENCOUNTER — Ambulatory Visit
Admission: RE | Admit: 2021-02-14 | Discharge: 2021-02-14 | Disposition: A | Discharge status: Home / Self Care | Payer: Medicare Other | Source: Ambulatory Visit | Attending: General Surgery | Admitting: General Surgery

## 2021-02-14 ENCOUNTER — Encounter
Admission: RE | Disposition: A | Discharge status: Home / Self Care | Payer: Self-pay | Source: Home / Self Care | Attending: General Surgery

## 2021-02-14 DIAGNOSIS — Z79890 Hormone replacement therapy (postmenopausal): Secondary | ICD-10-CM | POA: Diagnosis not present

## 2021-02-14 DIAGNOSIS — Z7984 Long term (current) use of oral hypoglycemic drugs: Secondary | ICD-10-CM | POA: Diagnosis not present

## 2021-02-14 DIAGNOSIS — Z801 Family history of malignant neoplasm of trachea, bronchus and lung: Secondary | ICD-10-CM | POA: Diagnosis not present

## 2021-02-14 DIAGNOSIS — Z887 Allergy status to serum and vaccine status: Secondary | ICD-10-CM | POA: Insufficient documentation

## 2021-02-14 DIAGNOSIS — R928 Other abnormal and inconclusive findings on diagnostic imaging of breast: Secondary | ICD-10-CM | POA: Diagnosis not present

## 2021-02-14 DIAGNOSIS — N6489 Other specified disorders of breast: Secondary | ICD-10-CM | POA: Insufficient documentation

## 2021-02-14 DIAGNOSIS — Z888 Allergy status to other drugs, medicaments and biological substances status: Secondary | ICD-10-CM | POA: Insufficient documentation

## 2021-02-14 DIAGNOSIS — N6032 Fibrosclerosis of left breast: Secondary | ICD-10-CM | POA: Diagnosis not present

## 2021-02-14 DIAGNOSIS — E039 Hypothyroidism, unspecified: Secondary | ICD-10-CM | POA: Diagnosis not present

## 2021-02-14 DIAGNOSIS — Z79899 Other long term (current) drug therapy: Secondary | ICD-10-CM | POA: Insufficient documentation

## 2021-02-14 DIAGNOSIS — Z803 Family history of malignant neoplasm of breast: Secondary | ICD-10-CM | POA: Diagnosis not present

## 2021-02-14 HISTORY — PX: BREAST BIOPSY WITH RADIO FREQUENCY LOCALIZER: SHX6895

## 2021-02-14 LAB — GLUCOSE, CAPILLARY
Glucose-Capillary: 174 mg/dL — ABNORMAL HIGH (ref 70–99)
Glucose-Capillary: 195 mg/dL — ABNORMAL HIGH (ref 70–99)

## 2021-02-14 SURGERY — BREAST BIOPSY WITH RADIO FREQUENCY LOCALIZER
Anesthesia: General | Site: Breast | Laterality: Left

## 2021-02-14 MED ORDER — FENTANYL CITRATE (PF) 100 MCG/2ML IJ SOLN
INTRAMUSCULAR | Status: DC | PRN
  Administered 2021-02-14: 50 ug via INTRAVENOUS

## 2021-02-14 MED ORDER — FAMOTIDINE 20 MG PO TABS
ORAL_TABLET | ORAL | Status: AC
  Administered 2021-02-14: 20 mg via ORAL
  Filled 2021-02-14: qty 1

## 2021-02-14 MED ORDER — ONDANSETRON HCL 4 MG/2ML IJ SOLN: 4.0000 mg | Freq: Once | INTRAMUSCULAR | Status: DC | PRN

## 2021-02-14 MED ORDER — PROPOFOL 500 MG/50ML IV EMUL
INTRAVENOUS | Status: AC
  Filled 2021-02-14: qty 50

## 2021-02-14 MED ORDER — CHLORHEXIDINE GLUCONATE 0.12 % MT SOLN
OROMUCOSAL | Status: AC
  Administered 2021-02-14: 15 mL via OROMUCOSAL
  Filled 2021-02-14: qty 15

## 2021-02-14 MED ORDER — ORAL CARE MOUTH RINSE: 15.0000 mL | Freq: Once | OROMUCOSAL | Status: AC

## 2021-02-14 MED ORDER — STERILE WATER FOR IRRIGATION IR SOLN
Status: DC | PRN
  Administered 2021-02-14: 1000 mL

## 2021-02-14 MED ORDER — CHLORHEXIDINE GLUCONATE 0.12 % MT SOLN: 15.0000 mL | Freq: Once | OROMUCOSAL | Status: AC

## 2021-02-14 MED ORDER — FAMOTIDINE 20 MG PO TABS: 20.0000 mg | ORAL_TABLET | Freq: Once | ORAL | Status: AC

## 2021-02-14 MED ORDER — PROPOFOL 10 MG/ML IV BOLUS
INTRAVENOUS | Status: DC | PRN
  Administered 2021-02-14: 200 mg via INTRAVENOUS

## 2021-02-14 MED ORDER — FENTANYL CITRATE (PF) 100 MCG/2ML IJ SOLN: 25.0000 ug | INTRAMUSCULAR | Status: DC | PRN

## 2021-02-14 MED ORDER — LIDOCAINE HCL (CARDIAC) PF 100 MG/5ML IV SOSY
PREFILLED_SYRINGE | INTRAVENOUS | Status: DC | PRN
  Administered 2021-02-14: 100 mg via INTRAVENOUS

## 2021-02-14 MED ORDER — FENTANYL CITRATE (PF) 100 MCG/2ML IJ SOLN
INTRAMUSCULAR | Status: AC
  Filled 2021-02-14: qty 2

## 2021-02-14 MED ORDER — BUPIVACAINE-EPINEPHRINE (PF) 0.5% -1:200000 IJ SOLN
INTRAMUSCULAR | Status: DC | PRN
  Administered 2021-02-14: 30 mL

## 2021-02-14 MED ORDER — CEFAZOLIN SODIUM-DEXTROSE 2-4 GM/100ML-% IV SOLN
2.0000 g | INTRAVENOUS | Status: AC
  Administered 2021-02-14: 2 g via INTRAVENOUS

## 2021-02-14 MED ORDER — SODIUM CHLORIDE 0.9 % IV SOLN: INTRAVENOUS | Status: DC

## 2021-02-14 MED ORDER — HYDROCODONE-ACETAMINOPHEN 5-325 MG PO TABS: 1.0000 | ORAL_TABLET | ORAL | 0 refills | Status: AC | PRN

## 2021-02-14 MED ORDER — DEXAMETHASONE SODIUM PHOSPHATE 10 MG/ML IJ SOLN
INTRAMUSCULAR | Status: AC
  Filled 2021-02-14: qty 1

## 2021-02-14 MED ORDER — EPHEDRINE SULFATE 50 MG/ML IJ SOLN
INTRAMUSCULAR | Status: DC | PRN
  Administered 2021-02-14: 10 mg via INTRAVENOUS
  Administered 2021-02-14: 5 mg via INTRAVENOUS

## 2021-02-14 MED ORDER — MEPERIDINE HCL 25 MG/ML IJ SOLN: 6.2500 mg | INTRAMUSCULAR | Status: DC | PRN

## 2021-02-14 MED ORDER — LIDOCAINE HCL (PF) 2 % IJ SOLN
INTRAMUSCULAR | Status: AC
  Filled 2021-02-14: qty 5

## 2021-02-14 MED ORDER — DEXAMETHASONE SODIUM PHOSPHATE 10 MG/ML IJ SOLN
INTRAMUSCULAR | Status: DC | PRN
  Administered 2021-02-14: 8 mg via INTRAVENOUS

## 2021-02-14 MED ORDER — BUPIVACAINE-EPINEPHRINE (PF) 0.5% -1:200000 IJ SOLN
INTRAMUSCULAR | Status: AC
  Filled 2021-02-14: qty 30

## 2021-02-14 MED ORDER — MIDAZOLAM HCL 2 MG/2ML IJ SOLN
INTRAMUSCULAR | Status: AC
  Filled 2021-02-14: qty 2

## 2021-02-14 MED ORDER — ONDANSETRON HCL 4 MG/2ML IJ SOLN
INTRAMUSCULAR | Status: DC | PRN
  Administered 2021-02-14: 4 mg via INTRAVENOUS

## 2021-02-14 MED ORDER — MIDAZOLAM HCL 2 MG/2ML IJ SOLN
INTRAMUSCULAR | Status: DC | PRN
  Administered 2021-02-14: 2 mg via INTRAVENOUS

## 2021-02-14 MED ORDER — EPHEDRINE 5 MG/ML INJ
INTRAVENOUS | Status: AC
  Filled 2021-02-14: qty 5

## 2021-02-14 MED ORDER — CEFAZOLIN SODIUM-DEXTROSE 2-4 GM/100ML-% IV SOLN
INTRAVENOUS | Status: AC
  Filled 2021-02-14: qty 100

## 2021-02-14 MED ORDER — PROPOFOL 10 MG/ML IV BOLUS
INTRAVENOUS | Status: AC
  Filled 2021-02-14: qty 20

## 2021-02-14 MED ORDER — ONDANSETRON HCL 4 MG/2ML IJ SOLN
INTRAMUSCULAR | Status: AC
  Filled 2021-02-14: qty 2

## 2021-02-14 SURGICAL SUPPLY — 44 items
ADH SKN CLS APL DERMABOND .7 (GAUZE/BANDAGES/DRESSINGS) ×1
APL PRP STRL LF DISP 70% ISPRP (MISCELLANEOUS) ×1
BLADE SURG 15 STRL LF DISP TIS (BLADE) ×1 IMPLANT
BLADE SURG 15 STRL SS (BLADE) ×2
CANISTER SUCT 1200ML W/VALVE (MISCELLANEOUS) ×2 IMPLANT
CHLORAPREP W/TINT 26 (MISCELLANEOUS) ×2 IMPLANT
CNTNR SPEC 2.5X3XGRAD LEK (MISCELLANEOUS) ×1
CONT SPEC 4OZ STER OR WHT (MISCELLANEOUS) ×1
CONT SPEC 4OZ STRL OR WHT (MISCELLANEOUS) ×1
CONTAINER SPEC 2.5X3XGRAD LEK (MISCELLANEOUS) ×1 IMPLANT
DERMABOND ADVANCED (GAUZE/BANDAGES/DRESSINGS) ×1
DERMABOND ADVANCED .7 DNX12 (GAUZE/BANDAGES/DRESSINGS) ×1 IMPLANT
DEVICE DUBIN SPECIMEN MAMMOGRA (MISCELLANEOUS) ×2 IMPLANT
DRAPE LAPAROTOMY TRNSV 106X77 (MISCELLANEOUS) ×2 IMPLANT
ELECT CAUTERY BLADE TIP 2.5 (TIP) ×2
ELECT REM PT RETURN 9FT ADLT (ELECTROSURGICAL) ×2
ELECTRODE CAUTERY BLDE TIP 2.5 (TIP) ×1 IMPLANT
ELECTRODE REM PT RTRN 9FT ADLT (ELECTROSURGICAL) ×1 IMPLANT
GAUZE 4X4 16PLY ~~LOC~~+RFID DBL (SPONGE) ×2 IMPLANT
GLOVE SURG ENC MOIS LTX SZ6.5 (GLOVE) ×2 IMPLANT
GLOVE SURG UNDER POLY LF SZ6.5 (GLOVE) ×2 IMPLANT
GOWN STRL REUS W/ TWL LRG LVL3 (GOWN DISPOSABLE) ×3 IMPLANT
GOWN STRL REUS W/TWL LRG LVL3 (GOWN DISPOSABLE) ×6
KIT MARKER MARGIN INK (KITS) ×2 IMPLANT
KIT TURNOVER KIT A (KITS) ×2 IMPLANT
LABEL OR SOLS (LABEL) ×2 IMPLANT
MANIFOLD NEPTUNE II (INSTRUMENTS) ×2 IMPLANT
MARKER MARGIN CORRECT CLIP (MARKER) ×2 IMPLANT
NEEDLE HYPO 25X1 1.5 SAFETY (NEEDLE) ×2 IMPLANT
PACK BASIN MINOR ARMC (MISCELLANEOUS) ×2 IMPLANT
RETRACTOR RING XSMALL (MISCELLANEOUS) ×1 IMPLANT
RTRCTR WOUND ALEXIS 13CM XS SH (MISCELLANEOUS) ×2
SET LOCALIZER 20 PROBE US (MISCELLANEOUS) ×2 IMPLANT
SUT MNCRL 4-0 (SUTURE) ×2
SUT MNCRL 4-0 27XMFL (SUTURE) ×1
SUT SILK 2 0 SH (SUTURE) ×2 IMPLANT
SUT VIC AB 3-0 SH 27 (SUTURE) ×2
SUT VIC AB 3-0 SH 27X BRD (SUTURE) ×1 IMPLANT
SUTURE MNCRL 4-0 27XMF (SUTURE) ×1 IMPLANT
SYR 10ML LL (SYRINGE) ×2 IMPLANT
SYR BULB IRRIG 60ML STRL (SYRINGE) ×2 IMPLANT
TRAP NEPTUNE SPECIMEN COLLECT (MISCELLANEOUS) ×2 IMPLANT
WATER STERILE IRR 1000ML POUR (IV SOLUTION) ×2 IMPLANT
WATER STERILE IRR 500ML POUR (IV SOLUTION) ×2 IMPLANT

## 2021-02-14 NOTE — Anesthesia Postprocedure Evaluation (Signed)
Anesthesia Post Note  Patient: Engineer, production  Procedure(s) Performed: LEFT BREAST EXCISIONAL BIOPSY WITH RADIO FREQUENCY LOCALIZER (Left: Breast)  Patient location during evaluation: PACU Anesthesia Type: General Level of consciousness: awake and alert, awake and oriented Pain management: pain level controlled Vital Signs Assessment: post-procedure vital signs reviewed and stable Respiratory status: spontaneous breathing, nonlabored ventilation and respiratory function stable Cardiovascular status: blood pressure returned to baseline and stable Postop Assessment: no apparent nausea or vomiting Anesthetic complications: no   No notable events documented.   Last Vitals:  Vitals:   02/14/21 0915 02/14/21 0929  BP: 122/63 117/76  Pulse: 63 (!) 58  Resp: 12 15  Temp: (!) 36.1 C (!) 36.2 C  SpO2: 96% 97%    Last Pain:  Vitals:   02/14/21 0929  TempSrc: Temporal  PainSc: 0-No pain                 Manfred Arch

## 2021-02-14 NOTE — Anesthesia Preprocedure Evaluation (Signed)
Anesthesia Evaluation  Patient identified by MRN, date of birth, ID band Patient awake    Reviewed: Allergy & Precautions, NPO status , Patient's Chart, lab work & pertinent test results, reviewed documented beta blocker date and time   History of Anesthesia Complications (+) history of anesthetic complications  Airway Mallampati: II  TM Distance: >3 FB Neck ROM: Full    Dental no notable dental hx.    Pulmonary neg pulmonary ROS,    Pulmonary exam normal        Cardiovascular hypertension, Pt. on medications and Pt. on home beta blockers negative cardio ROS Normal cardiovascular exam     Neuro/Psych PSYCHIATRIC DISORDERS Anxiety Depression  Neuromuscular disease    GI/Hepatic negative GI ROS, Neg liver ROS,   Endo/Other  diabetes, Oral Hypoglycemic AgentsHypothyroidism   Renal/GU negative Renal ROS  negative genitourinary   Musculoskeletal  (+) Fibromyalgia -  Abdominal   Peds negative pediatric ROS (+)  Hematology negative hematology ROS (+)   Anesthesia Other Findings Allergy    Anxiety    Benign essential tremor  right side  Complication of anesthesia  complications with epidural in 2002  Depression    Fibromyalgia    Herniated lumbar intervertebral disc  History of kidney stones   Hyperlipidemia    Hypertension  Hypothyroidism    Ovarian cyst 08/21/2016 One septated cyst, one simple cyst both on left ovary Feb 2018  Psoriasis    Thyroid disease    Type 2 diabetes mellitus (HCC) 11/29/2015   Vitamin D deficiency       Reproductive/Obstetrics negative OB ROS                            Anesthesia Physical Anesthesia Plan  ASA: 3  Anesthesia Plan: General   Post-op Pain Management:    Induction: Intravenous  PONV Risk Score and Plan: 2 and Ondansetron, Propofol infusion and Midazolam  Airway Management Planned: LMA  Additional Equipment:   Intra-op Plan:    Post-operative Plan: Extubation in OR  Informed Consent: I have reviewed the patients History and Physical, chart, labs and discussed the procedure including the risks, benefits and alternatives for the proposed anesthesia with the patient or authorized representative who has indicated his/her understanding and acceptance.       Plan Discussed with: CRNA, Anesthesiologist and Surgeon  Anesthesia Plan Comments:         Anesthesia Quick Evaluation

## 2021-02-14 NOTE — Anesthesia Procedure Notes (Signed)
Procedure Name: LMA Insertion Date/Time: 02/14/2021 8:28 AM Performed by: Karoline Caldwell, CRNA Pre-anesthesia Checklist: Patient identified, Patient being monitored, Timeout performed, Emergency Drugs available and Suction available Patient Re-evaluated:Patient Re-evaluated prior to induction Oxygen Delivery Method: Circle system utilized Preoxygenation: Pre-oxygenation with 100% oxygen Induction Type: IV induction Ventilation: Mask ventilation without difficulty LMA: LMA inserted LMA Size: 3.5 Tube type: Oral Number of attempts: 1 Placement Confirmation: positive ETCO2 and breath sounds checked- equal and bilateral Tube secured with: Tape Dental Injury: Teeth and Oropharynx as per pre-operative assessment

## 2021-02-14 NOTE — Discharge Instructions (Addendum)
  Diet: Resume home heart healthy regular diet.   Activity: Increase activity as tolerated. Light activity and walking are encouraged. Do not drive or drink alcohol if taking narcotic pain medications.  Wound care: May shower with soapy water and pat dry (do not rub incisions), but no baths or submerging incision underwater until follow-up. (no swimming)   Medications: Resume all home medications. For mild to moderate pain: acetaminophen (Tylenol) or ibuprofen (if no kidney disease). Combining Tylenol with alcohol can substantially increase your risk of causing liver disease. Narcotic pain medications, if prescribed, can be used for severe pain, though may cause nausea, constipation, and drowsiness. Do not combine Tylenol and Norco within a 6 hour period as Norco contains Tylenol. If you do not need the narcotic pain medication, you do not need to fill the prescription.  Call office ([phone]) at any time if any questions, worsening pain, fevers/chills, bleeding, drainage from incision site, or other concerns.   AMBULATORY SURGERY  DISCHARGE INSTRUCTIONS   The drugs that you were given will stay in your system until tomorrow so for the next 24 hours you should not:  Drive an automobile Make any legal decisions Drink any alcoholic beverage   You may resume regular meals tomorrow.  Today it is better to start with liquids and gradually work up to solid foods.  You may eat anything you prefer, but it is better to start with liquids, then soup and crackers, and gradually work up to solid foods.   Please notify your doctor immediately if you have any unusual bleeding, trouble breathing, redness and pain at the surgery site, drainage, fever, or pain not relieved by medication.    Additional Instructions:        Please contact your physician with any problems or Same Day Surgery at [phone], Monday through Friday 6 am to 4 pm, or [facility] Main number at  [phone]. 

## 2021-02-14 NOTE — Transfer of Care (Signed)
Immediate Anesthesia Transfer of Care Note  Patient: Tracy Stephens  Procedure(s) Performed: LEFT BREAST EXCISIONAL BIOPSY WITH RADIO FREQUENCY LOCALIZER (Left: Breast)  Patient Location: PACU  Anesthesia Type:General  Level of Consciousness: awake, alert  and oriented  Airway & Oxygen Therapy: Patient Spontanous Breathing  Post-op Assessment: Report given to RN and Post -op Vital signs reviewed and stable  Post vital signs: Reviewed and stable  Last Vitals:  Vitals Value Taken Time  BP 94/60 02/14/21 0834  Temp 36.1 C 02/14/21 0833  Pulse 63 02/14/21 0834  Resp 14 02/14/21 0834  SpO2 97 % 02/14/21 0834  Vitals shown include unvalidated device data.  Last Pain:  Vitals:   02/14/21 0622  TempSrc: Temporal  PainSc: 0-No pain         Complications: No notable events documented.

## 2021-02-14 NOTE — Op Note (Signed)
Preoperative diagnosis: Left breast complex sclerosing lesion.  Postoperative diagnosis: Left breast complex sclerosing lesion.   Procedure: Left breast radiofrequency tag-localized excisional Biopsy.                Anesthesia: GETA  Surgeon: Dr. Hazle Quant  Wound Classification: Clean  Indications: Patient is a 50 y.o. female with a nonpalpable left breast mass noted on mammography with core biopsy demonstrating a complex sclerosing lesion requires radiofrequency tag-localized excisional biopsy to rule out malignancy.   Findings: 1. Specimen mammography shows marker and tag on specimen   Description of procedure: Preoperative radiofrequency tag localization was performed by radiology. Localization studies were reviewed. The patient was taken to the operating room and placed supine on the operating table, and after general anesthesia the left chest and axilla were prepped and draped in the usual sterile fashion. A time-out was completed verifying correct patient, procedure, site, positioning, and implant(s) and/or special equipment prior to beginning this procedure.  By comparing the localization studies and interrogation with Localizer device, the probable trajectory and location of the mass was visualized. A circumareolar skin incision was planned in such a way as to minimize the amount of dissection to reach the mass.  The skin incision was made. Flaps were raised and the location of the tag was confirmed with Localizer device confirmed. A 2-0 silk figure-of-eight stay suture was placed and used for retraction. Dissection was then taken down circumferentially, taking care to include the entire localizing tag and a wide margin of grossly normal tissue. The specimen and entire localizing tag were removed. The specimen was oriented and sent to radiology with the localization studies. Confirmation was received that the entire target lesion had been resected. The wound was irrigated. Hemostasis  was checked. The wound was closed with interrupted sutures of 3-0 Vicryl and a subcuticular suture of Monocryl 3-0. No attempt was made to close the dead space.    Specimen: Left Breast excisional biopsy                 Complications: None  Estimated Blood Loss: 5 mL

## 2021-02-14 NOTE — Interval H&P Note (Signed)
History and Physical Interval Note:  02/14/2021 6:50 AM  [name]  has presented today for surgery, with the diagnosis of N64.89 complex sclerosing lesion of lt breast.  The various methods of treatment have been discussed with the patient and family. After consideration of risks, benefits and other options for treatment, the patient has consented to  Procedure(s): BREAST BIOPSY WITH RADIO FREQUENCY LOCALIZER (Left) as a surgical intervention.  The patient's history has been reviewed, patient examined, no change in status, stable for surgery.  I have reviewed the patient's chart and labs.  Questions were answered to the patient's satisfaction.     Carolan Shiver

## 2021-02-16 ENCOUNTER — Ambulatory Visit: Payer: Medicare Other

## 2021-02-16 LAB — SURGICAL PATHOLOGY

## 2021-02-23 ENCOUNTER — Ambulatory Visit: Admit: 2021-02-23 | Payer: Medicare Other

## 2021-02-23 SURGERY — COLONOSCOPY WITH PROPOFOL
Anesthesia: General

## 2021-02-27 ENCOUNTER — Other Ambulatory Visit: Payer: Self-pay

## 2021-02-27 ENCOUNTER — Ambulatory Visit
Admission: RE | Admit: 2021-02-27 | Discharge: 2021-02-27 | Disposition: A | Discharge status: Home / Self Care | Payer: Medicare Other | Source: Ambulatory Visit | Attending: Family Medicine | Admitting: Family Medicine

## 2021-02-27 DIAGNOSIS — K760 Fatty (change of) liver, not elsewhere classified: Secondary | ICD-10-CM | POA: Diagnosis not present

## 2021-02-27 DIAGNOSIS — R945 Abnormal results of liver function studies: Secondary | ICD-10-CM | POA: Diagnosis not present

## 2021-02-27 DIAGNOSIS — R7989 Other specified abnormal findings of blood chemistry: Secondary | ICD-10-CM

## 2021-03-01 ENCOUNTER — Ambulatory Visit: Payer: Medicare Other

## 2021-04-25 ENCOUNTER — Encounter: Payer: Self-pay | Admitting: Internal Medicine

## 2021-04-25 ENCOUNTER — Other Ambulatory Visit: Payer: Self-pay

## 2021-04-25 ENCOUNTER — Ambulatory Visit (INDEPENDENT_AMBULATORY_CARE_PROVIDER_SITE_OTHER): Payer: Medicare Other | Admitting: Internal Medicine

## 2021-04-25 VITALS — BP 138/84 | HR 79 | Temp 96.0°F | Ht 62.01 in | Wt 156.0 lb

## 2021-04-25 DIAGNOSIS — Z1231 Encounter for screening mammogram for malignant neoplasm of breast: Secondary | ICD-10-CM

## 2021-04-25 DIAGNOSIS — R7989 Other specified abnormal findings of blood chemistry: Secondary | ICD-10-CM | POA: Diagnosis not present

## 2021-04-25 DIAGNOSIS — K760 Fatty (change of) liver, not elsewhere classified: Secondary | ICD-10-CM | POA: Insufficient documentation

## 2021-04-25 DIAGNOSIS — Z0000 Encounter for general adult medical examination without abnormal findings: Secondary | ICD-10-CM

## 2021-04-25 DIAGNOSIS — E039 Hypothyroidism, unspecified: Secondary | ICD-10-CM | POA: Diagnosis not present

## 2021-04-25 DIAGNOSIS — R928 Other abnormal and inconclusive findings on diagnostic imaging of breast: Secondary | ICD-10-CM

## 2021-04-25 DIAGNOSIS — Z9889 Other specified postprocedural states: Secondary | ICD-10-CM | POA: Diagnosis not present

## 2021-04-25 HISTORY — DX: Encounter for general adult medical examination without abnormal findings: Z00.00

## 2021-04-25 LAB — HEPATIC FUNCTION PANEL
ALT: 39 U/L — ABNORMAL HIGH (ref 0–35)
AST: 25 U/L (ref 0–37)
Albumin: 4.4 g/dL (ref 3.5–5.2)
Alkaline Phosphatase: 84 U/L (ref 39–117)
Bilirubin, Direct: 0.1 mg/dL (ref 0.0–0.3)
Total Bilirubin: 0.5 mg/dL (ref 0.2–1.2)
Total Protein: 6.9 g/dL (ref 6.0–8.3)

## 2021-04-25 LAB — TSH: TSH: 0.03 u[IU]/mL — ABNORMAL LOW (ref 0.35–5.50)

## 2021-04-25 NOTE — Patient Instructions (Addendum)
Consider hepatitis A/B vaccine  Zevia soda consider  Simply lemonade light  Gastroenterology Diagnoses   Colon cancer screening    Locklear, Janit Bern, MD   [address]   [place], [place] [address]   Phone: [phone]   Fax: [phone]   [place]ia [place], Janit Bern, MD   [address]   [place], [place] [address]   Phone: [phone]   Fax: [phone]     Cholesterol normal    Ref. Range 01/31/2021 09:21  Cholesterol Latest Ref Range: 0 - 200 mg/dL 379  HDL Cholesterol Latest Ref Range: >39.00 mg/dL 02.40  LDL (calc) Latest Ref Range: 0 - 99 mg/dL 68  NonHDL Unknown 97.35  Triglycerides Latest Ref Range: 0.0 - 149.0 mg/dL 329.9  VLDL Latest Ref Range: 0.0 - 40.0 mg/dL 24.2     Ref. Range 03/15/2019 10:18 01/14/2020 09:11 08/29/2020 11:49 09/13/2020 10:07 01/31/2021 09:21  AST Latest Ref Range: 0 - 37 U/L 19 28 29  25   ALT Latest Ref Range: 0 - 35 U/L 23 40 (H) 32  36 (H)   Fatty Liver Disease The liver converts food into energy, removes toxic material from the blood, makes important proteins, and absorbs necessary vitamins from food. Fatty liver disease occurs when too much fat has built up in your liver cells. Fatty liver disease is also called hepatic steatosis. In many cases, fatty liver disease does not cause symptoms or problems. It is often diagnosed when tests are being done for other reasons. However, over time, fatty liver can cause inflammation that may lead to more serious liver problems, such as scarring of the liver (cirrhosis) and liver failure. Fatty liver is associated with insulin resistance, increased body fat, high blood pressure (hypertension), and high cholesterol. These are features of metabolic syndrome and increase your risk for stroke, diabetes, and heart disease. What are the causes? This condition may be caused by components of metabolic syndrome: Obesity. Insulin resistance. High cholesterol. Other causes: Alcohol abuse. Poor  nutrition. Cushing syndrome. Pregnancy. Certain drugs. Poisons. Some viral infections. What increases the risk? You are more likely to develop this condition if you: Abuse alcohol. Are overweight. Have diabetes. Have hepatitis. Have a high triglyceride level. Are pregnant. What are the signs or symptoms? Fatty liver disease often does not cause symptoms. If symptoms do develop, they can include: Fatigue and weakness. Weight loss. Confusion. Nausea, vomiting, or abdominal pain. Yellowing of your skin and the white parts of your eyes (jaundice). Itchy skin. How is this diagnosed? This condition may be diagnosed by: A physical exam and your medical history. Blood tests. Imaging tests, such as an ultrasound, CT scan, or MRI. A liver biopsy. A small sample of liver tissue is removed using a needle. The sample is then looked at under a microscope. How is this treated? Fatty liver disease is often caused by other health conditions. Treatment for fatty liver may involve medicines and lifestyle changes to manage conditions such as: Alcoholism. High cholesterol. Diabetes. Being overweight or obese. Follow these instructions at home:  Do not drink alcohol. If you have trouble quitting, ask your health care provider how to safely quit with the help of medicine or a supervised program. This is important to keep your condition from getting worse. Eat a healthy diet as told by your health care provider. Ask your health care provider about working with a dietitian to develop an eating plan. Exercise regularly. This can help you lose weight and control your cholesterol and diabetes. Talk to  your health care provider about an exercise plan and which activities are best for you. Take over-the-counter and prescription medicines only as told by your health care provider. Keep all follow-up visits. This is important. Contact a health care provider if: You have trouble controlling your: Blood  sugar. This is especially important if you have diabetes. Cholesterol. Drinking of alcohol. Get help right away if: You have abdominal pain. You have jaundice. You have nausea and are vomiting. You vomit blood or material that looks like coffee grounds. You have stools that are black, tar-like, or bloody. Summary Fatty liver disease develops when too much fat builds up in the cells of your liver. Fatty liver disease often causes no symptoms or problems. However, over time, fatty liver can cause inflammation that may lead to more serious liver problems, such as scarring of the liver (cirrhosis). You are more likely to develop this condition if you abuse alcohol, are pregnant, are overweight, have diabetes, have hepatitis, or have high triglyceride or cholesterol levels. Contact your health care provider if you have trouble controlling your blood sugar, cholesterol, or drinking of alcohol. This information is not intended to replace advice given to you by your health care provider. Make sure you discuss any questions you have with your health care provider. Document Revised: 03/23/2020 Document Reviewed: 03/23/2020 Elsevier Patient Education  2022 [employer]  Nonalcoholic Fatty Liver Disease Diet, Adult Nonalcoholic fatty liver disease is a condition that causes fat to build up in and around the liver. The disease makes it harder for the liver to work the way that it should. Following a healthy diet can help to keep nonalcoholic fatty liver disease under control. It can also help to prevent or improve conditions that are associated with the disease, such as heart disease, diabetes, high blood pressure, and abnormal cholesterol levels. Along with regular exercise, this diet: Promotes weight loss. Helps to control blood sugar levels. Helps to improve the way that the body uses insulin. What are tips for following this plan? Reading food labels Always check food labels for: The amount of  saturated fat in a food. You should limit your intake of saturated fat. Saturated fat is found in foods that come from animals, including meat and dairy products such as butter, cheese, and whole milk. The amount of fiber in a food. You should choose high-fiber foods such as fruits, vegetables, and whole grains. Try to get 25-30 grams (g) of fiber a day.  Cooking When cooking, use heart-healthy oils that are high in monounsaturated fats. These include olive oil, canola oil, and avocado oil. Limit frying or deep-frying foods. Cook foods using healthy methods such as baking, boiling, steaming, and grilling instead. Meal planning You may want to keep track of how many calories you take in. Eating the right amount of calories will help you achieve a healthy weight. Meeting with a registered dietitian can help you get started. Limit how often you eat takeout and fast food. These foods are usually very high in fat, salt, and sugar. Use the glycemic index (GI) to plan your meals. The index tells you how quickly a food will raise your blood sugar. Choose low-GI foods (GI less than 55). These foods take a longer time to raise blood sugar. A registered dietitian can help you identify foods lower on the GI scale. Lifestyle You may want to follow a Mediterranean diet. This diet includes a lot of vegetables, lean meats or fish, whole grains, fruits, and healthy oils and  fats. What foods can I eat? Fruits Bananas. Apples. Oranges. Grapes. Papaya. Mango. Pomegranate. Kiwi. Grapefruit. Cherries. Vegetables Lettuce. Spinach. Peas. Beets. Cauliflower. Cabbage. Broccoli. Carrots. Tomatoes. Squash. Eggplant. Herbs. Peppers. Onions. Cucumbers. Brussels sprouts. Yams and sweet potatoes. Beans. Lentils. Grains Whole wheat or whole-grain foods, including breads, crackers, cereals, and pasta. Stone-ground whole wheat. Unsweetened oatmeal. Bulgur. Barley. Quinoa. Brown or wild rice. Corn or whole wheat flour  tortillas. Meats and other proteins Lean meats. Poultry. Tofu. Seafood and shellfish. Dairy Low-fat or fat-free dairy products, such as yogurt, cottage cheese, or cheese. Beverages Water. Sugar-free drinks. Tea. Coffee. Low-fat or skim milk. Milk alternatives, such as soy or almond milk. Real fruit juice. Fats and oils Avocado. Canola or olive oil. Nuts and nut butters. Seeds. Seasonings and condiments Mustard. Relish. Low-fat, low-sugar ketchup and barbecue sauce. Low-fat or fat-free mayonnaise. Sweets and desserts Sugar-free sweets. The items listed above may not be a complete list of foods and beverages you can eat. Contact a dietitian for more information. What foods should I limit or avoid? Meats and other proteins Limit red meat to 1-2 times a week. Dairy [employer]. Fats and oils Palm oil and coconut oil. Fried foods. Other foods Processed foods. Foods that contain a lot of salt or sodium. Sweets and desserts Sweets that contain sugar. Beverages Sweetened drinks, such as sweet tea, milkshakes, iced sweet drinks, and sodas. Alcohol. The items listed above may not be a complete list of foods and beverages you should avoid. Contact a dietitian for more information. Where to find more information The [employer] of Diabetes and Digestive and Kidney Diseases: [email or web address] Summary Nonalcoholic fatty liver disease is a condition that causes fat to build up in and around the liver. Following a healthy diet can help to keep nonalcoholic fatty liver disease under control. Your diet should be rich in fruits, vegetables, whole grains, and lean proteins. Limit your intake of saturated fat. Saturated fat is found in foods that come from animals, including meat and dairy products such as butter, cheese, and whole milk. This diet promotes weight loss, helps to control blood sugar levels, and helps to improve the way that the body uses insulin. This information is not intended to  replace advice given to you by your health care provider. Make sure you discuss any questions you have with your health care provider. Document Revised: 10/02/2018 Document Reviewed: 07/02/2018 Elsevier Patient Education  2022 [employer].

## 2021-04-25 NOTE — Progress Notes (Signed)
Chief Complaint  Patient presents with   Results    Pt wants to discuss Fatty Liver, Thyroid medication, and the recent breast surgery   Annual  1. Fatty liver noted on [place] 02/2021 disc and given diet info consider hep A/B vaccine  2. Hypothyroidism low tsh on levo 100 mcg  3. S/p left breast surgery and benign bx and excision x 2 12/2020 and 01/2021 dx mammogram due left breast     Review of Systems  Constitutional:  Negative for weight loss.  HENT:  Negative for hearing loss.   Eyes:  Negative for blurred vision.  Respiratory:  Negative for shortness of breath.   Cardiovascular:  Negative for chest pain.  Gastrointestinal:  Negative for abdominal pain.  Musculoskeletal:  Positive for myalgias.       +fibromyalgia pain  Skin:  Negative for rash.  Neurological:  Negative for headaches.  Psychiatric/Behavioral:  Negative for depression.        Stable mood declines therapy or change in meds   Past Medical History:  Diagnosis Date   Allergy    Anxiety    Benign essential tremor    right side   Complication of anesthesia    complications with epidural in 2002   Depression    Fibromyalgia    Herniated lumbar intervertebral disc    History of kidney stones    Hyperlipidemia    Hypertension    history of blood pressure   Hypothyroidism    Ovarian cyst 08/21/2016   One septated cyst, one simple cyst both on left ovary Feb 2018   Psoriasis    Thyroid disease    Type 2 diabetes mellitus ([place]) 11/29/2015   Vitamin D deficiency 11/01/2015   Past Surgical History:  Procedure Laterality Date   BREAST BIOPSY Left 01/19/2021   left breast stereo distortion path pending   BREAST BIOPSY WITH RADIO FREQUENCY LOCALIZER Left 02/14/2021   Procedure: LEFT BREAST EXCISIONAL BIOPSY WITH RADIO FREQUENCY LOCALIZER;  Surgeon: Herbert Pun, MD;  Location: [facility] ORS;  Service: General;  Laterality: Left;   CESAREAN SECTION     2002/2006   scar tissue removed     after 1 st c section    TUBAL LIGATION     Family History  Problem Relation Age of Onset   Cancer Mother        lung not smoker    Alzheimer's disease Father    Dementia Father    Parkinson's disease Father    Drug abuse Sister    Thyroid disease Sister    Thyroid disease Sister    Cancer Sister        cervical   Asthma Sister    Depression Sister    Hyperlipidemia Sister    Thyroid disease Sister    Depression Sister    Thyroid disease Sister    Depression Sister    Asthma Sister    Arthritis Sister    Kidney disease Sister    Breast cancer Maternal Aunt 40   Cancer Maternal Grandmother    Asthma Maternal Grandmother    Cancer Maternal Grandfather    Asthma Maternal Grandfather    Cancer Paternal Grandmother        ?type   Cancer Paternal Grandfather        ?type   Early death Brother        childbirth   Cancer Other        strong FH breast cancer 6/12 maternal relatives per pt and  also colon cnacer   Cancer Other        brain   Social History   Socioeconomic History   Marital status: Single    Spouse name: Not on file   Number of children: 2   Years of education: Not on file   Highest education level: Not on file  Occupational History   Not on file  Tobacco Use   Smoking status: Never   Smokeless tobacco: Never  Vaping Use   Vaping Use: Never used  Substance and Sexual Activity   Alcohol use: No    Alcohol/week: 0.0 standard drinks   Drug use: No   Sexual activity: Never  Other Topics Concern   Not on file  Social History Narrative   Disabled    Some college    Owns guns   Wears seat belt, safe in relationship    Single 2 kids    Social Determinants of Health   Financial Resource Strain: Low Risk    Difficulty of Paying Living Expenses: Not hard at all  Food Insecurity: No Food Insecurity   Worried About Charity fundraiser in the Last Year: Never true   [place] in the Last Year: Never true  Transportation Needs: No Transportation Needs   Lack of  Transportation (Medical): No   Lack of Transportation (Non-Medical): No  Physical Activity: Unknown   Days of Exercise per Week: 0 days   Minutes of Exercise per Session: Not on file  Stress: No Stress Concern Present   Feeling of Stress : Not at all  Social Connections: Unknown   Frequency of Communication with Friends and Family: More than three times a week   Frequency of Social Gatherings with Friends and Family: Once a week   Attends Religious Services: Not on Electrical engineer or Organizations: Not on file   Attends Archivist Meetings: Not on file   Marital Status: Not on file  Intimate Partner Violence: Not At Risk   Fear of Current or Ex-Partner: No   Emotionally Abused: No   Physically Abused: No   Sexually Abused: No   Current Meds  Medication Sig   alclomethasone (ACLOVATE) 0.05 % cream Apply topically 2 (two) times daily. Prn face as needed   atorvastatin (LIPITOR) 40 MG tablet Take 1 tablet (40 mg total) by mouth daily. After 6 pm   cetirizine (ZYRTEC) 10 MG tablet Take 1 tablet (10 mg total) by mouth daily as needed for allergies. (Patient taking differently: Take 10 mg by mouth daily.)   Cholecalciferol 50 MCG (2000 UT) TABS Take 2,000 Units by mouth at bedtime.   gabapentin (NEURONTIN) 300 MG capsule Take 1 capsule (300 mg total) by mouth at bedtime.   halobetasol (ULTRAVATE) 0.05 % cream Apply topically 2 (two) times daily. Prn to body not face, underarms, groin   levothyroxine (SYNTHROID) 100 MCG tablet Take 1 tablet (100 mcg total) by mouth daily before breakfast.   montelukast (SINGULAIR) 10 MG tablet Take 1 tablet (10 mg total) by mouth at bedtime. For allergies   Multiple Vitamins-Minerals (MULTIVITAMIN WITH MINERALS) tablet Take 1 tablet by mouth at bedtime. [place]   propranolol (INDERAL) 20 MG tablet Take 1 tablet (20 mg total) by mouth 2 (two) times daily.   SitaGLIPtin-MetFORMIN HCl (JANUMET XR) 50-1000 MG TB24 Take 1 tablet by  mouth daily. D/c metformin xr 1000 mg bid (Patient taking differently: Take 1 tablet by mouth daily.)   traZODone (  DESYREL) 100 MG tablet Take 0.5-1 tablets (50-100 mg total) by mouth at bedtime as needed. for sleep (Patient taking differently: Take 100 mg by mouth at bedtime.)   Allergies  Allergen Reactions   Lyrica [Pregabalin] Other (See Comments)    Patient reports of facial tingling Patient reports of facial tingling   Cymbalta [Duloxetine Hcl]     HTN, feels nothing doesn't care    Influenza Vaccines Other (See Comments)    Patient states she gets very sick and passes out a few days after the shot.    Strawberry Flavor Itching   Recent Results (from the past 2160 hour(s))  Comprehensive metabolic panel     Status: Abnormal   Collection Time: 01/31/21  9:21 AM  Result Value Ref Range   Sodium 137 135 - 145 mEq/L   Potassium 4.3 3.5 - 5.1 mEq/L   Chloride 104 96 - 112 mEq/L   CO2 22 19 - 32 mEq/L   Glucose, Bld 140 (H) 70 - 99 mg/dL   BUN 9 6 - 23 mg/dL   Creatinine, Ser 1.08 0.40 - 1.20 mg/dL   Total Bilirubin 0.3 0.2 - 1.2 mg/dL   Alkaline Phosphatase 71 39 - 117 U/L   AST 25 0 - 37 U/L   ALT 36 (H) 0 - 35 U/L   Total Protein 6.7 6.0 - 8.3 g/dL   Albumin 4.1 3.5 - 5.2 g/dL   GFR 60.14 >60.00 mL/min    Comment: Calculated using the CKD-EPI Creatinine Equation (2021)   Calcium 9.7 8.4 - 10.5 mg/dL  Lipid panel     Status: None   Collection Time: 01/31/21  9:21 AM  Result Value Ref Range   Cholesterol 134 0 - 200 mg/dL    Comment: ATP III Classification       Desirable:  < 200 mg/dL               Borderline High:  200 - 239 mg/dL          High:  > = 240 mg/dL   Triglycerides 110.0 0.0 - 149.0 mg/dL    Comment: Normal:  <150 mg/dLBorderline High:  150 - 199 mg/dL   HDL 44.40 >39.00 mg/dL   VLDL 22.0 0.0 - 40.0 mg/dL   LDL Cholesterol 68 0 - 99 mg/dL   Total CHOL/HDL Ratio 3     Comment:                Men          Women1/2 Average Risk     3.4          3.3Average Risk           5.0          4.42X Average Risk          9.6          7.13X Average Risk          15.0          11.0                       NonHDL 89.92     Comment: NOTE:  Non-HDL goal should be 30 mg/dL higher than patient's LDL goal (i.e. LDL goal of < 70 mg/dL, would have non-HDL goal of < 100 mg/dL)  CBC with Differential/Platelet     Status: None   Collection Time: 01/31/21  9:21 AM  Result Value Ref Range   WBC  8.8 4.0 - 10.5 K/uL   RBC 4.17 3.87 - 5.11 Mil/uL   Hemoglobin 12.9 12.0 - 15.0 g/dL   HCT 38.5 36.0 - 46.0 %   MCV 92.1 78.0 - 100.0 fl   MCHC 33.5 30.0 - 36.0 g/dL   RDW 12.3 11.5 - 15.5 %   Platelets 351.0 150.0 - 400.0 K/uL   Neutrophils Relative % 69.1 43.0 - 77.0 %   Lymphocytes Relative 21.8 12.0 - 46.0 %   Monocytes Relative 5.7 3.0 - 12.0 %   Eosinophils Relative 2.8 0.0 - 5.0 %   Basophils Relative 0.6 0.0 - 3.0 %   Neutro Abs 6.1 1.4 - 7.7 K/uL   Lymphs Abs 1.9 0.7 - 4.0 K/uL   Monocytes Absolute 0.5 0.1 - 1.0 K/uL   Eosinophils Absolute 0.2 0.0 - 0.7 K/uL   Basophils Absolute 0.0 0.0 - 0.1 K/uL  TSH     Status: Abnormal   Collection Time: 01/31/21  9:21 AM  Result Value Ref Range   TSH 0.05 (L) 0.35 - 5.50 uIU/mL  Hemoglobin A1c     Status: Abnormal   Collection Time: 01/31/21  9:21 AM  Result Value Ref Range   Hgb A1c MFr Bld 6.8 (H) 4.6 - 6.5 %    Comment: Glycemic Control Guidelines for People with Diabetes:Non Diabetic:  <6%Goal of Therapy: <7%Additional Action Suggested:  >8%   Vitamin D (25 hydroxy)     Status: None   Collection Time: 01/31/21  9:21 AM  Result Value Ref Range   VITD 67.87 30.00 - 100.00 ng/mL  Glucose, capillary     Status: Abnormal   Collection Time: 02/14/21  6:25 AM  Result Value Ref Range   Glucose-Capillary 174 (H) 70 - 99 mg/dL    Comment: Glucose reference range applies only to samples taken after fasting for at least 8 hours.  Surgical pathology     Status: None   Collection Time: 02/14/21  8:05 AM  Result Value Ref Range    SURGICAL PATHOLOGY      SURGICAL PATHOLOGY CASE: [id number] PATIENT: Tracy Stephens Surgical Pathology Report     Specimen Submitted: A. Breast, left  Clinical History: N64.89 complex sclerosing lesion of LT breast      DIAGNOSIS: A. BREAST, LEFT; EXCISIONAL BIOPSY: - COMPLEX SCLEROSING LESION. - BIOPSY SITE CHANGE AND COIL CLIP. - RF ID TAG IN PLACE. - NEGATIVE FOR ATYPIA AND MALIGNANCY.  Comment: The complex sclerosing lesion focally extends to the anterior margin.  GROSS DESCRIPTION: A. Labeled: Left breast excisional biopsy Received: Fresh Specimen radiograph image(s) available for review Radiographic findings: A coil clip and RFID are present Time in fixative: Collected at 8:05 AM on 02/14/2021 and placed in formalin at 8:15 AM on 02/14/2021                                   cold ischemic time: Approximately 10 minutes Total fixation time: Approximately 33 hours Type of procedure: Breast biopsy with radiofrequency localizer Location / laterality of specimen: Left br [place] Orientation of specimen: The specimen is received inked and oriented Inking: Anterior = green Inferior = blue Lateral = orange Medial = yellow Posterior = black Superior = red Size of specimen: 7.0 x 5.5 x 1.5 cm Skin: None identified Biopsy site: A biopsy site is identified with a coil shaped clip  Number of discrete masses: 1 Size of mass(es): 1.4 x 1.2 x 1.0  cm Description of mass(es): Tan-white, well-defined, indurated Distance between masses/clips: The coil shaped clip is within the mass Margins: Less than 0.1 cm from the anterior margin, 1.1 cm from the posterior margin, 1.1 cm from the superior margin, 2.9 cm from the inferior margin, 2.5 cm from the medial margin, and 1.0 cm from the lateral margin. Description of remainder of tissue: The remainder of the tissue is tan-yellow, lobular with a mild amount of admixed fibrous tissue  Block summary (the mass is entirely  submitted, 75% of specimen remaining): 1 -perpendicular sections of lateral margin  2-3-mass with anterior, posterior, and superior margins 4-5-mass with anterior, posterior, and inferior margin (clip site in A4) 6-8-mass with anterior, superior and inferior margins 9-perpendicular sections of medial margin  [facility] 02/15/2021   Final Diagnosis performed by Quay Burow, MD.   Electronically signed 02/16/2021 9:43:38AM The electronic signature indicates that the named Attending Pathologist has evaluated the specimen Technical component performed at [facility], [address] [place], [place] [address] Lab: [phone] Dir: Rush Farmer, MD, MMM  Professional component performed at [facility], [facility], [place], [place], [place] [address] Lab: [phone] Dir: Dellia Nims. Rubinas, MD   Glucose, capillary     Status: Abnormal   Collection Time: 02/14/21  8:36 AM  Result Value Ref Range   Glucose-Capillary 195 (H) 70 - 99 mg/dL    Comment: Glucose reference range applies only to samples taken after fasting for at least 8 hours.   Objective  Body mass index is 28.53 kg/m. Wt Readings from Last 3 Encounters:  04/25/21 156 lb (70.8 kg)  01/31/21 158 lb 12.8 oz (72 kg)  08/29/20 157 lb 4 oz (71.3 kg)   Temp Readings from Last 3 Encounters:  04/25/21 (!) 96 F (35.6 C)  02/14/21 (!) 97.1 F (36.2 C) (Temporal)  01/31/21 (!) 96.3 F (35.7 C) (Temporal)   BP Readings from Last 3 Encounters:  04/25/21 138/84  02/14/21 117/76  01/31/21 110/80   Pulse Readings from Last 3 Encounters:  04/25/21 79  02/14/21 (!) 58  01/31/21 71    Physical Exam Vitals and nursing note reviewed.  Constitutional:      Appearance: Normal appearance. She is well-developed, well-groomed and overweight.  HENT:     Head: Normocephalic and atraumatic.  Eyes:     Conjunctiva/sclera: Conjunctivae normal.     Pupils: Pupils are equal, round, and reactive to light.  Cardiovascular:      Rate and Rhythm: Normal rate and regular rhythm.     Heart sounds: Normal heart sounds. No murmur heard. Pulmonary:     Effort: Pulmonary effort is normal.     Breath sounds: Normal breath sounds.  Musculoskeletal:     Comments: Diffuse pain shoulders/arms upper body fibromyalgia  Skin:    General: Skin is warm and dry.  Neurological:     General: No focal deficit present.     Mental Status: She is alert and oriented to person, place, and time.     Gait: Gait normal.  Psychiatric:        Attention and Perception: Attention and perception normal.        Mood and Affect: Mood and affect normal.        Speech: Speech normal.        Behavior: Behavior normal. Behavior is cooperative.        Thought Content: Thought content normal.        Cognition and Memory: Cognition and memory normal.  Judgment: Judgment normal.    Assessment  Plan  Annual physical exam Declines flu shot sick and passed out x 3 times, declines shingrix  Tdap utd  pna 23 vx, prevar in future given DM 2, declines due to flu shot side effect rec MMR vaccine and hep B vaccine moderna 2/2 declines booster   Referred for mammogram prev FH breast and colon cancer pt needs to sch -order in encouraged to schedule  01/15/21 abnormal mammogram bx neg malignancy f/u surgery 01/2021 excisional surgery benign with clip marker and tracer   Colonoscopy referred [facility] 02/23/21 [facility]   pt did not go as of 04/25/21 declines for now will call when ready   Pap 08/15/17 neg pap neg HPV-[facility] saw 09/02/2018 f/u in 1 year   -referred [facility] 11/07/20 neg neg hpv [facility]   Never smoker, no etoh   Eye [place] 08/17/20 neg AE Dr. Neville Route normal neg retinopathy   Dermatology consider in future as of 08/29/20, 04/25/21 no need   Neurology Dr. Melrose Nakayama for tremor call if tremor worse   Fatty liver + likely reason elevated lfts  Consider hep A/B vaccines declines for now  Abnormal mammogram of left breast - Plan: [facility] DIAG  BREAST TOMO UNI LEFT, US BREAST LTD UNI LEFT INC AXILLA Due end of 06/2021   H/O breast surgery - Plan: [facility] DIAG BREAST TOMO UNI LEFT, US BREAST LTD UNI LEFT INC AXILLA Due end 06/2021   Hypothyroidism, unspecified type - Plan: TSH  On levo 100 mcg qd   Provider: Dr. Olivia Mackie McLean-Scocuzza-Internal Medicine

## 2021-04-30 ENCOUNTER — Telehealth: Payer: Self-pay

## 2021-04-30 ENCOUNTER — Other Ambulatory Visit: Payer: Self-pay | Admitting: Internal Medicine

## 2021-04-30 ENCOUNTER — Telehealth: Payer: Self-pay | Admitting: Internal Medicine

## 2021-04-30 DIAGNOSIS — E039 Hypothyroidism, unspecified: Secondary | ICD-10-CM

## 2021-04-30 MED ORDER — LEVOTHYROXINE SODIUM 75 MCG PO TABS: 75.0000 ug | ORAL_TABLET | Freq: Every day | ORAL | 3 refills | Status: DC

## 2021-04-30 NOTE — Telephone Encounter (Signed)
Per pt due for recall the mammograms are not clear  She is s/p biospy, excision and clip placement   1)when is mammo due and what type please I.e diagnostic vs screening 2) one side vs bilateral?   This is not clear to me Thank you    [name], [name], [name], MD; Tilford Pillar, CMA Good afternoon!   Due to that area being excised but does not need Diag mammo pt can go back to regular Mammo per [place]. Please advise and Thank you!

## 2021-04-30 NOTE — Telephone Encounter (Signed)
TSH has been ordered for future labs. 

## 2021-05-07 NOTE — Telephone Encounter (Signed)
Inform pt per radiology next mammogram screening due 01/15/22

## 2021-05-07 NOTE — Addendum Note (Signed)
Addended by: Quentin Ore on: 05/07/2021 10:44 AM   Modules accepted: Orders

## 2021-05-08 NOTE — Telephone Encounter (Signed)
Left message to return call 

## 2021-05-10 NOTE — Telephone Encounter (Signed)
I called and spoke with the patient and informed her that she could return to her screening mammogram and it was due 7/25/232 and she stated she had floaters and was informed to come every 6 months for screening.  Tracy Stephens,cma

## 2021-06-20 ENCOUNTER — Other Ambulatory Visit (INDEPENDENT_AMBULATORY_CARE_PROVIDER_SITE_OTHER): Payer: Medicare Other

## 2021-06-20 ENCOUNTER — Other Ambulatory Visit: Payer: Self-pay

## 2021-06-20 DIAGNOSIS — E039 Hypothyroidism, unspecified: Secondary | ICD-10-CM

## 2021-06-20 LAB — TSH: TSH: 4.5 u[IU]/mL (ref 0.35–5.50)

## 2021-07-24 ENCOUNTER — Ambulatory Visit: Payer: Medicare Other

## 2021-08-10 ENCOUNTER — Ambulatory Visit: Payer: Medicare Other

## 2021-08-10 ENCOUNTER — Telehealth: Payer: Self-pay

## 2021-08-10 NOTE — Telephone Encounter (Signed)
No answer when called for scheduled AWV. Left message to reschedule.  ?

## 2021-09-03 ENCOUNTER — Other Ambulatory Visit: Payer: Self-pay | Admitting: Internal Medicine

## 2021-09-03 DIAGNOSIS — E785 Hyperlipidemia, unspecified: Secondary | ICD-10-CM

## 2021-09-03 DIAGNOSIS — J309 Allergic rhinitis, unspecified: Secondary | ICD-10-CM

## 2021-09-03 DIAGNOSIS — E039 Hypothyroidism, unspecified: Secondary | ICD-10-CM

## 2021-09-03 DIAGNOSIS — G4700 Insomnia, unspecified: Secondary | ICD-10-CM

## 2021-09-03 DIAGNOSIS — M797 Fibromyalgia: Secondary | ICD-10-CM

## 2021-09-03 DIAGNOSIS — L409 Psoriasis, unspecified: Secondary | ICD-10-CM

## 2021-09-03 DIAGNOSIS — G250 Essential tremor: Secondary | ICD-10-CM

## 2021-09-03 NOTE — Telephone Encounter (Signed)
Patient called and wanted to make sure that her pharmacy sent over all her refills. ?

## 2021-09-05 ENCOUNTER — Ambulatory Visit (INDEPENDENT_AMBULATORY_CARE_PROVIDER_SITE_OTHER): Payer: Medicare Other

## 2021-09-05 VITALS — Ht 62.0 in | Wt 156.0 lb

## 2021-09-05 DIAGNOSIS — Z0000 Encounter for general adult medical examination without abnormal findings: Secondary | ICD-10-CM

## 2021-09-05 NOTE — Patient Instructions (Addendum)
Tracy Stephens , Thank you for taking time to come for your Medicare Wellness Visit. I appreciate your ongoing commitment to your health goals. Please review the following plan we discussed and let me know if I can assist you in the future.   These are the goals we discussed:  Goals       Patient Stated     I would like to lose a little weight (pt-stated)      Healthy diet Stay active        This is a list of the screening recommended for you and due dates:  Health Maintenance  Topic Date Due   Complete foot exam   01/06/2021   Hemoglobin A1C  08/03/2021   Eye exam for diabetics  08/17/2021   Urine Protein Check  08/29/2021   COVID-19 Vaccine (3 - Booster for Moderna series) 01/22/2022*   Colon Cancer Screening  04/25/2022*   Mammogram  01/03/2022   Pap Smear  11/08/2023   Tetanus Vaccine  05/06/2028   Hepatitis C Screening: USPSTF Recommendation to screen - Ages 18-79 yo.  Completed   HIV Screening  Completed   HPV Vaccine  Aged Out   Flu Shot  Discontinued   Zoster (Shingles) Vaccine  Discontinued  *Topic was postponed. The date shown is not the original due date.    Advanced directives: not yet completed  Conditions/risks identified: none new  Follow up in one year for your annual wellness visit.   Preventive Care 40-64 Years, Female Preventive care refers to lifestyle choices and visits with your health care provider that can promote health and wellness. What does preventive care include? A yearly physical exam. This is also called an annual well check. Dental exams once or twice a year. Routine eye exams. Ask your health care provider how often you should have your eyes checked. Personal lifestyle choices, including: Daily care of your teeth and gums. Regular physical activity. Eating a healthy diet. Avoiding tobacco and drug use. Limiting alcohol use. Practicing safe sex. Taking low-dose aspirin daily starting at age 47. Taking vitamin and mineral supplements  as recommended by your health care provider. What happens during an annual well check? The services and screenings done by your health care provider during your annual well check will depend on your age, overall health, lifestyle risk factors, and family history of disease. Counseling  Your health care provider may ask you questions about your: Alcohol use. Tobacco use. Drug use. Emotional well-being. Home and relationship well-being. Sexual activity. Eating habits. Work and work Astronomer. Method of birth control. Menstrual cycle. Pregnancy history. Screening  You may have the following tests or measurements: Height, weight, and BMI. Blood pressure. Lipid and cholesterol levels. These may be checked every 5 years, or more frequently if you are over 78 years old. Skin check. Lung cancer screening. You may have this screening every year starting at age 88 if you have a 30-pack-year history of smoking and currently smoke or have quit within the past 15 years. Fecal occult blood test (FOBT) of the stool. You may have this test every year starting at age 59. Flexible sigmoidoscopy or colonoscopy. You may have a sigmoidoscopy every 5 years or a colonoscopy every 10 years starting at age 71. Hepatitis C blood test. Hepatitis B blood test. Sexually transmitted disease (STD) testing. Diabetes screening. This is done by checking your blood sugar (glucose) after you have not eaten for a while (fasting). You may have this done every 1-3 years. Mammogram.  This may be done every 1-2 years. Talk to your health care provider about when you should start having regular mammograms. This may depend on whether you have a family history of breast cancer. BRCA-related cancer screening. This may be done if you have a family history of breast, ovarian, tubal, or peritoneal cancers. Pelvic exam and Pap test. This may be done every 3 years starting at age 71. Starting at age 60, this may be done every 5 years  if you have a Pap test in combination with an HPV test. Bone density scan. This is done to screen for osteoporosis. You may have this scan if you are at high risk for osteoporosis. Discuss your test results, treatment options, and if necessary, the need for more tests with your health care provider. Vaccines  Your health care provider may recommend certain vaccines, such as: Influenza vaccine. This is recommended every year. Tetanus, diphtheria, and acellular pertussis (Tdap, Td) vaccine. You may need a Td booster every 10 years. Zoster vaccine. You may need this after age 35. Pneumococcal 13-valent conjugate (PCV13) vaccine. You may need this if you have certain conditions and were not previously vaccinated. Pneumococcal polysaccharide (PPSV23) vaccine. You may need one or two doses if you smoke cigarettes or if you have certain conditions. Talk to your health care provider about which screenings and vaccines you need and how often you need them. This information is not intended to replace advice given to you by your health care provider. Make sure you discuss any questions you have with your health care provider. Document Released: 07/07/2015 Document Revised: 02/28/2016 Document Reviewed: 04/11/2015 Elsevier Interactive Patient Education  2017 [employer].    Fall Prevention in the Home Falls can cause injuries. They can happen to people of all ages. There are many things you can do to make your home safe and to help prevent falls. What can I do on the outside of my home? Regularly fix the edges of walkways and driveways and fix any cracks. Remove anything that might make you trip as you walk through a door, such as a raised step or threshold. Trim any bushes or trees on the path to your home. Use bright outdoor lighting. Clear any walking paths of anything that might make someone trip, such as rocks or tools. Regularly check to see if handrails are loose or broken. Make sure that both  sides of any steps have handrails. Any raised decks and porches should have guardrails on the edges. Have any leaves, snow, or ice cleared regularly. Use sand or salt on walking paths during winter. Clean up any spills in your garage right away. This includes oil or grease spills. What can I do in the bathroom? Use night lights. Install grab bars by the toilet and in the tub and shower. Do not use towel bars as grab bars. Use non-skid mats or decals in the tub or shower. If you need to sit down in the shower, use a plastic, non-slip stool. Keep the floor dry. Clean up any water that spills on the floor as soon as it happens. Remove soap buildup in the tub or shower regularly. Attach bath mats securely with double-sided non-slip rug tape. Do not have throw rugs and other things on the floor that can make you trip. What can I do in the bedroom? Use night lights. Make sure that you have a light by your bed that is easy to reach. Do not use any sheets or blankets that are  too big for your bed. They should not hang down onto the floor. Have a firm chair that has side arms. You can use this for support while you get dressed. Do not have throw rugs and other things on the floor that can make you trip. What can I do in the kitchen? Clean up any spills right away. Avoid walking on wet floors. Keep items that you use a lot in easy-to-reach places. If you need to reach something above you, use a strong step stool that has a grab bar. Keep electrical cords out of the way. Do not use floor polish or wax that makes floors slippery. If you must use wax, use non-skid floor wax. Do not have throw rugs and other things on the floor that can make you trip. What can I do with my stairs? Do not leave any items on the stairs. Make sure that there are handrails on both sides of the stairs and use them. Fix handrails that are broken or loose. Make sure that handrails are as long as the stairways. Check any  carpeting to make sure that it is firmly attached to the stairs. Fix any carpet that is loose or worn. Avoid having throw rugs at the top or bottom of the stairs. If you do have throw rugs, attach them to the floor with carpet tape. Make sure that you have a light switch at the top of the stairs and the bottom of the stairs. If you do not have them, ask someone to add them for you. What else can I do to help prevent falls? Wear shoes that: Do not have high heels. Have rubber bottoms. Are comfortable and fit you well. Are closed at the toe. Do not wear sandals. If you use a stepladder: Make sure that it is fully opened. Do not climb a closed stepladder. Make sure that both sides of the stepladder are locked into place. Ask someone to hold it for you, if possible. Clearly mark and make sure that you can see: Any grab bars or handrails. First and last steps. Where the edge of each step is. Use tools that help you move around (mobility aids) if they are needed. These include: Canes. Walkers. Scooters. Crutches. Turn on the lights when you go into a dark area. Replace any light bulbs as soon as they burn out. Set up your furniture so you have a clear path. Avoid moving your furniture around. If any of your floors are uneven, fix them. If there are any pets around you, be aware of where they are. Review your medicines with your doctor. Some medicines can make you feel dizzy. This can increase your chance of falling. Ask your doctor what other things that you can do to help prevent falls. This information is not intended to replace advice given to you by your health care provider. Make sure you discuss any questions you have with your health care provider. Document Released: 04/06/2009 Document Revised: 11/16/2015 Document Reviewed: 07/15/2014 Elsevier Interactive Patient Education  2017 [employer].

## 2021-09-05 NOTE — Progress Notes (Signed)
Subjective:   Tracy Stephens is a 51 y.o. female who presents for Medicare Annual (Subsequent) preventive examination.  Review of Systems    No ROS.  Medicare Wellness Virtual Visit.  Visual/audio telehealth visit, UTA vital signs.   See social history for additional risk factors.   Cardiac Risk Factors include: advanced age (>44men, >53 women);diabetes mellitus;hypertension     Objective:    Today's Vitals   09/05/21 1459  Weight: 156 lb (70.8 kg)  Height: 5\' 2"  (1.575 m)   Body mass index is 28.53 kg/m.  Advanced Directives 09/05/2021 02/14/2021 02/09/2021 07/21/2020 11/11/2016 08/12/2016 05/09/2016  Does Patient Have a Medical Advance Directive? No No No No No No No  Does patient want to make changes to medical advance directive? - - - No - Patient declined - - -  Would patient like information on creating a medical advance directive? No - Patient declined No - Patient declined - - - - No - patient declined information   Current Medications (verified) Outpatient Encounter Medications as of 09/05/2021  Medication Sig   alclomethasone (ACLOVATE) 0.05 % cream Apply topically 2 (two) times daily. Prn face as needed   atorvastatin (LIPITOR) 40 MG tablet TAKE 1 TABLET BY MOUTH EVERYDAY AT BEDTIME   cetirizine (ZYRTEC) 10 MG tablet TAKE 1 TABLET BY MOUTH EVERY DAY AS NEEDED FOR ALLERGY   Cholecalciferol 50 MCG (2000 UT) TABS Take 2,000 Units by mouth at bedtime.   gabapentin (NEURONTIN) 300 MG capsule TAKE 1 CAPSULE BY MOUTH EVERYDAY AT BEDTIME   halobetasol (ULTRAVATE) 0.05 % cream APPLY TOPICALLY 2 (TWO) TIMES DAILY. AS NEEDED TO BODY NOT FACE, UNDERARMS, GROIN   levothyroxine (SYNTHROID) 75 MCG tablet Take 1 tablet (75 mcg total) by mouth daily before breakfast.   levothyroxine (SYNTHROID) 88 MCG tablet TAKE 1 TABLET BY MOUTH 30 MINUTE BEFORE FOOD. (STOP TABLET)   montelukast (SINGULAIR) 10 MG tablet TAKE 1 TABLET (10 MG TOTAL) BY MOUTH AT BEDTIME. FOR ALLERGIES   Multiple  Vitamins-Minerals (MULTIVITAMIN WITH MINERALS) tablet Take 1 tablet by mouth at bedtime. [place]   propranolol (INDERAL) 20 MG tablet TAKE 1 TABLET BY MOUTH TWICE A DAY   SitaGLIPtin-MetFORMIN HCl (JANUMET XR) 50-1000 MG TB24 Take 1 tablet by mouth daily. D/c metformin xr 1000 mg bid (Patient taking differently: Take 1 tablet by mouth daily.)   traZODone (DESYREL) 100 MG tablet TAKE 0.5-1 TABLETS (50-100 MG TOTAL) BY MOUTH AT BEDTIME AS NEEDED. FOR SLEEP   No facility-administered encounter medications on file as of 09/05/2021.   Allergies (verified) Lyrica [pregabalin], Cymbalta [duloxetine hcl], Influenza vaccines, and Strawberry flavor   History: Past Medical History:  Diagnosis Date   Allergy    Anxiety    Benign essential tremor    right side   Complication of anesthesia    complications with epidural in 2002   Depression    Fibromyalgia    Herniated lumbar intervertebral disc    History of kidney stones    Hyperlipidemia    Hypertension    history of blood pressure   Hypothyroidism    Ovarian cyst 08/21/2016   One septated cyst, one simple cyst both on left ovary Feb 2018   Psoriasis    Thyroid disease    Type 2 diabetes mellitus (HCC) 11/29/2015   Vitamin D deficiency 11/01/2015   Past Surgical History:  Procedure Laterality Date   BREAST BIOPSY Left 01/19/2021   left breast stereo distortion path pending   BREAST BIOPSY WITH RADIO FREQUENCY  LOCALIZER Left 02/14/2021   Procedure: LEFT BREAST EXCISIONAL BIOPSY WITH RADIO FREQUENCY LOCALIZER;  Surgeon: Carolan Shiver, MD;  Location: [facility] ORS;  Service: General;  Laterality: Left;   CESAREAN SECTION     2002/2006   scar tissue removed     after 1 st c section   TUBAL LIGATION     Family History  Problem Relation Age of Onset   Cancer Mother        lung not smoker    Alzheimer's disease Father    Dementia Father    Parkinson's disease Father    Drug abuse Sister    Thyroid disease Sister    Thyroid  disease Sister    Cancer Sister        cervical   Asthma Sister    Depression Sister    Hyperlipidemia Sister    Thyroid disease Sister    Depression Sister    Thyroid disease Sister    Depression Sister    Asthma Sister    Arthritis Sister    Kidney disease Sister    Breast cancer Maternal Aunt 40   Cancer Maternal Grandmother    Asthma Maternal Grandmother    Cancer Maternal Grandfather    Asthma Maternal Grandfather    Cancer Paternal Grandmother        ?type   Cancer Paternal Grandfather        ?type   Early death Brother        childbirth   Cancer Other        strong FH breast cancer 6/12 maternal relatives per pt and also colon cnacer   Cancer Other        brain   Social History   Socioeconomic History   Marital status: Single    Spouse name: Not on file   Number of children: 2   Years of education: Not on file   Highest education level: Not on file  Occupational History   Not on file  Tobacco Use   Smoking status: Never   Smokeless tobacco: Never  Vaping Use   Vaping Use: Never used  Substance and Sexual Activity   Alcohol use: No    Alcohol/week: 0.0 standard drinks   Drug use: No   Sexual activity: Never  Other Topics Concern   Not on file  Social History Narrative   Disabled    Some college    Owns guns   Wears seat belt, safe in relationship    Single 2 kids    Social Determinants of Health   Financial Resource Strain: Low Risk    Difficulty of Paying Living Expenses: Not hard at all  Food Insecurity: No Food Insecurity   Worried About Programme researcher, broadcasting/film/video in the Last Year: Never true   Ran Out of Food in the Last Year: Never true  Transportation Needs: No Transportation Needs   Lack of Transportation (Medical): No   Lack of Transportation (Non-Medical): No  Physical Activity: Not on file  Stress: No Stress Concern Present   Feeling of Stress : Not at all  Social Connections: Unknown   Frequency of Communication with Friends and  Family: Never   Frequency of Social Gatherings with Friends and Family: More than three times a week   Attends Religious Services: Not on file   Active Member of Clubs or Organizations: Not on file   Attends Banker Meetings: Not on file   Marital Status: Not on file   Tobacco Counseling  Counseling given: Not Answered  Clinical Intake:  Pre-visit preparation completed: Yes        Diabetes: No  How often do you need to have someone help you when you read instructions, pamphlets, or other written materials from your doctor or pharmacy?: 1 - Never  Nutrition Risk Assessment: Does the patient have any non-healing wounds?  No   Financial Strains and Diabetes Management: Are you having any financial strains with the device, your supplies or your medication? No .  Does the patient want to be seen by Chronic Care Management for management of their diabetes?  No  Would the patient like to be referred to a Nutritionist or for Diabetic Management?  No     Interpreter Needed?: No    Activities of Daily Living In your present state of health, do you have any difficulty performing the following activities: 09/05/2021 02/09/2021  Hearing? N Y  Comment - left ear - hearing aid but does not wear  Vision? N N  Comment - glasses  Difficulty concentrating or making decisions? N N  Walking or climbing stairs? N N  Dressing or bathing? N N  Doing errands, shopping? Y N  Comment She does not drive Chief Operating Officer and eating ? N -  Using the Toilet? N -  In the past six months, have you accidently leaked urine? N -  Do you have problems with loss of bowel control? N -  Managing your Medications? N -  Managing your Finances? N -  Housekeeping or managing your Housekeeping? N -  Some recent data might be hidden   Patient Care Team: McLean-Scocuzza, [name], MD as PCP - General (Internal Medicine)  Indicate any recent Medical Services you may have received from other than  [facility] providers in the past year (date may be approximate).     Assessment:   This is a routine wellness examination for Tracy Stephens.  Virtual Visit via Telephone Note  I connected with  Tracy Stephens on 09/05/21 at  2:45 PM EDT by telephone and verified that I am speaking with the correct person using two identifiers.  Persons participating in the virtual visit: patient/Nurse Health Advisor   I discussed the limitations of evaluation and management service by telehealth. The patient expressed understanding and agreed to proceed.  We continued and completed visit with audio only. Some vital signs may be absent or patient reported.   Hearing/Vision screen Hearing Screening - Comments:: Difficulty hearing Chooses not to wear hearing aids Vision Screening - Comments:: Wears corrective lenses   Dietary issues and exercise activities discussed: Current Exercise Habits: Home exercise routine, Type of exercise: strength training/weights;walking, Intensity: Mild Healthy diet Good water intake   Goals Addressed               This Visit's Progress     Patient Stated     I would like to lose a little weight (pt-stated)        Healthy diet Stay active       Depression Screen PHQ 2/9 Scores 09/05/2021 04/25/2021 07/21/2020 01/07/2020 03/31/2019 11/06/2018 01/14/2018  PHQ - 2 Score 0 2 0 1 1 0 4  PHQ- 9 Score - - - 4 7 - 10    Fall Risk Fall Risk  09/05/2021 04/25/2021 01/31/2021 07/21/2020 01/07/2020  Falls in the past year? 0 0 0 0 0  Number falls in past yr: - 0 0 0 0  Injury with Fall? 0 0 0 0 0  Risk for fall due to : - - No Fall Risks - -  Follow up Falls evaluation completed Falls evaluation completed Falls evaluation completed Falls evaluation completed Falls evaluation completed    FALL RISK PREVENTION PERTAINING TO THE HOME: Home free of loose throw rugs in walkways, pet beds, electrical cords, etc? Yes  Adequate lighting in your home to reduce risk of falls? Yes    ASSISTIVE DEVICES UTILIZED TO PREVENT FALLS: Life alert? No  Use of a cane, walker or w/c? No   TIMED UP AND GO: Was the test performed? No .   Cognitive Function:  Patient is alert and oriented x3.    6CIT Screen 09/05/2021 07/21/2020  What Year? 0 points 0 points  What month? 0 points 0 points  What time? 0 points 0 points  Count back from 20 0 points 0 points  Months in reverse 0 points 0 points  Repeat phrase 0 points -  Total Score 0 -   Immunizations Immunization History  Administered Date(s) Administered   Influenza,inj,Quad PF,6+ Mos 04/20/2015, 05/23/2017   Influenza-Unspecified 04/02/2014, 04/20/2015   Moderna Sars-Covid-2 Vaccination 01/24/2020, 02/25/2020   Tdap 05/06/2018   Screening Tests Health Maintenance  Topic Date Due   FOOT EXAM  01/06/2021   HEMOGLOBIN A1C  08/03/2021   OPHTHALMOLOGY EXAM  08/17/2021   URINE MICROALBUMIN  08/29/2021   COVID-19 Vaccine (3 - Booster for Moderna series) 01/22/2022 (Originally 04/21/2020)   COLONOSCOPY (Pts 45-46yrs Insurance coverage will need to be confirmed)  04/25/2022 (Originally 02/28/2016)   MAMMOGRAM  01/03/2022   PAP SMEAR-Modifier  11/08/2023   TETANUS/TDAP  05/06/2028   Hepatitis C Screening  Completed   HIV Screening  Completed   HPV VACCINES  Aged Out   INFLUENZA VACCINE  Discontinued   Zoster Vaccines- Shingrix  Discontinued   Health Maintenance Health Maintenance Due  Topic Date Due   FOOT EXAM  01/06/2021   HEMOGLOBIN A1C  08/03/2021   OPHTHALMOLOGY EXAM  08/17/2021   URINE MICROALBUMIN  08/29/2021   Mammogram- plans to schedule  Lung Cancer Screening: (Low Dose CT Chest recommended if Age 66-80 years, 30 pack-year currently smoking OR have quit w/in 15years.) does not qualify.   Vision Screening: Recommended annual ophthalmology exams for early detection of glaucoma and other disorders of the eye.  Dental Screening: Recommended annual dental exams for proper oral hygiene  Community  Resource Referral / Chronic Care Management: CRR required this visit?  No   CCM required this visit?  No      Plan:   Keep all routine maintenance appointments.   I have personally reviewed and noted the following in the patient's chart:   Medical and social history Use of alcohol, tobacco or illicit drugs  Current medications and supplements including opioid prescriptions.  Functional ability and status Nutritional status Physical activity Advanced directives List of other physicians Hospitalizations, surgeries, and ER visits in previous 12 months Vitals Screenings to include cognitive, depression, and falls Referrals and appointments  In addition, I have reviewed and discussed with patient certain preventive protocols, quality metrics, and best practice recommendations. A written personalized care plan for preventive services as well as general preventive health recommendations were provided to patient.     Ashok Pall, LPN   [id number]

## 2021-12-10 IMAGING — MG MM PLC BREAST LOC DEV 1ST LESION INC MAMMO GUIDE*L*
8 of 9 series · 8 of 9 positions shown · non-contrast
Comparison: Previous exam(s)

CLINICAL DATA: Radiofrequency localization prior to surgery.

EXAM:
MAMMOGRAPHIC GUIDED RADIOFREQUENCY DEVICE
LOCALIZATION OF THE LEFT BREAST

[L ML (1 of 4)]
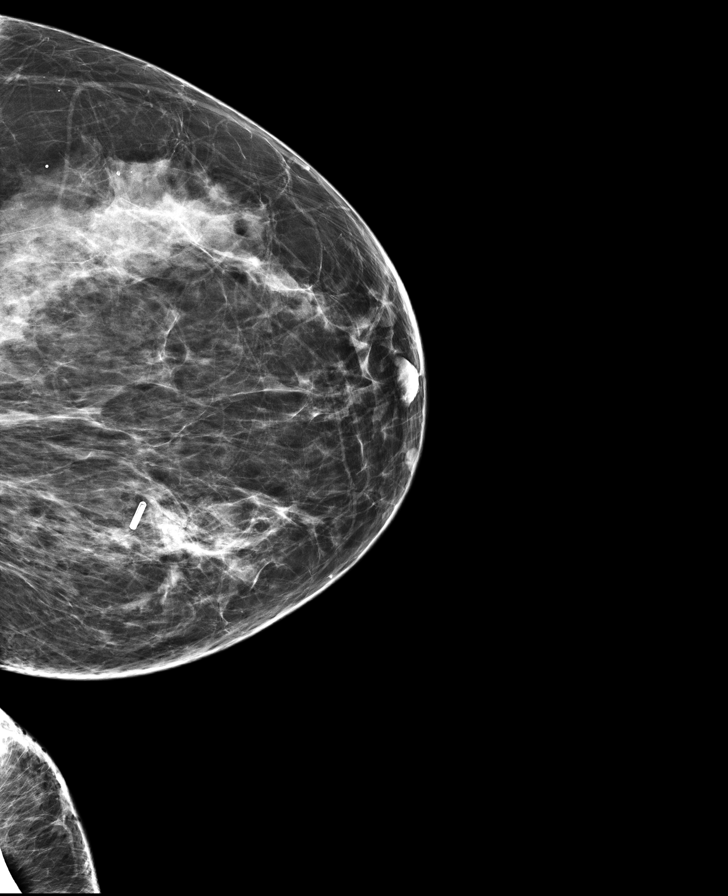

[L CC (1 of 4)]
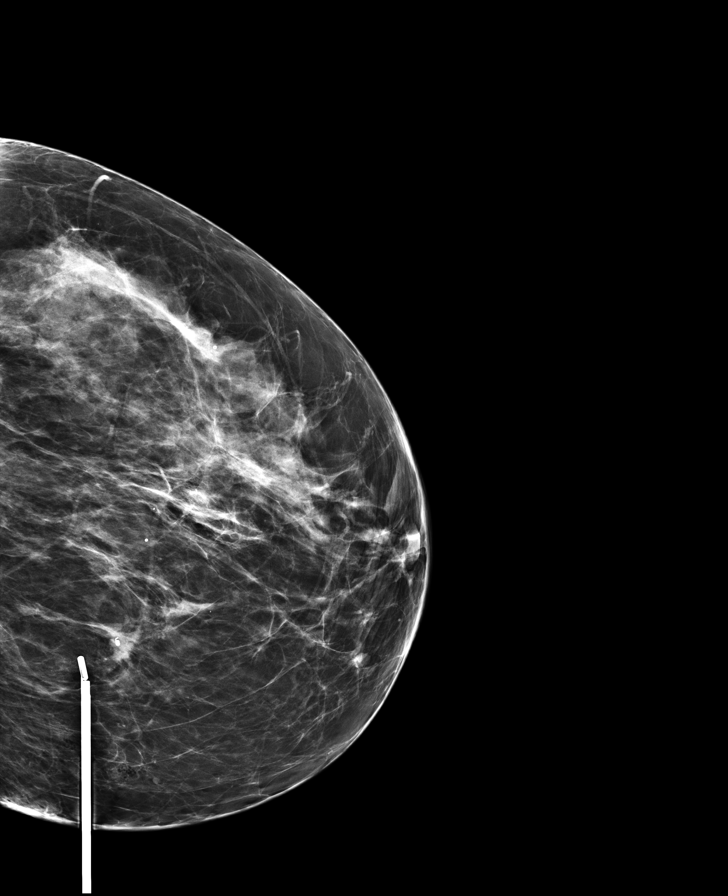

[L ML (2 of 4)]
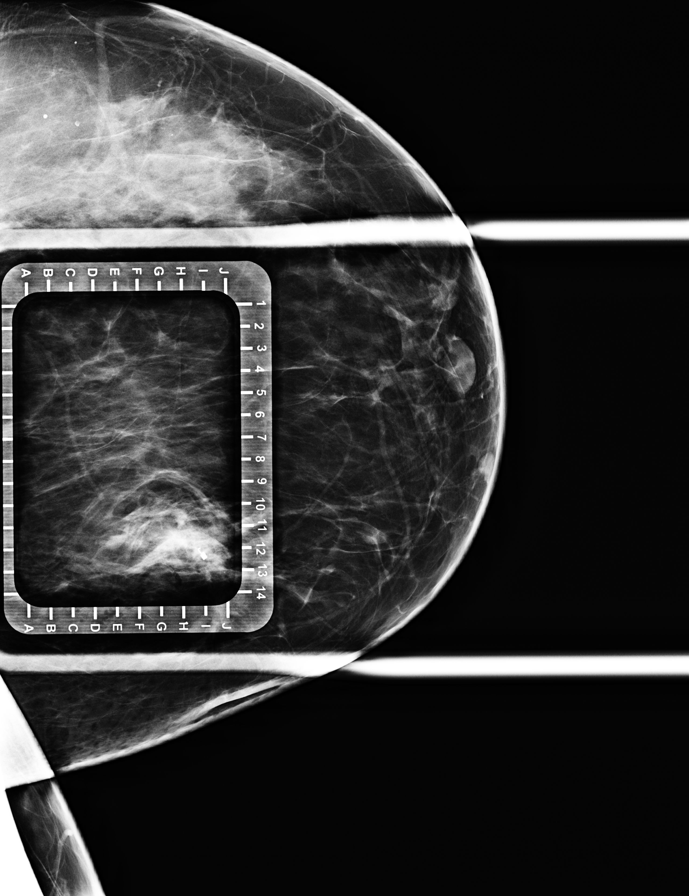

[L ML (3 of 4)]
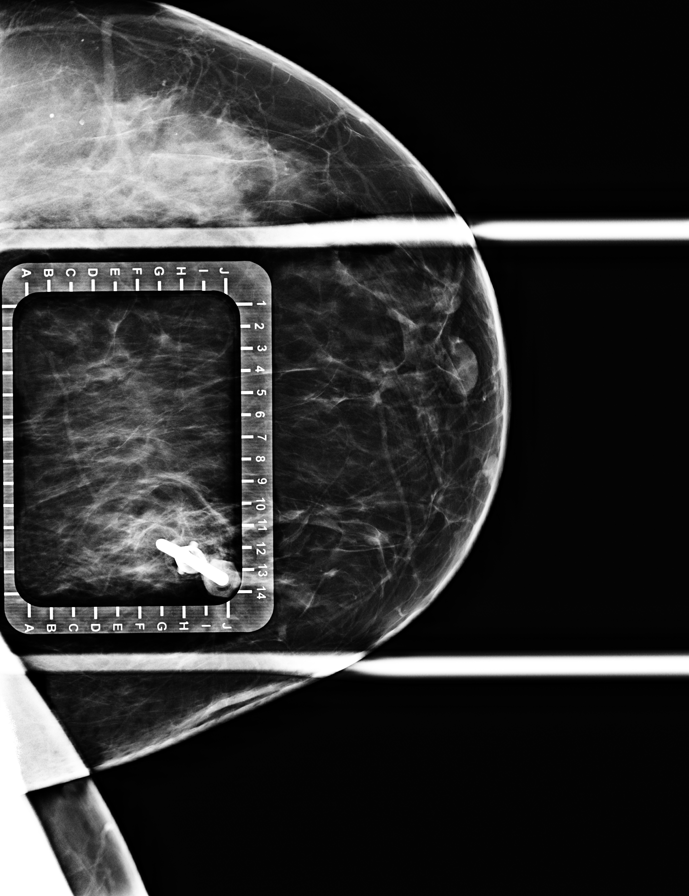

[L CC (2 of 4)]
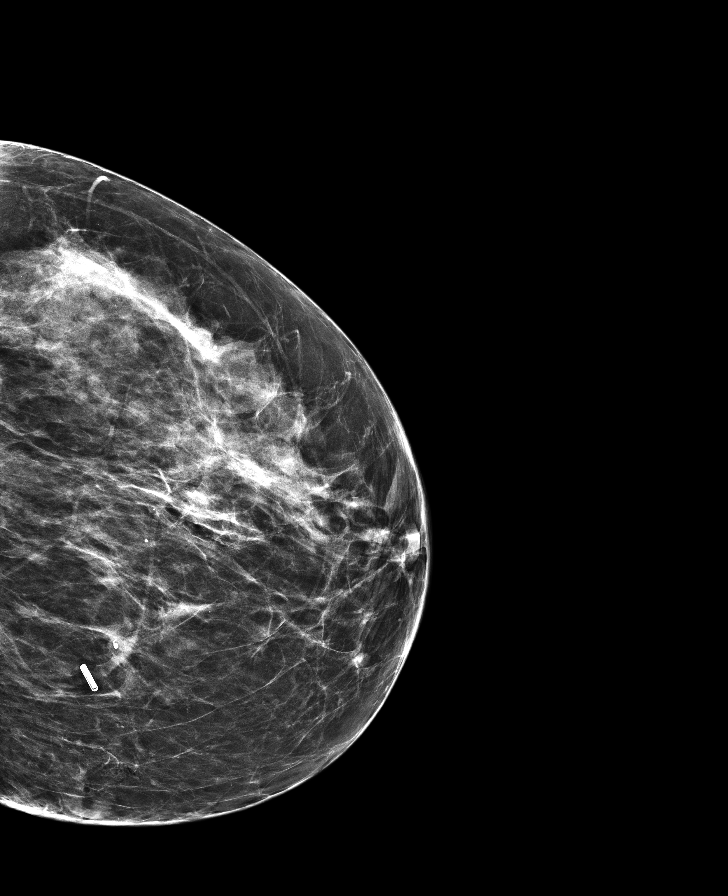

[L CC (3 of 4)]
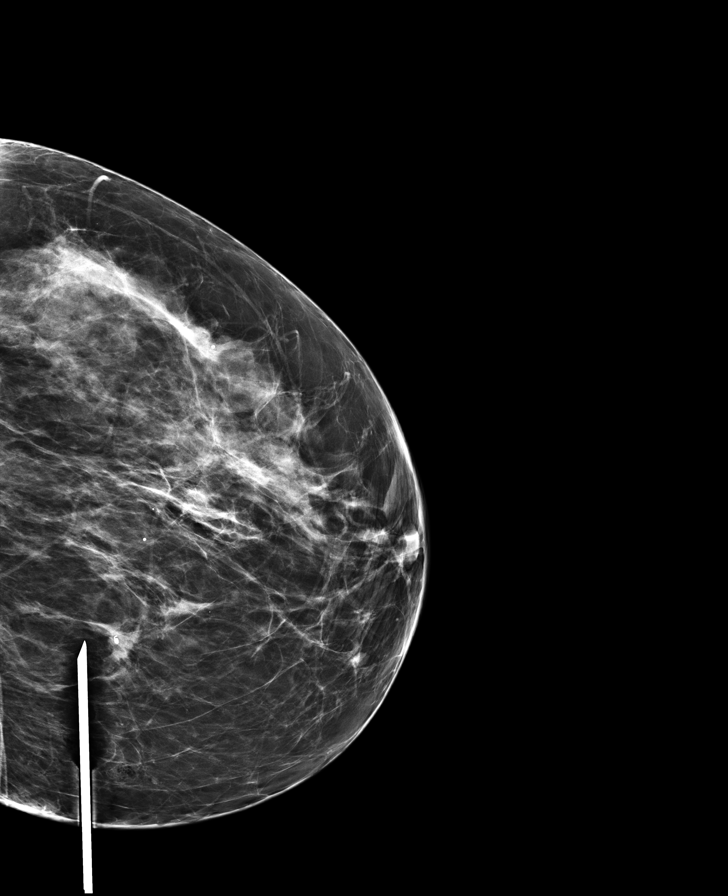

[L CC (4 of 4)]
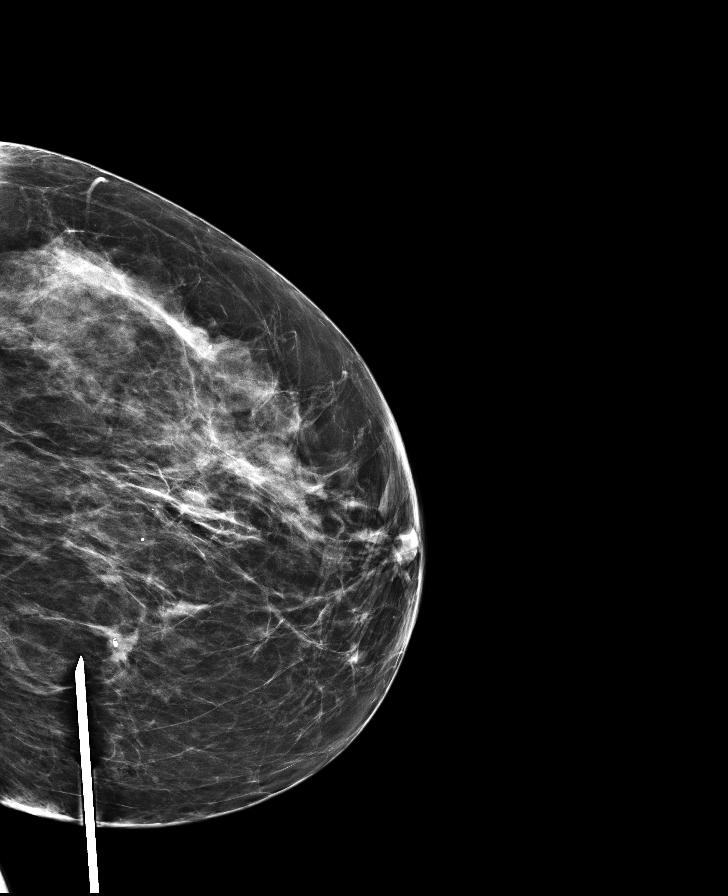

[L ML (4 of 4)]
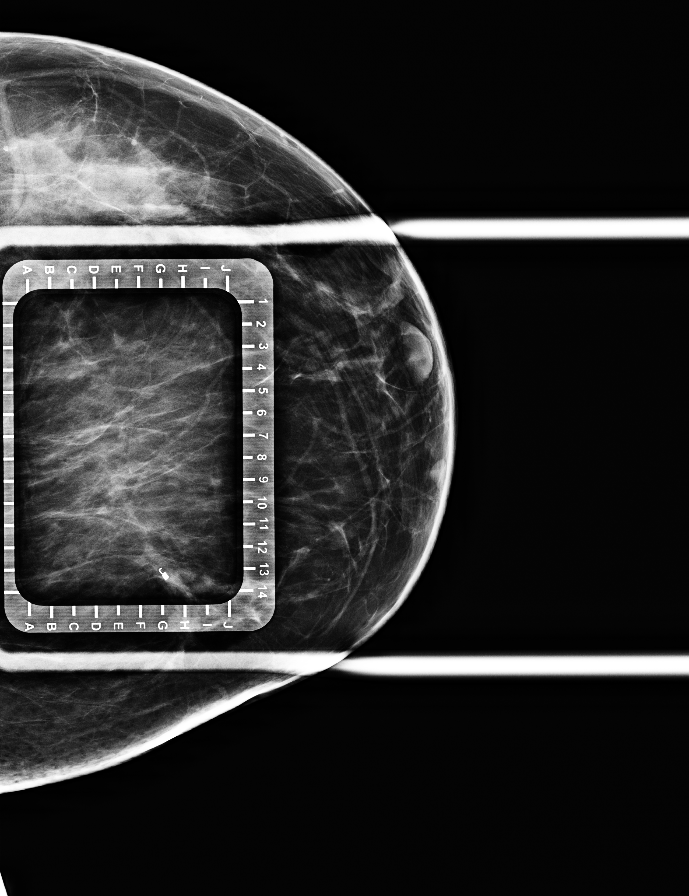

[8 of 9 positions shown; findings below may reference images not displayed]

FINDINGS: Patient presents for radiofrequency device localization prior to
surgery. I met with the patient and we discussed the procedure of
radiofrequency device localization including benefits and
alternatives. We discussed the high likelihood of a successful
procedure. We discussed the risks of the procedure including
infection, bleeding, tissue injury and further surgery. Informed,
written consent was given.

The usual time-out protocol was performed immediately prior to the
procedure.

Using mammographic guidance, sterile technique, 1% lidocaine as
local anesthesia, a radiofrequency tag was used to localize coil
shaped using a medial to lateral approach.

The follow-up mammogram images confirm that the RF device is in the
expected location and are marked for Dr. Fiayz.

Follow-up survey of the patient confirms the presence of the RF
device.

The patient tolerated the procedure well and was released from the
[REDACTED].
IMPRESSION: Radiofrequency device localization of the left breast. No apparent
complications.

## 2021-12-12 IMAGING — MG MM BREAST SURGICAL SPECIMEN
1 series · 1 of 1 positions shown · non-contrast
Comparison: Previous exam(s).

CLINICAL DATA: Status post surgical excision of a possible complex
sclerosing lesion versus complex fibroadenoma

EXAM:
SPECIMEN RADIOGRAPH OF THE LEFT BREAST

[L]
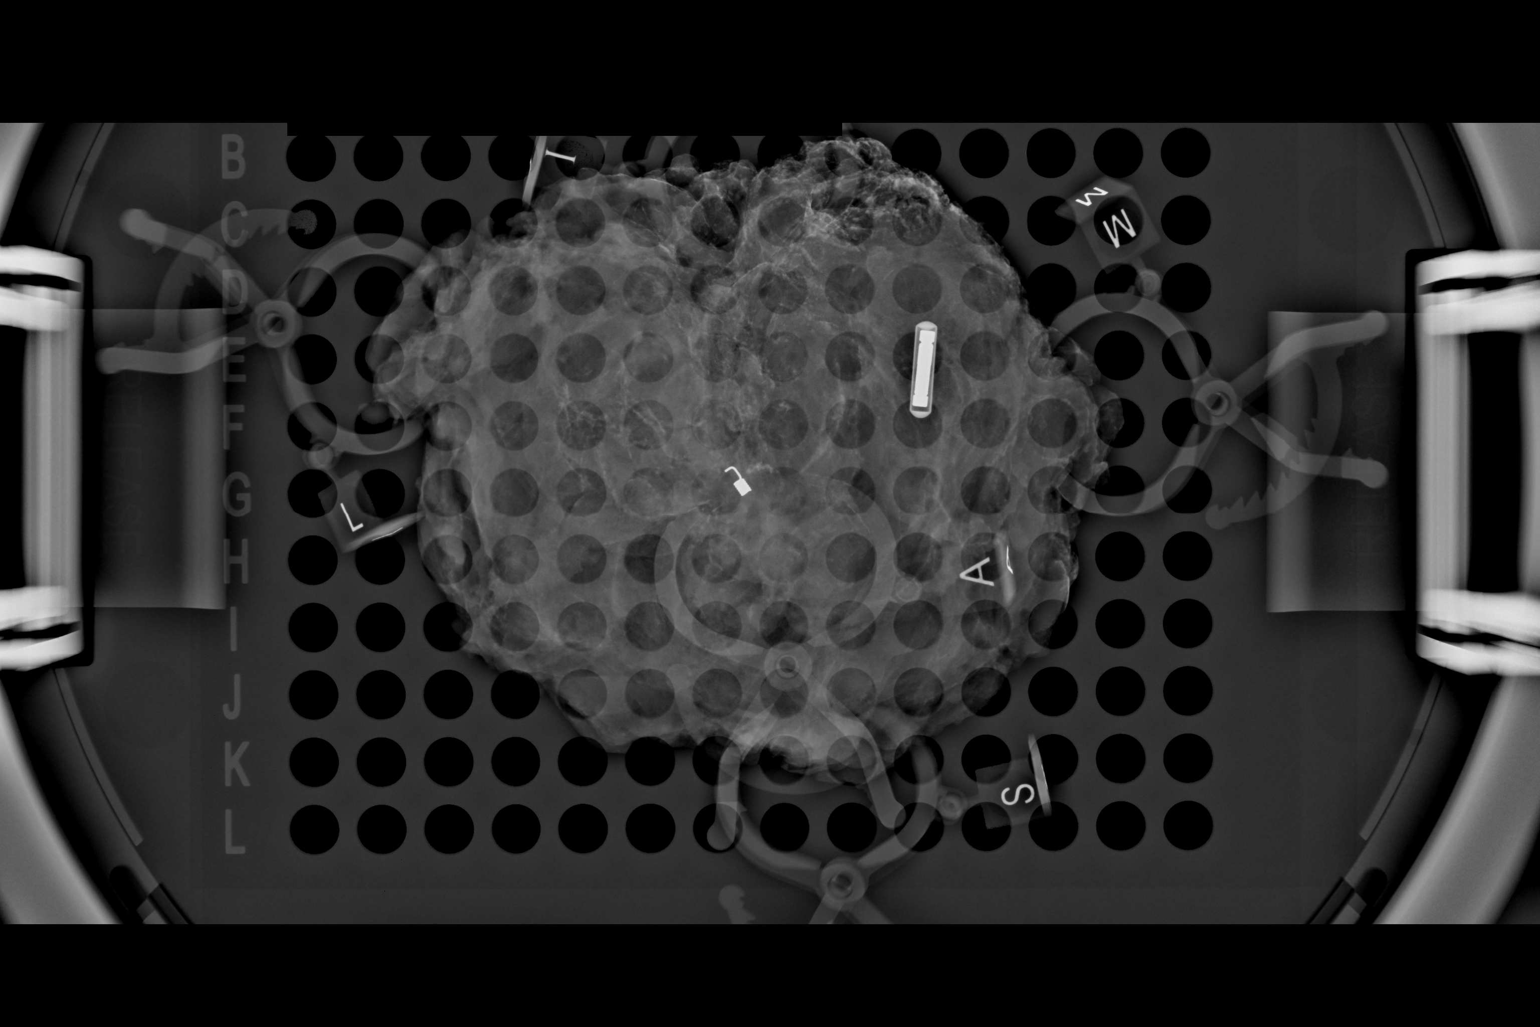

[1 of 1 positions shown; findings below may reference images not displayed]

FINDINGS: Status post excision of the LEFT breast. The radiofrequency tag and
COIL shaped clip are present within the specimen.
IMPRESSION: Specimen radiograph of the LEFT breast.

## 2022-01-24 ENCOUNTER — Other Ambulatory Visit: Payer: Self-pay | Admitting: Internal Medicine

## 2022-01-24 DIAGNOSIS — I152 Hypertension secondary to endocrine disorders: Secondary | ICD-10-CM

## 2022-01-24 DIAGNOSIS — E1165 Type 2 diabetes mellitus with hyperglycemia: Secondary | ICD-10-CM

## 2022-02-27 ENCOUNTER — Other Ambulatory Visit: Payer: Self-pay | Admitting: Internal Medicine

## 2022-02-27 ENCOUNTER — Ambulatory Visit (INDEPENDENT_AMBULATORY_CARE_PROVIDER_SITE_OTHER): Payer: Medicare Other | Admitting: Internal Medicine

## 2022-02-27 ENCOUNTER — Encounter: Payer: Self-pay | Admitting: Internal Medicine

## 2022-02-27 VITALS — BP 116/60 | HR 65 | Temp 98.0°F | Ht 62.0 in | Wt 154.4 lb

## 2022-02-27 DIAGNOSIS — G250 Essential tremor: Secondary | ICD-10-CM

## 2022-02-27 DIAGNOSIS — E119 Type 2 diabetes mellitus without complications: Secondary | ICD-10-CM

## 2022-02-27 DIAGNOSIS — N3000 Acute cystitis without hematuria: Secondary | ICD-10-CM

## 2022-02-27 DIAGNOSIS — I152 Hypertension secondary to endocrine disorders: Secondary | ICD-10-CM

## 2022-02-27 DIAGNOSIS — N19 Unspecified kidney failure: Secondary | ICD-10-CM

## 2022-02-27 DIAGNOSIS — E1165 Type 2 diabetes mellitus with hyperglycemia: Secondary | ICD-10-CM | POA: Diagnosis not present

## 2022-02-27 DIAGNOSIS — E1159 Type 2 diabetes mellitus with other circulatory complications: Secondary | ICD-10-CM

## 2022-02-27 DIAGNOSIS — E039 Hypothyroidism, unspecified: Secondary | ICD-10-CM

## 2022-02-27 DIAGNOSIS — J309 Allergic rhinitis, unspecified: Secondary | ICD-10-CM | POA: Diagnosis not present

## 2022-02-27 DIAGNOSIS — R5383 Other fatigue: Secondary | ICD-10-CM

## 2022-02-27 DIAGNOSIS — M797 Fibromyalgia: Secondary | ICD-10-CM | POA: Diagnosis not present

## 2022-02-27 DIAGNOSIS — N179 Acute kidney failure, unspecified: Secondary | ICD-10-CM

## 2022-02-27 DIAGNOSIS — G4700 Insomnia, unspecified: Secondary | ICD-10-CM

## 2022-02-27 DIAGNOSIS — E785 Hyperlipidemia, unspecified: Secondary | ICD-10-CM | POA: Diagnosis not present

## 2022-02-27 LAB — COMPREHENSIVE METABOLIC PANEL
ALT: 30 U/L (ref 0–35)
AST: 29 U/L (ref 0–37)
Albumin: 4.2 g/dL (ref 3.5–5.2)
Alkaline Phosphatase: 74 U/L (ref 39–117)
BUN: 9 mg/dL (ref 6–23)
CO2: 27 mEq/L (ref 19–32)
Calcium: 10.2 mg/dL (ref 8.4–10.5)
Chloride: 106 mEq/L (ref 96–112)
Creatinine, Ser: 1.41 mg/dL — ABNORMAL HIGH (ref 0.40–1.20)
GFR: 43.34 mL/min — ABNORMAL LOW (ref >60.00)
Glucose, Bld: 113 mg/dL — ABNORMAL HIGH (ref 70–99)
Potassium: 5 mEq/L (ref 3.5–5.1)
Sodium: 143 mEq/L (ref 135–145)
Total Bilirubin: 0.5 mg/dL (ref 0.2–1.2)
Total Protein: 7.3 g/dL (ref 6.0–8.3)

## 2022-02-27 LAB — LIPID PANEL
Cholesterol: 143 mg/dL (ref 0–200)
HDL: 42.5 mg/dL (ref >39.00)
LDL Cholesterol: 75 mg/dL (ref 0–99)
NonHDL: 100.87
Total CHOL/HDL Ratio: 3
Triglycerides: 128 mg/dL (ref 0.0–149.0)
VLDL: 25.6 mg/dL (ref 0.0–40.0)

## 2022-02-27 LAB — CBC WITH DIFFERENTIAL/PLATELET
Basophils Absolute: 0 10*3/uL (ref 0.0–0.1)
Basophils Relative: 0.4 % (ref 0.0–3.0)
Eosinophils Absolute: 0.3 10*3/uL (ref 0.0–0.7)
Eosinophils Relative: 2.6 % (ref 0.0–5.0)
HCT: 39.9 % (ref 36.0–46.0)
Hemoglobin: 13.3 g/dL (ref 12.0–15.0)
Lymphocytes Relative: 24.1 % (ref 12.0–46.0)
Lymphs Abs: 2.4 10*3/uL (ref 0.7–4.0)
MCHC: 33.3 g/dL (ref 30.0–36.0)
MCV: 92.8 fl (ref 78.0–100.0)
Monocytes Absolute: 0.6 10*3/uL (ref 0.1–1.0)
Monocytes Relative: 6.3 % (ref 3.0–12.0)
Neutro Abs: 6.5 10*3/uL (ref 1.4–7.7)
Neutrophils Relative %: 66.6 % (ref 43.0–77.0)
Platelets: 316 10*3/uL (ref 150.0–400.0)
RBC: 4.3 Mil/uL (ref 3.87–5.11)
RDW: 12.8 % (ref 11.5–15.5)
WBC: 9.8 10*3/uL (ref 4.0–10.5)

## 2022-02-27 LAB — HEMOGLOBIN A1C: Hgb A1c MFr Bld: 6.9 % — ABNORMAL HIGH (ref 4.6–6.5)

## 2022-02-27 LAB — TSH: TSH: 1.44 u[IU]/mL (ref 0.35–5.50)

## 2022-02-27 MED ORDER — CETIRIZINE HCL 10 MG PO TABS: ORAL_TABLET | ORAL | 3 refills | Status: DC

## 2022-02-27 MED ORDER — JANUMET XR 50-1000 MG PO TB24: ORAL_TABLET | ORAL | 3 refills | Status: DC

## 2022-02-27 MED ORDER — ATORVASTATIN CALCIUM 40 MG PO TABS: ORAL_TABLET | ORAL | 3 refills | Status: DC

## 2022-02-27 MED ORDER — PROPRANOLOL HCL 20 MG PO TABS: 20.0000 mg | ORAL_TABLET | Freq: Two times a day (BID) | ORAL | 3 refills | Status: DC

## 2022-02-27 MED ORDER — GABAPENTIN 300 MG PO CAPS: ORAL_CAPSULE | ORAL | 3 refills | Status: DC

## 2022-02-27 MED ORDER — LEVOTHYROXINE SODIUM 75 MCG PO TABS: 75.0000 ug | ORAL_TABLET | Freq: Every day | ORAL | 3 refills | Status: DC

## 2022-02-27 MED ORDER — MONTELUKAST SODIUM 10 MG PO TABS: 10.0000 mg | ORAL_TABLET | Freq: Every day | ORAL | 3 refills | Status: DC

## 2022-02-27 MED ORDER — TRAZODONE HCL 100 MG PO TABS: 50.0000 mg | ORAL_TABLET | Freq: Every evening | ORAL | 3 refills | Status: DC | PRN

## 2022-02-27 NOTE — Patient Instructions (Addendum)
Call and schedule mammogram please  02/06/2021 Telephone [facility]   [address]   [place], [place] [address]   [phone]   Darryl Lent, MD   [address]   [place], [place] [address]   [phone] (Work)   [phone] (Fax)    Call when ready for colonoscopy  Dr. Clent Ridges

## 2022-02-27 NOTE — Progress Notes (Addendum)
Chief Complaint  Patient presents with   Follow-up   F/u  1. Dm 2 needs refill all meds on janumet xr 50-500 mg qd lipiotor 40 mg qd  2. Tremor gabapentin 300 mg qhs and propranolol 20 mg bid declines further f/u [facility] neurology Dr. Melrose Nakayama  3. Refill of chronic meds  Hypothyroidism on 75 mcg qd of medication     Review of Systems  Constitutional:  Negative for weight loss.  HENT:  Negative for hearing loss.   Eyes:  Negative for blurred vision.  Respiratory:  Negative for shortness of breath.   Cardiovascular:  Negative for chest pain.  Gastrointestinal:  Negative for abdominal pain and blood in stool.  Genitourinary:  Negative for dysuria.  Musculoskeletal:  Negative for falls and joint pain.  Skin:  Negative for rash.  Neurological:  Negative for headaches.  Psychiatric/Behavioral:  Negative for depression.    Past Medical History:  Diagnosis Date   Allergy    Anxiety    Benign essential tremor    right side   Complication of anesthesia    complications with epidural in 2002   Depression    Fibromyalgia    Herniated lumbar intervertebral disc    History of kidney stones    Hyperlipidemia    Hypertension    history of blood pressure   Hypothyroidism    Ovarian cyst 08/21/2016   One septated cyst, one simple cyst both on left ovary Feb 2018   Psoriasis    Thyroid disease    Type 2 diabetes mellitus ([place]) 11/29/2015   Vitamin D deficiency 11/01/2015   Past Surgical History:  Procedure Laterality Date   BREAST BIOPSY Left 01/19/2021   left breast stereo distortion path pending   BREAST BIOPSY WITH RADIO FREQUENCY LOCALIZER Left 02/14/2021   Procedure: LEFT BREAST EXCISIONAL BIOPSY WITH RADIO FREQUENCY LOCALIZER;  Surgeon: Herbert Pun, MD;  Location: [facility] ORS;  Service: General;  Laterality: Left;   CESAREAN SECTION     2002/2006   scar tissue removed     after 1 st c section   TUBAL LIGATION     Family History  Problem Relation Age of Onset   Cancer  Mother        lung not smoker    Alzheimer's disease Father    Dementia Father    Parkinson's disease Father    Drug abuse Sister    Thyroid disease Sister    Thyroid disease Sister    Cancer Sister        cervical   Asthma Sister    Depression Sister    Hyperlipidemia Sister    Thyroid disease Sister    Depression Sister    Thyroid disease Sister    Depression Sister    Asthma Sister    Arthritis Sister    Kidney disease Sister    Breast cancer Maternal Aunt 40   Cancer Maternal Grandmother    Asthma Maternal Grandmother    Cancer Maternal Grandfather    Asthma Maternal Grandfather    Cancer Paternal Grandmother        ?type   Cancer Paternal Grandfather        ?type   Early death Brother        childbirth   Cancer Other        strong FH breast cancer 6/12 maternal relatives per pt and also colon cnacer   Cancer Other        brain   Social History  Socioeconomic History   Marital status: Single    Spouse name: Not on file   Number of children: 2   Years of education: Not on file   Highest education level: Not on file  Occupational History   Not on file  Tobacco Use   Smoking status: Never   Smokeless tobacco: Never  Vaping Use   Vaping Use: Never used  Substance and Sexual Activity   Alcohol use: No    Alcohol/week: 0.0 standard drinks of alcohol   Drug use: No   Sexual activity: Never  Other Topics Concern   Not on file  Social History Narrative   Disabled    Some college    Owns guns   Wears seat belt, safe in relationship    Single 2 kids    Social Determinants of Health   Financial Resource Strain: Low Risk  (09/05/2021)   Overall Financial Resource Strain (CARDIA)    Difficulty of Paying Living Expenses: Not hard at all  Food Insecurity: No Food Insecurity (09/05/2021)   Hunger Vital Sign    Worried About Running Out of Food in the Last Year: Never true    Ran Out of Food in the Last Year: Never true  Transportation Needs: No  Transportation Needs (09/05/2021)   PRAPARE - Hydrologist (Medical): No    Lack of Transportation (Non-Medical): No  Physical Activity: Unknown (07/21/2020)   Exercise Vital Sign    Days of Exercise per Week: 0 days    Minutes of Exercise per Session: Not on file  Stress: No Stress Concern Present (09/05/2021)   [place]    Feeling of Stress : Not at all  Social Connections: Unknown (09/05/2021)   Social Connection and Isolation Panel [NHANES]    Frequency of Communication with Friends and Family: Never    Frequency of Social Gatherings with Friends and Family: More than three times a week    Attends Religious Services: Not on file    Active Member of Clubs or Organizations: Not on file    Attends Archivist Meetings: Not on file    Marital Status: Not on file  Intimate Partner Violence: Not At Risk (09/05/2021)   Humiliation, Afraid, Rape, and Kick questionnaire    Fear of Current or Ex-Partner: No    Emotionally Abused: No    Physically Abused: No    Sexually Abused: No   Current Meds  Medication Sig   Cholecalciferol 50 MCG (2000 UT) TABS Take 2,000 Units by mouth at bedtime.   Multiple Vitamins-Minerals (MULTIVITAMIN WITH MINERALS) tablet Take 1 tablet by mouth at bedtime. [place]   [DISCONTINUED] atorvastatin (LIPITOR) 40 MG tablet TAKE 1 TABLET BY MOUTH EVERYDAY AT BEDTIME   [DISCONTINUED] cetirizine (ZYRTEC) 10 MG tablet TAKE 1 TABLET BY MOUTH EVERY DAY AS NEEDED FOR ALLERGY   [DISCONTINUED] gabapentin (NEURONTIN) 300 MG capsule TAKE 1 CAPSULE BY MOUTH EVERYDAY AT BEDTIME   [DISCONTINUED] JANUMET XR 50-1000 MG TB24 TAKE 1 TABLET BY MOUTH DAILY. STOP METFORMIN XR 1000 MG TWICE DAILY   [DISCONTINUED] levothyroxine (SYNTHROID) 75 MCG tablet Take 1 tablet (75 mcg total) by mouth daily before breakfast.   [DISCONTINUED] montelukast (SINGULAIR) 10 MG tablet TAKE 1 TABLET (10 MG  TOTAL) BY MOUTH AT BEDTIME. FOR ALLERGIES   [DISCONTINUED] propranolol (INDERAL) 20 MG tablet TAKE 1 TABLET BY MOUTH TWICE A DAY   [DISCONTINUED] traZODone (DESYREL) 100 MG tablet TAKE 0.5-1 TABLETS (  50-100 MG TOTAL) BY MOUTH AT BEDTIME AS NEEDED. FOR SLEEP   Allergies  Allergen Reactions   Lyrica [Pregabalin] Other (See Comments)    Patient reports of facial tingling Patient reports of facial tingling   Cymbalta [Duloxetine Hcl]     HTN, feels nothing doesn't care    Influenza Vaccines Other (See Comments)    Patient states she gets very sick and passes out a few days after the shot.    Strawberry Flavor Itching   Recent Results (from the past 2160 hour(s))  Comprehensive metabolic panel     Status: Abnormal   Collection Time: 02/27/22 11:11 AM  Result Value Ref Range   Sodium 143 135 - 145 mEq/L   Potassium 5.0 3.5 - 5.1 mEq/L   Chloride 106 96 - 112 mEq/L   CO2 27 19 - 32 mEq/L   Glucose, Bld 113 (H) 70 - 99 mg/dL   BUN 9 6 - 23 mg/dL   Creatinine, Ser 1.41 (H) 0.40 - 1.20 mg/dL   Total Bilirubin 0.5 0.2 - 1.2 mg/dL   Alkaline Phosphatase 74 39 - 117 U/L   AST 29 0 - 37 U/L   ALT 30 0 - 35 U/L   Total Protein 7.3 6.0 - 8.3 g/dL   Albumin 4.2 3.5 - 5.2 g/dL   GFR 43.34 (L) >60.00 mL/min    Comment: Calculated using the CKD-EPI Creatinine Equation (2021)   Calcium 10.2 8.4 - 10.5 mg/dL  Lipid panel     Status: None   Collection Time: 02/27/22 11:11 AM  Result Value Ref Range   Cholesterol 143 0 - 200 mg/dL    Comment: ATP III Classification       Desirable:  < 200 mg/dL               Borderline High:  200 - 239 mg/dL          High:  > = 240 mg/dL   Triglycerides 128.0 0.0 - 149.0 mg/dL    Comment: Normal:  <150 mg/dLBorderline High:  150 - 199 mg/dL   HDL 42.50 >39.00 mg/dL   VLDL 25.6 0.0 - 40.0 mg/dL   LDL Cholesterol 75 0 - 99 mg/dL   Total CHOL/HDL Ratio 3     Comment:                Men          Women1/2 Average Risk     3.4          3.3Average Risk          5.0           4.42X Average Risk          9.6          7.13X Average Risk          15.0          11.0                       NonHDL 100.87     Comment: NOTE:  Non-HDL goal should be 30 mg/dL higher than patient's LDL goal (i.e. LDL goal of < 70 mg/dL, would have non-HDL goal of < 100 mg/dL)  CBC with Differential/Platelet     Status: None   Collection Time: 02/27/22 11:11 AM  Result Value Ref Range   WBC 9.8 4.0 - 10.5 K/uL   RBC 4.30 3.87 - 5.11 Mil/uL   Hemoglobin 13.3 12.0 -  15.0 g/dL   HCT 39.9 36.0 - 46.0 %   MCV 92.8 78.0 - 100.0 fl   MCHC 33.3 30.0 - 36.0 g/dL   RDW 12.8 11.5 - 15.5 %   Platelets 316.0 150.0 - 400.0 K/uL   Neutrophils Relative % 66.6 43.0 - 77.0 %   Lymphocytes Relative 24.1 12.0 - 46.0 %   Monocytes Relative 6.3 3.0 - 12.0 %   Eosinophils Relative 2.6 0.0 - 5.0 %   Basophils Relative 0.4 0.0 - 3.0 %   Neutro Abs 6.5 1.4 - 7.7 K/uL   Lymphs Abs 2.4 0.7 - 4.0 K/uL   Monocytes Absolute 0.6 0.1 - 1.0 K/uL   Eosinophils Absolute 0.3 0.0 - 0.7 K/uL   Basophils Absolute 0.0 0.0 - 0.1 K/uL  Hemoglobin A1c     Status: Abnormal   Collection Time: 02/27/22 11:11 AM  Result Value Ref Range   Hgb A1c MFr Bld 6.9 (H) 4.6 - 6.5 %    Comment: Glycemic Control Guidelines for People with Diabetes:Non Diabetic:  <6%Goal of Therapy: <7%Additional Action Suggested:  >8%   TSH     Status: None   Collection Time: 02/27/22 11:11 AM  Result Value Ref Range   TSH 1.44 0.35 - 5.50 uIU/mL  Urinalysis, Routine w reflex microscopic     Status: Abnormal   Collection Time: 02/27/22 11:11 AM  Result Value Ref Range   Color, Urine YELLOW YELLOW   APPearance TURBID (A) CLEAR   Specific Gravity, Urine 1.022 1.001 - 1.035   pH 5.5 5.0 - 8.0   Glucose, UA NEGATIVE NEGATIVE   Bilirubin Urine NEGATIVE NEGATIVE   Ketones, ur TRACE (A) NEGATIVE   Hgb urine dipstick NEGATIVE NEGATIVE   Protein, ur TRACE (A) NEGATIVE   Nitrite NEGATIVE NEGATIVE   Leukocytes,Ua 1+ (A) NEGATIVE   WBC, UA 6-10 (A) 0  - 5 /HPF   RBC / HPF NONE SEEN 0 - 2 /HPF   Squamous Epithelial / LPF 0-5 < OR = 5 /HPF   Bacteria, UA MANY (A) NONE SEEN /HPF   AMORPHOUS SEDIMENT MODERATE (A) NONE OR FEW /HPF   Hyaline Cast 0-5 (A) NONE SEEN /LPF  Microalbumin / creatinine urine ratio     Status: Abnormal   Collection Time: 02/27/22 11:11 AM  Result Value Ref Range   Creatinine, Urine 336 (H) 20 - 275 mg/dL    Comment: Verified by repeat analysis. .    Microalb, Ur 1.9 mg/dL    Comment: Reference Range Not established    Microalb Creat Ratio 6 <30 mcg/mg creat    Comment: . The ADA defines abnormalities in albumin excretion as follows: Marland Kitchen Albuminuria Category        Result (mcg/mg creatinine) . Normal to Mildly increased   <30 Moderately increased         30-299  Severely increased           > OR = 300 . The ADA recommends that at least two of three specimens collected within a 3-6 month period be abnormal before considering a patient to be within a diagnostic category.   Urine Culture     Status: None   Collection Time: 03/18/22  9:43 AM   Specimen: Urine  Result Value Ref Range   MICRO NUMBER: [id number]    SPECIMEN QUALITY: Adequate    Sample Source URINE    STATUS: FINAL    Result: No Growth   Sodium, urine, random     Status: Abnormal  Collection Time: 03/18/22  9:43 AM  Result Value Ref Range   Sodium, Ur 27 (L) 28 - 272 mmol/L  Microalbumin / creatinine urine ratio     Status: None   Collection Time: 03/18/22  9:43 AM  Result Value Ref Range   Creatinine, Urine 71 20 - 275 mg/dL   Microalb, Ur <0.2 mg/dL    Comment: Reference Range Not established    Microalb Creat Ratio NOTE <30 mcg/mg creat    Comment: NOTE: The urine albumin value is less than  0.2 mg/dL therefore we are unable to calculate  excretion and/or creatinine ratio. . The ADA defines abnormalities in albumin excretion as follows: Marland Kitchen Albuminuria Category        Result (mcg/mg creatinine) . Normal to Mildly increased    <30 Moderately increased         30-299  Severely increased           > OR = 300 . The ADA recommends that at least two of three specimens collected within a 3-6 month period be abnormal before considering a patient to be within a diagnostic category.   Basic Metabolic Panel (BMET)     Status: Abnormal   Collection Time: 03/18/22  9:44 AM  Result Value Ref Range   Sodium 140 135 - 145 mEq/L   Potassium 4.4 3.5 - 5.1 mEq/L   Chloride 102 96 - 112 mEq/L   CO2 28 19 - 32 mEq/L   Glucose, Bld 97 70 - 99 mg/dL   BUN 10 6 - 23 mg/dL   Creatinine, Ser 1.31 (H) 0.40 - 1.20 mg/dL   GFR 47.33 (L) >60.00 mL/min    Comment: Calculated using the CKD-EPI Creatinine Equation (2021)   Calcium 9.8 8.4 - 10.5 mg/dL   Objective  Body mass index is 28.24 kg/m. Wt Readings from Last 3 Encounters:  02/27/22 154 lb 6.4 oz (70 kg)  09/05/21 156 lb (70.8 kg)  04/25/21 156 lb (70.8 kg)   Temp Readings from Last 3 Encounters:  02/27/22 98 F (36.7 C) (Oral)  04/25/21 (!) 96 F (35.6 C)  02/14/21 (!) 97.1 F (36.2 C) (Temporal)   BP Readings from Last 3 Encounters:  02/27/22 116/60  04/25/21 138/84  02/14/21 117/76   Pulse Readings from Last 3 Encounters:  02/27/22 65  04/25/21 79  02/14/21 (!) 58    Physical Exam Vitals and nursing note reviewed.  Constitutional:      Appearance: Normal appearance. She is well-developed and well-groomed.  HENT:     Head: Normocephalic and atraumatic.  Eyes:     Conjunctiva/sclera: Conjunctivae normal.     Pupils: Pupils are equal, round, and reactive to light.  Cardiovascular:     Rate and Rhythm: Normal rate and regular rhythm.     Heart sounds: Normal heart sounds. No murmur heard. Pulmonary:     Effort: Pulmonary effort is normal.     Breath sounds: Normal breath sounds.  Abdominal:     General: Abdomen is flat. Bowel sounds are normal.     Tenderness: There is no abdominal tenderness.  Musculoskeletal:        General: No tenderness.   Skin:    General: Skin is warm and dry.  Neurological:     General: No focal deficit present.     Mental Status: She is alert and oriented to person, place, and time. Mental status is at baseline.     Cranial Nerves: Cranial nerves 2-12 are intact.  Motor: Motor function is intact.     Coordination: Coordination is intact.     Gait: Gait is intact.  Psychiatric:        Attention and Perception: Attention and perception normal.        Mood and Affect: Mood and affect normal.        Speech: Speech normal.        Behavior: Behavior normal. Behavior is cooperative.        Thought Content: Thought content normal.        Cognition and Memory: Cognition and memory normal.        Judgment: Judgment normal.     Assessment  Plan  Hypothyroidism, unspecified type - Plan: Comprehensive metabolic panel, CBC with Differential/Platelet, TSH, levothyroxine (SYNTHROID) 75 MCG tablet qd   Hyperlipidemia LDL goal <100 - Plan: Comprehensive metabolic panel, Lipid panel, CBC with Differential/Platelet  Type 2 diabetes mellitus without complication, without long-term current use of insulin (HCC) - Plan: Comprehensive metabolic panel, Lipid panel, CBC with Differential/Platelet, Hemoglobin A1c, Urinalysis, Routine w reflex microscopic, Microalbumin / creatinine urine ratio, SitaGLIPtin-MetFORMIN HCl (JANUMET XR) 50-1000 MG TB24, atorvastatin (LIPITOR) 40 MG tablet Eye exam upcoming 03/2022 AE  Pre-renal AKI with decline in renal function  BL cr was around 1 or less in 2022 now increasing to 1.41 and 1.31 with fena pre-renal etiology she states she reduced tea and trying to drink more water  Checking ana, spep, upep Renal US and consider duplex renal artery [place] r/o RAS in the future  Echo  Referral to renal MD   Insomnia, unspecified type - Plan: traZODone (DESYREL) 100 MG tablet  Allergic rhinitis, unspecified seasonality, unspecified trigger - Plan: montelukast (SINGULAIR) 10 MG tablet, cetirizine  (ZYRTEC) 10 MG tablet  Essential tremor - Plan: propranolol (INDERAL) 20 MG tablet bid, gabapentin (NEURONTIN) 300 MG capsule qhs Fibromyalgia - Plan: gabapentin (NEURONTIN) 300 MG capsule  Hyperlipidemia, unspecified hyperlipidemia type - Plan: atorvastatin (LIPITOR) 40 MG tablet  HM Declines flu shot sick and passed out x 3 times, declines shingrix  Tdap utd  pna 23 vx, prevar in future given DM 2, declines due to flu shot side effect rec MMR vaccine and hep B vaccine moderna 2/2 declines booster   Referred for mammogram prev FH breast and colon cancer pt needs to sch -order in encouraged to schedule  01/15/21 abnormal mammogram bx neg malignancy f/u surgery 01/2021 excisional surgery benign with clip marker and tracer   Colonoscopy referred [facility] 02/23/21 [facility]   pt did not go as of 04/25/21 and 96/23 declines for now will call when ready    Pap 08/15/17 neg pap neg HPV-[facility] saw 09/02/2018 f/u in 1 year   -referred [facility] ob/gyn 11/07/20 neg neg hpv [facility]   Never smoker, no etoh   Eye [place] 08/17/20 neg AE Dr. Neville Route normal neg retinopathy appt 03/2022   Dermatology consider in future as of 08/29/20, 04/25/21, 02/27/22 no need   Neurology Dr. Melrose Nakayama for tremor call if tremor worse    Fatty liver + likely reason elevated lfts  Consider hep A/B vaccines declines for now   Abnormal mammogram of left breast - Plan: MM DIAG BREAST TOMO UNI LEFT, US BREAST LTD UNI LEFT INC AXILLA Due end of 06/2021    H/O breast surgery - Plan: MM DIAG BREAST TOMO UNI LEFT, US BREAST LTD UNI LEFT INC AXILLA Due end 06/2021   Call and sch mammogram  Had initial visit colonoscopy [facility]  call and schedule   Rec healthy diet and exercise    Provider: Dr. Olivia Mackie McLean-Scocuzza-Internal Medicine

## 2022-02-28 LAB — URINALYSIS, ROUTINE W REFLEX MICROSCOPIC
Bilirubin Urine: NEGATIVE
Glucose, UA: NEGATIVE
Hgb urine dipstick: NEGATIVE
Nitrite: NEGATIVE
RBC / HPF: NONE SEEN /HPF (ref 0–2)
Specific Gravity, Urine: 1.022 (ref 1.001–1.035)
pH: 5.5 (ref 5.0–8.0)

## 2022-02-28 LAB — MICROALBUMIN / CREATININE URINE RATIO
Creatinine, Urine: 336 mg/dL — ABNORMAL HIGH (ref 20–275)
Microalb Creat Ratio: 6 mcg/mg creat (ref <30)
Microalb, Ur: 1.9 mg/dL

## 2022-03-04 NOTE — Addendum Note (Signed)
Addended by: Quentin Ore on: 03/04/2022 06:09 PM   Modules accepted: Orders

## 2022-03-18 ENCOUNTER — Other Ambulatory Visit (INDEPENDENT_AMBULATORY_CARE_PROVIDER_SITE_OTHER): Payer: Medicare Other

## 2022-03-18 ENCOUNTER — Other Ambulatory Visit: Payer: Self-pay | Admitting: Internal Medicine

## 2022-03-18 DIAGNOSIS — R7989 Other specified abnormal findings of blood chemistry: Secondary | ICD-10-CM

## 2022-03-18 DIAGNOSIS — N3000 Acute cystitis without hematuria: Secondary | ICD-10-CM | POA: Diagnosis not present

## 2022-03-18 DIAGNOSIS — N179 Acute kidney failure, unspecified: Secondary | ICD-10-CM

## 2022-03-18 LAB — BASIC METABOLIC PANEL
BUN: 10 mg/dL (ref 6–23)
CO2: 28 mEq/L (ref 19–32)
Calcium: 9.8 mg/dL (ref 8.4–10.5)
Chloride: 102 mEq/L (ref 96–112)
Creatinine, Ser: 1.31 mg/dL — ABNORMAL HIGH (ref 0.40–1.20)
GFR: 47.33 mL/min — ABNORMAL LOW (ref >60.00)
Glucose, Bld: 97 mg/dL (ref 70–99)
Potassium: 4.4 mEq/L (ref 3.5–5.1)
Sodium: 140 mEq/L (ref 135–145)

## 2022-03-20 ENCOUNTER — Other Ambulatory Visit: Payer: Self-pay | Admitting: Internal Medicine

## 2022-03-20 DIAGNOSIS — E119 Type 2 diabetes mellitus without complications: Secondary | ICD-10-CM

## 2022-03-20 LAB — URINE CULTURE
MICRO NUMBER:: 13963252
Result:: NO GROWTH
SPECIMEN QUALITY:: ADEQUATE

## 2022-03-20 LAB — MICROALBUMIN / CREATININE URINE RATIO
Creatinine, Urine: 71 mg/dL (ref 20–275)
Microalb, Ur: <0.2 mg/dL

## 2022-03-20 LAB — SODIUM, URINE, RANDOM: Sodium, Ur: 27 mmol/L — ABNORMAL LOW (ref 28–272)

## 2022-03-22 ENCOUNTER — Telehealth: Payer: Self-pay

## 2022-03-22 NOTE — Telephone Encounter (Signed)
LMOM for pt to CB in regards to labs 

## 2022-03-24 DIAGNOSIS — N19 Unspecified kidney failure: Secondary | ICD-10-CM | POA: Insufficient documentation

## 2022-03-24 NOTE — Addendum Note (Signed)
Addended by: Orland Mustard on: 03/24/2022 10:56 PM   Modules accepted: Orders

## 2022-03-28 ENCOUNTER — Ambulatory Visit: Payer: Medicare Other

## 2022-04-03 ENCOUNTER — Other Ambulatory Visit (INDEPENDENT_AMBULATORY_CARE_PROVIDER_SITE_OTHER): Payer: Medicare Other

## 2022-04-03 DIAGNOSIS — N179 Acute kidney failure, unspecified: Secondary | ICD-10-CM | POA: Diagnosis not present

## 2022-04-03 DIAGNOSIS — R7989 Other specified abnormal findings of blood chemistry: Secondary | ICD-10-CM

## 2022-04-05 ENCOUNTER — Ambulatory Visit
Admission: RE | Admit: 2022-04-05 | Discharge: 2022-04-05 | Disposition: A | Discharge status: Home / Self Care | Payer: Medicare Other | Source: Ambulatory Visit | Attending: Internal Medicine | Admitting: Internal Medicine

## 2022-04-05 DIAGNOSIS — Z1231 Encounter for screening mammogram for malignant neoplasm of breast: Secondary | ICD-10-CM | POA: Diagnosis not present

## 2022-04-06 LAB — PROTEIN ELECTROPHORESIS, URINE REFLEX
Albumin ELP, Urine: 100 %
Alpha-1-Globulin, U: 0 %
Alpha-2-Globulin, U: 0 %
Beta Globulin, U: 0 %
Gamma Globulin, U: 0 %
Protein, Ur: 4.4 mg/dL

## 2022-04-08 ENCOUNTER — Other Ambulatory Visit: Payer: Self-pay | Admitting: Family

## 2022-04-08 ENCOUNTER — Ambulatory Visit
Admission: RE | Admit: 2022-04-08 | Discharge: 2022-04-08 | Disposition: A | Discharge status: Home / Self Care | Payer: Medicare Other | Source: Ambulatory Visit | Attending: Internal Medicine | Admitting: Internal Medicine

## 2022-04-08 ENCOUNTER — Ambulatory Visit (HOSPITAL_BASED_OUTPATIENT_CLINIC_OR_DEPARTMENT_OTHER)
Admission: RE | Admit: 2022-04-08 | Discharge: 2022-04-08 | Disposition: A | Discharge status: Home / Self Care | Payer: Medicare Other | Source: Ambulatory Visit | Attending: Internal Medicine | Admitting: Internal Medicine

## 2022-04-08 DIAGNOSIS — E1159 Type 2 diabetes mellitus with other circulatory complications: Secondary | ICD-10-CM | POA: Diagnosis not present

## 2022-04-08 DIAGNOSIS — R5383 Other fatigue: Secondary | ICD-10-CM | POA: Insufficient documentation

## 2022-04-08 DIAGNOSIS — I152 Hypertension secondary to endocrine disorders: Secondary | ICD-10-CM

## 2022-04-08 DIAGNOSIS — R768 Other specified abnormal immunological findings in serum: Secondary | ICD-10-CM

## 2022-04-08 DIAGNOSIS — N19 Unspecified kidney failure: Secondary | ICD-10-CM

## 2022-04-08 DIAGNOSIS — R0609 Other forms of dyspnea: Secondary | ICD-10-CM

## 2022-04-08 LAB — PROTEIN ELECTROPHORESIS, SERUM
Albumin ELP: 3.9 g/dL (ref 3.8–4.8)
Alpha 1: 0.3 g/dL (ref 0.2–0.3)
Alpha 2: 0.8 g/dL (ref 0.5–0.9)
Beta 2: 0.4 g/dL (ref 0.2–0.5)
Beta Globulin: 0.4 g/dL (ref 0.4–0.6)
Gamma Globulin: 0.8 g/dL (ref 0.8–1.7)
Total Protein: 6.7 g/dL (ref 6.1–8.1)

## 2022-04-08 LAB — ECHOCARDIOGRAM COMPLETE
AR max vel: 2.23 cm2
AV Area VTI: 2.3 cm2
AV Area mean vel: 2.14 cm2
AV Mean grad: 2 mmHg
AV Peak grad: 4.3 mmHg
Ao pk vel: 1.04 m/s
Area-P 1/2: 3.34 cm2
S' Lateral: 2.8 cm

## 2022-04-08 LAB — ANTI-NUCLEAR AB-TITER (ANA TITER): ANA Titer 1: 1:40 {titer} — ABNORMAL HIGH

## 2022-04-08 LAB — ANA: Anti Nuclear Antibody (ANA): POSITIVE — AB

## 2022-04-10 ENCOUNTER — Telehealth: Payer: Self-pay | Admitting: Internal Medicine

## 2022-04-10 NOTE — Telephone Encounter (Signed)
Pt called stating she would like to know her mammogram results

## 2022-04-11 NOTE — Telephone Encounter (Signed)
Lvm informing pt that mammo came back normal & to reach out with any questions or concerns.

## 2022-06-03 DIAGNOSIS — N1831 Chronic kidney disease, stage 3a: Secondary | ICD-10-CM | POA: Diagnosis not present

## 2022-06-04 LAB — HM DIABETES EYE EXAM

## 2022-07-15 DIAGNOSIS — E1122 Type 2 diabetes mellitus with diabetic chronic kidney disease: Secondary | ICD-10-CM | POA: Diagnosis not present

## 2022-07-15 DIAGNOSIS — N1831 Chronic kidney disease, stage 3a: Secondary | ICD-10-CM | POA: Diagnosis not present

## 2022-08-27 ENCOUNTER — Ambulatory Visit (INDEPENDENT_AMBULATORY_CARE_PROVIDER_SITE_OTHER): Payer: Medicare Other | Admitting: Family Medicine

## 2022-08-27 ENCOUNTER — Encounter: Payer: Self-pay | Admitting: Family Medicine

## 2022-08-27 VITALS — BP 90/60 | HR 57 | Temp 96.4°F | Ht 62.0 in | Wt 138.6 lb

## 2022-08-27 DIAGNOSIS — E785 Hyperlipidemia, unspecified: Secondary | ICD-10-CM | POA: Diagnosis not present

## 2022-08-27 DIAGNOSIS — E118 Type 2 diabetes mellitus with unspecified complications: Secondary | ICD-10-CM | POA: Diagnosis not present

## 2022-08-27 DIAGNOSIS — E038 Other specified hypothyroidism: Secondary | ICD-10-CM | POA: Diagnosis not present

## 2022-08-27 DIAGNOSIS — G4700 Insomnia, unspecified: Secondary | ICD-10-CM

## 2022-08-27 DIAGNOSIS — G250 Essential tremor: Secondary | ICD-10-CM | POA: Diagnosis not present

## 2022-08-27 DIAGNOSIS — E1159 Type 2 diabetes mellitus with other circulatory complications: Secondary | ICD-10-CM | POA: Diagnosis not present

## 2022-08-27 DIAGNOSIS — R251 Tremor, unspecified: Secondary | ICD-10-CM

## 2022-08-27 DIAGNOSIS — N1831 Chronic kidney disease, stage 3a: Secondary | ICD-10-CM | POA: Diagnosis not present

## 2022-08-27 DIAGNOSIS — I152 Hypertension secondary to endocrine disorders: Secondary | ICD-10-CM

## 2022-08-27 LAB — COMPREHENSIVE METABOLIC PANEL
ALT: 22 U/L (ref 0–35)
AST: 23 U/L (ref 0–37)
Albumin: 4.2 g/dL (ref 3.5–5.2)
Alkaline Phosphatase: 62 U/L (ref 39–117)
BUN: 9 mg/dL (ref 6–23)
CO2: 26 mEq/L (ref 19–32)
Calcium: 10.4 mg/dL (ref 8.4–10.5)
Chloride: 103 mEq/L (ref 96–112)
Creatinine, Ser: 1.35 mg/dL — ABNORMAL HIGH (ref 0.40–1.20)
GFR: 45.51 mL/min — ABNORMAL LOW (ref >60.00)
Glucose, Bld: 89 mg/dL (ref 70–99)
Potassium: 4.8 mEq/L (ref 3.5–5.1)
Sodium: 138 mEq/L (ref 135–145)
Total Bilirubin: 0.5 mg/dL (ref 0.2–1.2)
Total Protein: 7.2 g/dL (ref 6.0–8.3)

## 2022-08-27 LAB — CBC WITH DIFFERENTIAL/PLATELET
Basophils Absolute: 0.1 10*3/uL (ref 0.0–0.1)
Basophils Relative: 0.6 % (ref 0.0–3.0)
Eosinophils Absolute: 0.2 10*3/uL (ref 0.0–0.7)
Eosinophils Relative: 1.6 % (ref 0.0–5.0)
HCT: 44 % (ref 36.0–46.0)
Hemoglobin: 14.9 g/dL (ref 12.0–15.0)
Lymphocytes Relative: 21.1 % (ref 12.0–46.0)
Lymphs Abs: 2.1 10*3/uL (ref 0.7–4.0)
MCHC: 33.8 g/dL (ref 30.0–36.0)
MCV: 91.3 fl (ref 78.0–100.0)
Monocytes Absolute: 0.5 10*3/uL (ref 0.1–1.0)
Monocytes Relative: 5.4 % (ref 3.0–12.0)
Neutro Abs: 7 10*3/uL (ref 1.4–7.7)
Neutrophils Relative %: 71.3 % (ref 43.0–77.0)
Platelets: 386 10*3/uL (ref 150.0–400.0)
RBC: 4.82 Mil/uL (ref 3.87–5.11)
RDW: 12.8 % (ref 11.5–15.5)
WBC: 9.9 10*3/uL (ref 4.0–10.5)

## 2022-08-27 LAB — LIPID PANEL
Cholesterol: 127 mg/dL (ref 0–200)
HDL: 51.9 mg/dL
LDL Cholesterol: 54 mg/dL (ref 0–99)
NonHDL: 74.76
Total CHOL/HDL Ratio: 2
Triglycerides: 102 mg/dL (ref 0.0–149.0)
VLDL: 20.4 mg/dL (ref 0.0–40.0)

## 2022-08-27 LAB — TSH: TSH: 0.49 u[IU]/mL (ref 0.35–5.50)

## 2022-08-27 LAB — VITAMIN B12: Vitamin B-12: 219 pg/mL (ref 211–911)

## 2022-08-27 LAB — HEMOGLOBIN A1C: Hgb A1c MFr Bld: 6 % (ref 4.6–6.5)

## 2022-08-27 MED ORDER — PROPRANOLOL HCL 10 MG PO TABS: 10.0000 mg | ORAL_TABLET | Freq: Two times a day (BID) | ORAL | 0 refills | Status: DC

## 2022-08-27 MED ORDER — TRAZODONE HCL 50 MG PO TABS: 25.0000 mg | ORAL_TABLET | Freq: Every evening | ORAL | 0 refills | Status: DC | PRN

## 2022-08-27 NOTE — Progress Notes (Unsigned)
SUBJECTIVE:   Chief Complaint  Patient presents with   Transitions Of Care   HPI Presents to clinic to transfer care  No acute concerns today  DM Type 2 Asymptomatic.  Compliant with current medications.  Takes Janumet 50-1000 mg daily.  Has changed diet and lost weight. On statin therapy.  Not on ACEi/ARB  CKD Stage 3a Changed diet to avoid increasing kidney failure. Farxiga 10 mg recently added by Nephrologist. Reports tolerating medication well.  Blood pressure well controlled  Hypothyroid Asymptomatic.  Tolerating Levothyroxine 75 mcg daily and takes 30 mins prior to am meal.  Essential Tremor Controlled with Gabapentin 300 mg at night and Propranolol 20 mg twice a day.  Tolerating well.    HTN Asymptomatic.  Takes Propranolol 20 mg BID. Soft blood pressure and bradycardic.  Not currently on ACEi/ARB.    PERTINENT PMH / PSH: HTN DM Type 2 CKD Stage 3a Essential Tremor   OBJECTIVE:  BP 90/60   Pulse (!) 57   Temp (!) 96.4 F (35.8 C) (Oral)   Ht 5\' 2"  (1.575 m)   Wt 138 lb 9.6 oz (62.9 kg)   LMP 01/07/2018   SpO2 96%   BMI 25.35 kg/m    Physical Exam Vitals reviewed.  Constitutional:      General: She is not in acute distress.    Appearance: She is normal weight. She is not ill-appearing.  HENT:     Head: Normocephalic.  Eyes:     Conjunctiva/sclera: Conjunctivae normal.  Neck:     Thyroid: No thyromegaly or thyroid tenderness.  Cardiovascular:     Rate and Rhythm: Normal rate and regular rhythm.     Pulses: Normal pulses.  Pulmonary:     Effort: Pulmonary effort is normal.     Breath sounds: Normal breath sounds.  Abdominal:     General: Bowel sounds are normal.  Neurological:     Mental Status: She is alert. Mental status is at baseline.  Psychiatric:        Mood and Affect: Mood normal.        Behavior: Behavior normal.        Thought Content: Thought content normal.        Judgment: Judgment normal.     ASSESSMENT/PLAN:  Type 2  diabetes mellitus with complications (HCC) Assessment & Plan: Chronic. Asymptomatic Recently started on Farxiga for CKD Continue Janumet 50-1000 mg daily Not on ACEi/ARB On statin therapy BP well controlled but would consider low dose ARB in future A1c today Recommend Diabetic Eye exam. Due 04/2023 Foot exam at next visit      Hypertension associated with diabetes [facility]) Assessment & Plan: Chronic. Soft BP today.  With weight loss will need to lower medication Decrease Propranolol 10 mg BID Monitor BP at home, goal less than <130/80.  Adjusting sedative medication at night Follow up next week in RN clinic for BP check  Orders: -     Hemoglobin A1c -     CBC with Differential/Platelet -     Comprehensive metabolic panel -     Lipid panel -     Vitamin B12  Stage 3a chronic kidney disease (CKD) (HCC) Assessment & Plan: Chronic. Recently started on Farxiga 10 mg daily Follows with Nephrology at [facility]    Other specified hypothyroidism Assessment & Plan: Chronic.Asymptomatic.   Continue Levothyroxine 75 mcg daily Check TSH    Orders: -     TSH  Essential tremor Assessment & Plan: Chronic.  Continue Gabapentin 300 mg at night Decrease Propranolol 10 mg BID for soft BP and bradycardia  Orders: -     Propranolol HCl; Take 1 tablet (10 mg total) by mouth 2 (two) times daily.  Dispense: 60 tablet; Refill: 0  Insomnia, unspecified type Assessment & Plan: Chronic. Sleeps well.  On trazodone 100 mg at night,   Decrease Trazodone to 25 mg nightly with recent weight loss likely does not require high dose.  Can increase to 50 mg at night if needed   Orders: -     traZODone HCl; Take 0.5-1 tablets (25-50 mg total) by mouth at bedtime as needed. for sleep  Dispense: 30 tablet; Refill: 0  Hyperlipidemia LDL goal <100 Assessment & Plan: Chronic. On statin and tolerating well. No myalgias Continue Atorvastatin 40 mg daily Check fasting  lipids   HCM Colonoscopy, declined Cologuard, declined Mammogram due 03/2023 Annual Medicare Wellness due Declined Shingles Declined Flu Recommend Pneumonia 20   PDMP reviewed  Return in about 6 months (around 02/27/2023) for PCP, diabetes management.  Carollee Leitz, MD

## 2022-08-27 NOTE — Patient Instructions (Addendum)
It was a pleasure meeting you today. Thank you for allowing me to take part in your health care.  Our goals for today as we discussed include:  Decrease Trazadone to 25 mg at night.  Can take an extra tablet if not effective.  Max dose 50 mg at night  Decrease Propranolol to 10 mg two times a day  We will get some labs today.  If they are abnormal or we need to do something about them, I will call you.  If they are normal, I will send you a message on MyChart (if it is active) or a letter in the mail.  If you don't hear from [place] in 2 weeks, please call the office at the number below.   You are due for an eye exam.  Please make sure that you call your eye doctor to have this scheduled and have them fax the results to our office.   Colonoscopy is due  Diabetic foot exam due at next visit   If you have any questions or concerns, please do not hesitate to call the office at [phone].  I look forward to our next visit and until then take care and stay safe.  Regards,   Carollee Leitz, MD   [facility]

## 2022-08-28 ENCOUNTER — Telehealth: Payer: Self-pay

## 2022-08-28 ENCOUNTER — Encounter: Payer: Medicare Other | Admitting: Family Medicine

## 2022-08-28 ENCOUNTER — Telehealth: Payer: Self-pay | Admitting: Family Medicine

## 2022-08-28 NOTE — Telephone Encounter (Signed)
Patient called back about message below:   Gordy Councilman, [facility] 08/28/2022  8:51 AM EST Back to Top    LVM for patient to call back for results. Lyman Speller, MD 08/27/2022  5:13 PM EST     A1c 6. Down from last check. Continue current medication   Follow up with Nephrology for kidneys     All other blood work acceptable   I let her know of providers message and she said she already has a follow up next month with her nephrologist

## 2022-08-28 NOTE — Telephone Encounter (Signed)
Patient was asked at her visit if she has had a eye exam in 2023 and she stated she had but she could not remember the providers name, she said it was in [place] and I suggested [employer] are and she stated yes I sent them a letter and they faxed a letter back stating that she was not a patient of Ellin Mayhew,.  Kashmir Lysaght,cma

## 2022-08-30 ENCOUNTER — Encounter: Payer: Self-pay | Admitting: Family Medicine

## 2022-08-30 DIAGNOSIS — N1831 Chronic kidney disease, stage 3a: Secondary | ICD-10-CM | POA: Insufficient documentation

## 2022-08-30 NOTE — Assessment & Plan Note (Signed)
Chronic.Asymptomatic.   Continue Levothyroxine 75 mcg daily Check TSH

## 2022-08-30 NOTE — Assessment & Plan Note (Signed)
Chronic. On statin and tolerating well. No myalgias Continue Atorvastatin 40 mg daily Check fasting lipids

## 2022-08-30 NOTE — Assessment & Plan Note (Signed)
Chronic. Continue Gabapentin 300 mg at night Decrease Propranolol 10 mg BID for soft BP and bradycardia

## 2022-08-30 NOTE — Assessment & Plan Note (Signed)
Chronic. Recently started on Farxiga 10 mg daily Follows with Nephrology at [facility]

## 2022-08-30 NOTE — Assessment & Plan Note (Signed)
Chronic. Sleeps well.  On trazodone 100 mg at night,   Decrease Trazodone to 25 mg nightly with recent weight loss likely does not require high dose.  Can increase to 50 mg at night if needed

## 2022-08-30 NOTE — Assessment & Plan Note (Addendum)
Chronic. Asymptomatic Recently started on Farxiga for CKD Continue Janumet 50-1000 mg daily Not on ACEi/ARB On statin therapy BP well controlled but would consider low dose ARB in future A1c today Recommend Diabetic Eye exam. Due 04/2023 Foot exam at next visit

## 2022-08-30 NOTE — Assessment & Plan Note (Signed)
Chronic. Soft BP today.  With weight loss will need to lower medication Decrease Propranolol 10 mg BID Monitor BP at home, goal less than <130/80.  Adjusting sedative medication at night Follow up next week in RN clinic for BP check

## 2022-09-16 ENCOUNTER — Ambulatory Visit (INDEPENDENT_AMBULATORY_CARE_PROVIDER_SITE_OTHER): Payer: Medicare Other

## 2022-09-16 VITALS — BP 108/71 | HR 69

## 2022-09-16 DIAGNOSIS — E1159 Type 2 diabetes mellitus with other circulatory complications: Secondary | ICD-10-CM | POA: Diagnosis not present

## 2022-09-16 DIAGNOSIS — I152 Hypertension secondary to endocrine disorders: Secondary | ICD-10-CM

## 2022-09-16 NOTE — Progress Notes (Signed)
Patient here for nurse visit BP check per order from Dr. Volanda Napoleon.   Patient reports compliance with prescribed BP medications: yes  Last dose of BP medication:  This morning  BP Readings from Last 3 Encounters:  08/27/22 90/60  02/27/22 116/60  04/25/21 138/84   Pulse Readings from Last 3 Encounters:  08/27/22 (!) 57  02/27/22 65  04/25/21 79    Per Margaret Arnette, BP and pulse are good, Patient has not dizziness or lightheadedness and patient was informed to continue current regimen and patient was informed to schedule a visit with provider to discuss further.  Samia Kukla,cma   Patient verbalized understanding of instructions.   Gordy Councilman, CMA

## 2022-09-17 ENCOUNTER — Ambulatory Visit (INDEPENDENT_AMBULATORY_CARE_PROVIDER_SITE_OTHER): Payer: Medicare Other | Admitting: *Deleted

## 2022-09-17 ENCOUNTER — Encounter: Payer: Self-pay | Admitting: *Deleted

## 2022-09-17 VITALS — BP 90/60 | Ht 62.0 in | Wt 138.6 lb

## 2022-09-17 DIAGNOSIS — Z0000 Encounter for general adult medical examination without abnormal findings: Secondary | ICD-10-CM

## 2022-09-17 NOTE — Progress Notes (Signed)
I connected with  Tracy Stephens on 09/17/22 by a audio enabled telemedicine application and verified that I am speaking with the correct person using two identifiers.  Patient Location: Home  Provider Location: Office/Clinic  I discussed the limitations of evaluation and management by telemedicine. The patient expressed understanding and agreed to proceed.   Subjective:   Tracy Stephens is a 52 y.o. female who presents for Medicare Annual (Subsequent) preventive examination.   Cardiac Risk Factors include: none     Objective:    Today's Vitals   09/17/22 1606  BP: 90/60  Weight: 138 lb 9.6 oz (62.9 kg)  Height: 5\' 2"  (1.575 m)   Body mass index is 25.35 kg/m.     09/17/2022    4:21 PM 09/05/2021    3:04 PM 02/14/2021    6:20 AM 02/09/2021   10:34 AM 07/21/2020   12:41 PM 11/11/2016    9:44 AM 08/12/2016    8:54 AM  Advanced Directives  Does Patient Have a Medical Advance Directive? No No No No No No No  Does patient want to make changes to medical advance directive?     No - Patient declined    Would patient like information on creating a medical advance directive? No - Patient declined No - Patient declined No - Patient declined        Current Medications (verified) Outpatient Encounter Medications as of 09/17/2022  Medication Sig   atorvastatin (LIPITOR) 40 MG tablet TAKE 1 TABLET BY MOUTH EVERYDAY AT BEDTIME   Cholecalciferol 50 MCG (2000 UT) TABS Take 2,000 Units by mouth at bedtime.   FARXIGA 10 MG TABS tablet Take 10 mg by mouth every morning.   gabapentin (NEURONTIN) 300 MG capsule TAKE 1 CAPSULE BY MOUTH EVERYDAY AT BEDTIME   levothyroxine (SYNTHROID) 75 MCG tablet Take 1 tablet (75 mcg total) by mouth daily before breakfast.   montelukast (SINGULAIR) 10 MG tablet Take 1 tablet (10 mg total) by mouth at bedtime. For allergies   Multiple Vitamins-Minerals (MULTIVITAMIN WITH MINERALS) tablet Take 1 tablet by mouth at bedtime. [place]   propranolol (INDERAL)  10 MG tablet Take 1 tablet (10 mg total) by mouth 2 (two) times daily.   SitaGLIPtin-MetFORMIN HCl (JANUMET XR) 50-1000 MG TB24 TAKE 1 TABLET BY MOUTH DAILY. STOP METFORMIN XR 1000 MG TWICE DAILY   traZODone (DESYREL) 50 MG tablet Take 0.5-1 tablets (25-50 mg total) by mouth at bedtime as needed. for sleep   albuterol (VENTOLIN HFA) 108 (90 Base) MCG/ACT inhaler  (Patient not taking: Reported on 09/17/2022)   alclomethasone (ACLOVATE) 0.05 % cream Apply topically 2 (two) times daily. Prn face as needed (Patient not taking: Reported on 09/17/2022)   EPINEPHrine 0.3 mg/0.3 mL IJ SOAJ injection  (Patient not taking: Reported on 09/17/2022)   No facility-administered encounter medications on file as of 09/17/2022.    Allergies (verified) Lyrica [pregabalin], Cymbalta [duloxetine hcl], Influenza vaccines, and Strawberry flavor   History: Past Medical History:  Diagnosis Date   Allergic conjunctivitis 02/04/2018   Allergy    Annual physical exam 04/25/2021   Anxiety    Anxiety and depression 03/31/2019   Benign essential tremor    right side   Bilateral hand pain [id number]   Complication of anesthesia    complications with epidural in 2002   Depression    Early satiety 08/21/2016   Encounter for medication monitoring 02/12/2016   Fibromyalgia    Herniated lumbar intervertebral disc    History of kidney stones  Hyperlipidemia    Hypertension    history of blood pressure   Hypothyroidism    Irregular menses 05/09/2016   Need for immunization against influenza 04/20/2015   Other chronic pain 07/08/2018   Ovarian cyst 08/21/2016   One septated cyst, one simple cyst both on left ovary Feb 2018   Preventative health care 08/15/2017   Primary writing tremor 03/04/2016   Psoriasis    Shoulder pain, right 05/06/2018   Thyroid disease    Tremor of right hand 05/11/2018   Type 2 diabetes mellitus ([place]) 11/29/2015   Vitamin D deficiency 11/01/2015   Weakness of both hands 11/12/2018    Past Surgical History:  Procedure Laterality Date   BREAST BIOPSY Left 01/19/2021   left breast stereo distortion benign excision done   BREAST BIOPSY WITH RADIO FREQUENCY LOCALIZER Left 02/14/2021   Procedure: LEFT BREAST EXCISIONAL BIOPSY WITH RADIO FREQUENCY LOCALIZER;  Surgeon: Herbert Pun, MD;  Location: [facility] ORS;  Service: General;  Laterality: Left;   CESAREAN SECTION     2002/2006   scar tissue removed     after 1 st c section   TUBAL LIGATION     Family History  Problem Relation Age of Onset   Cancer Mother        lung not smoker    Alzheimer's disease Father    Dementia Father    Parkinson's disease Father    Drug abuse Sister    Thyroid disease Sister    Thyroid disease Sister    Cancer Sister        cervical   Asthma Sister    Depression Sister    Hyperlipidemia Sister    Thyroid disease Sister    Depression Sister    Thyroid disease Sister    Depression Sister    Asthma Sister    Arthritis Sister    Kidney disease Sister    Breast cancer Maternal Aunt 40   Cancer Maternal Grandmother    Asthma Maternal Grandmother    Cancer Maternal Grandfather    Asthma Maternal Grandfather    Cancer Paternal Grandmother        ?type   Cancer Paternal Grandfather        ?type   Early death Brother        childbirth   Cancer Other        strong FH breast cancer 6/12 maternal relatives per pt and also colon cnacer   Cancer Other        brain   Social History   Socioeconomic History   Marital status: Single    Spouse name: Not on file   Number of children: 2   Years of education: Not on file   Highest education level: Not on file  Occupational History   Not on file  Tobacco Use   Smoking status: Never   Smokeless tobacco: Never  Vaping Use   Vaping Use: Never used  Substance and Sexual Activity   Alcohol use: No    Alcohol/week: 0.0 standard drinks of alcohol   Drug use: No   Sexual activity: Never  Other Topics Concern   Not on file   Social History Narrative   Disabled    Some college    Owns guns   Wears seat belt, safe in relationship    Single 2 kids    Social Determinants of Health   Financial Resource Strain: Medium Risk (09/17/2022)   Overall Financial Resource Strain (CARDIA)    Difficulty of  Paying Living Expenses: Somewhat hard  Food Insecurity: Food Insecurity Present (09/17/2022)   Hunger Vital Sign    Worried About Running Out of Food in the Last Year: Sometimes true    Ran Out of Food in the Last Year: Sometimes true  Transportation Needs: No Transportation Needs (09/17/2022)   PRAPARE - Hydrologist (Medical): No    Lack of Transportation (Non-Medical): No  Physical Activity: Inactive (09/17/2022)   Exercise Vital Sign    Days of Exercise per Week: 0 days    Minutes of Exercise per Session: 0 min  Stress: No Stress Concern Present (09/17/2022)   [place]    Feeling of Stress : Not at all  Social Connections: Socially Isolated (09/17/2022)   Social Connection and Isolation Panel [NHANES]    Frequency of Communication with Friends and Family: Never    Frequency of Social Gatherings with Friends and Family: Never    Attends Religious Services: Never    Marine scientist or Organizations: No    Attends Music therapist: Never    Marital Status: Never married    Tobacco Counseling Counseling given: Not Answered   Clinical Intake:  Pre-visit preparation completed: Yes  Pain : No/denies pain     BMI - recorded: 25.34 Nutritional Status: BMI 25 -29 Overweight Diabetes: Yes CBG done?: No Did pt. bring in CBG monitor from home?: No  How often do you need to have someone help you when you read instructions, pamphlets, or other written materials from your doctor or pharmacy?: 1 - Never  Diabetic? Yes Interpreter Needed?: No      Activities of Daily Living    09/17/2022     4:13 PM  In your present state of health, do you have any difficulty performing the following activities:  Hearing? 1  Comment deaf in one ear  Vision? 0  Comment wears glasses  Difficulty concentrating or making decisions? 0  Walking or climbing stairs? 0  Dressing or bathing? 0  Doing errands, shopping? 0  Preparing Food and eating ? N  Using the Toilet? N  In the past six months, have you accidently leaked urine? N  Do you have problems with loss of bowel control? N  Managing your Medications? N  Managing your Finances? N  Housekeeping or managing your Housekeeping? N    Patient Care Team: Carollee Leitz, MD as PCP - General (Family Medicine)  Indicate any recent Medical Services you may have received from other than [facility] providers in the past year (date may be approximate).     Assessment:   This is a routine wellness examination for Tracy Stephens.  Hearing/Vision screen No results found.  Dietary issues and exercise activities discussed: Current Exercise Habits: The patient does not participate in regular exercise at present   Goals Addressed   None    Depression Screen    09/17/2022    4:12 PM 08/27/2022   10:02 AM 02/27/2022   10:05 AM 09/05/2021    3:03 PM 04/25/2021   11:28 AM 07/21/2020   12:42 PM 01/07/2020    9:44 AM  PHQ 2/9 Scores  PHQ - 2 Score 0 0 1 0 2 0 1  PHQ- 9 Score       4    Fall Risk    09/17/2022    4:12 PM 08/27/2022   10:01 AM 02/27/2022   10:05 AM 09/05/2021    3:06  PM 04/25/2021   11:28 AM  Fall Risk   Falls in the past year? 0 0 1 0 0  Number falls in past yr: 0 0 1  0  Injury with Fall? 0 0 0 0 0  Risk for fall due to :  No Fall Risks Other (Comment)    Follow up  Falls evaluation completed Falls evaluation completed Falls evaluation completed Falls evaluation completed    [place]:  Any stairs in or around the home? Yes  If so, are there any without handrails? No  Home free of loose throw rugs in  walkways, pet beds, electrical cords, etc? Yes  Adequate lighting in your home to reduce risk of falls? Yes   ASSISTIVE DEVICES UTILIZED TO PREVENT FALLS:  Life alert? No  Use of a cane, walker or w/c? No  Grab bars in the bathroom? No  Shower chair or bench in shower? No  Elevated toilet seat or a handicapped toilet? No     Cognitive Function:        09/17/2022    4:19 PM 09/05/2021    4:00 PM 07/21/2020   12:59 PM  6CIT Screen  What Year? 0 points 0 points 0 points  What month? 0 points 0 points 0 points  What time? 0 points 0 points 0 points  Count back from 20 0 points 0 points 0 points  Months in reverse 0 points 0 points 0 points  Repeat phrase 2 points 0 points   Total Score 2 points 0 points     Immunizations Immunization History  Administered Date(s) Administered   Influenza,inj,Quad PF,6+ Mos 04/20/2015, 05/23/2017   Influenza-Unspecified 04/02/2014, 04/20/2015   Moderna Sars-Covid-2 Vaccination 01/24/2020, 02/25/2020   Tdap 05/06/2018    TDAP status: Up to date  Flu Vaccine status: Declined, Education has been provided regarding the importance of this vaccine but patient still declined. Advised may receive this vaccine at local pharmacy or Health Dept. Aware to provide a copy of the vaccination record if obtained from local pharmacy or Health Dept. Verbalized acceptance and understanding.    Covid-19 vaccine status: Information provided on how to obtain vaccines.   Qualifies for Shingles Vaccine? No   Zostavax completed No   Shingrix Completed?: No.    Education has been provided regarding the importance of this vaccine. Patient has been advised to call insurance company to determine out of pocket expense if they have not yet received this vaccine. Advised may also receive vaccine at local pharmacy or Health Dept. Verbalized acceptance and understanding.  Screening Tests Health Maintenance  Topic Date Due   COLONOSCOPY (Pts 45-47yrs Insurance coverage  will need to be confirmed)  Never done   OPHTHALMOLOGY EXAM  08/17/2021   HEMOGLOBIN A1C  02/27/2023   FOOT EXAM  02/28/2023   Diabetic kidney evaluation - Urine ACR  03/19/2023   MAMMOGRAM  04/06/2023   Diabetic kidney evaluation - eGFR measurement  08/27/2023   Medicare Annual Wellness (AWV)  09/17/2023   PAP SMEAR-Modifier  11/08/2023   DTaP/Tdap/Td (2 - Td or Tdap) 05/06/2028   Hepatitis C Screening  Completed   HIV Screening  Completed   HPV VACCINES  Aged Out   INFLUENZA VACCINE  Discontinued   COVID-19 Vaccine  Discontinued   Zoster Vaccines- Shingrix  Discontinued    Health Maintenance  Health Maintenance Due  Topic Date Due   COLONOSCOPY (Pts 45-4yrs Insurance coverage will need to be confirmed)  Never done  OPHTHALMOLOGY EXAM  08/17/2021    Colonoscopy never performed  Mammogram status: Completed 04/05/2022. Repeat every year    Lung Cancer Screening: (Low Dose CT Chest recommended if Age 45-80 years, 30 pack-year currently smoking OR have quit w/in 15years.) does not qualify.    Additional Screening:  Hepatitis C Screening: does qualify; Completed 01/14/2020  Vision Screening: Recommended annual ophthalmology exams for early detection of glaucoma and other disorders of the eye. Is the patient up to date with their annual eye exam?  Yes  Who is the provider or what is the name of the office in which the patient attends annual eye exams? [place] If pt is not established with a provider, would they like to be referred to a provider to establish care? No .   Dental Screening: Recommended annual dental exams for proper oral hygiene  Community Resource Referral / Chronic Care Management: CRR required this visit?  Yes   CCM required this visit?  No      Plan:     I have personally reviewed and noted the following in the patient's chart:   Medical and social history Use of alcohol, tobacco or illicit drugs  Current medications and supplements  including opioid prescriptions. Patient is not currently taking opioid prescriptions. Functional ability and status Nutritional status Physical activity Advanced directives List of other physicians Hospitalizations, surgeries, and ER visits in previous 12 months Vitals Screenings to include cognitive, depression, and falls Referrals and appointments  In addition, I have reviewed and discussed with patient certain preventive protocols, quality metrics, and best practice recommendations. A written personalized care plan for preventive services as well as general preventive health recommendations were provided to patient.     Cannon Kettle, [place]   09/17/2022   Nurse Notes: Total time spent on telephone visit today was 15 minutes.

## 2022-09-17 NOTE — Patient Instructions (Signed)
  Ms. Wakeham , Thank you for taking time to come for your Medicare Wellness Visit. I appreciate your ongoing commitment to your health goals. Please review the following plan we discussed and let me know if I can assist you in the future.   These are the goals we discussed:  Goals       I would like to lose a little weight (pt-stated)      Healthy diet Stay active        This is a list of the screening recommended for you and due dates:  Health Maintenance  Topic Date Due   Colon Cancer Screening  Never done   Eye exam for diabetics  08/17/2021   Hemoglobin A1C  02/27/2023   Complete foot exam   02/28/2023   Yearly kidney health urinalysis for diabetes  03/19/2023   Mammogram  04/06/2023   Yearly kidney function blood test for diabetes  08/27/2023   Medicare Annual Wellness Visit  09/17/2023   Pap Smear  11/08/2023   DTaP/Tdap/Td vaccine (2 - Td or Tdap) 05/06/2028   Hepatitis C Screening: USPSTF Recommendation to screen - Ages 73-79 yo.  Completed   HIV Screening  Completed   HPV Vaccine  Aged Out   Flu Shot  Discontinued   COVID-19 Vaccine  Discontinued   Zoster (Shingles) Vaccine  Discontinued

## 2022-09-18 ENCOUNTER — Telehealth: Payer: Self-pay

## 2022-09-18 NOTE — Telephone Encounter (Signed)
   Telephone encounter was:  Successful.  09/18/2022 Name: Tracy Stephens MRN: [id number] DOB: 01-19-1971  Tracy Stephens is a 52 y.o. year old female who is a primary care patient of Carollee Leitz, MD . The community resource team was consulted for assistance with  F inancial strain  Care guide performed the following interventions: Patient provided with information about care guide support team and interviewed to confirm resource needs.Patient has stage 3 kidney failure and is having financial strain trying to pay bills and buy foods she needs as well as for her children. I have put in referrals for [facility] for food as well as mailed resources to the patient.  Follow Up Plan:  No further follow up planned at this time. The patient has been provided with needed resources.    [facility] [phone] [address], [place], [place] [address] Phone: [phone] Email: [email or web address]

## 2022-09-24 ENCOUNTER — Other Ambulatory Visit: Payer: Self-pay | Admitting: Family Medicine

## 2022-09-24 DIAGNOSIS — G250 Essential tremor: Secondary | ICD-10-CM

## 2022-09-25 NOTE — Telephone Encounter (Signed)
Pt called in to check the status of med refill. Note below from provider was read to her. As per pt, she already check her bp on 09/16/22, and has an appt again on 10/21/22 to check her bp.

## 2022-10-21 ENCOUNTER — Ambulatory Visit: Payer: Medicare Other

## 2022-10-21 NOTE — Progress Notes (Signed)
Patient arrived for a blood pressure check. Blood Pressure:  113/78  Pulse:  66  SpO2:  96%    Weight:135.2

## 2022-10-30 ENCOUNTER — Telehealth: Payer: Self-pay | Admitting: Family Medicine

## 2022-10-30 DIAGNOSIS — G4700 Insomnia, unspecified: Secondary | ICD-10-CM

## 2022-10-30 MED ORDER — TRAZODONE HCL 50 MG PO TABS: 25.0000 mg | ORAL_TABLET | Freq: Every evening | ORAL | 0 refills | Status: DC | PRN

## 2022-10-30 NOTE — Telephone Encounter (Signed)
Prescription Request  10/30/2022  LOV: 08/27/2022  What is the name of the medication or equipment? traZODone (DESYREL) 50 MG tablet. Patient is out of medication, she contacted her pharmacy on Friday and still waiting for a reply from office.   Have you contacted your pharmacy to request a refill? Yes   Which pharmacy would you like this sent to?  [id number] [place], [place] - [address] [place] [address] Phone: [phone] Fax: [phone]    Patient notified that their request is being sent to the clinical staff for review and that they should receive a response within 2 business days.   Please advise at [facility] [phone]

## 2022-10-30 NOTE — Telephone Encounter (Signed)
No refill request from pharmacy in chart.  Rx sent to pharmacy.

## 2022-11-12 DIAGNOSIS — N1831 Chronic kidney disease, stage 3a: Secondary | ICD-10-CM | POA: Diagnosis not present

## 2022-11-12 DIAGNOSIS — E785 Hyperlipidemia, unspecified: Secondary | ICD-10-CM | POA: Diagnosis not present

## 2022-11-12 DIAGNOSIS — E1122 Type 2 diabetes mellitus with diabetic chronic kidney disease: Secondary | ICD-10-CM | POA: Diagnosis not present

## 2022-11-12 DIAGNOSIS — G250 Essential tremor: Secondary | ICD-10-CM | POA: Diagnosis not present

## 2022-11-12 DIAGNOSIS — I10 Essential (primary) hypertension: Secondary | ICD-10-CM | POA: Diagnosis not present

## 2022-12-03 DIAGNOSIS — S92425A Nondisplaced fracture of distal phalanx of left great toe, initial encounter for closed fracture: Secondary | ICD-10-CM | POA: Diagnosis not present

## 2022-12-03 DIAGNOSIS — S99922A Unspecified injury of left foot, initial encounter: Secondary | ICD-10-CM | POA: Diagnosis not present

## 2022-12-21 ENCOUNTER — Other Ambulatory Visit: Payer: Self-pay | Admitting: Family Medicine

## 2022-12-21 DIAGNOSIS — G4700 Insomnia, unspecified: Secondary | ICD-10-CM

## 2022-12-23 ENCOUNTER — Other Ambulatory Visit: Payer: Self-pay | Admitting: Family Medicine

## 2023-01-15 ENCOUNTER — Other Ambulatory Visit: Payer: Self-pay | Admitting: Family Medicine

## 2023-01-15 DIAGNOSIS — G4700 Insomnia, unspecified: Secondary | ICD-10-CM

## 2023-01-27 ENCOUNTER — Telehealth: Payer: Self-pay | Admitting: Family Medicine

## 2023-01-27 ENCOUNTER — Other Ambulatory Visit: Payer: Self-pay

## 2023-01-27 DIAGNOSIS — G250 Essential tremor: Secondary | ICD-10-CM

## 2023-01-27 MED ORDER — PROPRANOLOL HCL 10 MG PO TABS: 10.0000 mg | ORAL_TABLET | Freq: Two times a day (BID) | ORAL | 3 refills | Status: DC

## 2023-01-27 NOTE — Telephone Encounter (Signed)
Rx sent to pharmacy   

## 2023-01-27 NOTE — Telephone Encounter (Signed)
Prescription Request  01/27/2023  LOV: 08/27/2022  What is the name of the medication or equipment? propranolol (INDERAL) 10 MG tablet  Have you contacted your pharmacy to request a refill? Yes   Which pharmacy would you like this sent to?   [id number] [place], [place] - [address] [place] [address] Phone: [phone] Fax: [phone]    Patient notified that their request is being sent to the clinical staff for review and that they should receive a response within 2 business days.   Please advise at Mobile [phone] (mobile)

## 2023-02-27 ENCOUNTER — Ambulatory Visit (INDEPENDENT_AMBULATORY_CARE_PROVIDER_SITE_OTHER): Payer: Medicare Other | Admitting: Family Medicine

## 2023-02-27 ENCOUNTER — Encounter: Payer: Self-pay | Admitting: Family Medicine

## 2023-02-27 VITALS — BP 110/78 | HR 55 | Temp 97.9°F | Ht 62.0 in | Wt 124.4 lb

## 2023-02-27 DIAGNOSIS — M797 Fibromyalgia: Secondary | ICD-10-CM

## 2023-02-27 DIAGNOSIS — J309 Allergic rhinitis, unspecified: Secondary | ICD-10-CM | POA: Diagnosis not present

## 2023-02-27 DIAGNOSIS — E118 Type 2 diabetes mellitus with unspecified complications: Secondary | ICD-10-CM | POA: Diagnosis not present

## 2023-02-27 DIAGNOSIS — N1831 Chronic kidney disease, stage 3a: Secondary | ICD-10-CM

## 2023-02-27 DIAGNOSIS — E039 Hypothyroidism, unspecified: Secondary | ICD-10-CM

## 2023-02-27 DIAGNOSIS — G4700 Insomnia, unspecified: Secondary | ICD-10-CM | POA: Diagnosis not present

## 2023-02-27 DIAGNOSIS — E785 Hyperlipidemia, unspecified: Secondary | ICD-10-CM

## 2023-02-27 DIAGNOSIS — E1169 Type 2 diabetes mellitus with other specified complication: Secondary | ICD-10-CM | POA: Diagnosis not present

## 2023-02-27 DIAGNOSIS — Z7984 Long term (current) use of oral hypoglycemic drugs: Secondary | ICD-10-CM

## 2023-02-27 DIAGNOSIS — I152 Hypertension secondary to endocrine disorders: Secondary | ICD-10-CM | POA: Diagnosis not present

## 2023-02-27 DIAGNOSIS — E1159 Type 2 diabetes mellitus with other circulatory complications: Secondary | ICD-10-CM

## 2023-02-27 DIAGNOSIS — G250 Essential tremor: Secondary | ICD-10-CM

## 2023-02-27 LAB — COMPREHENSIVE METABOLIC PANEL
ALT: 20 U/L (ref 0–35)
AST: 22 U/L (ref 0–37)
Albumin: 4.1 g/dL (ref 3.5–5.2)
Alkaline Phosphatase: 74 U/L (ref 39–117)
BUN: 9 mg/dL (ref 6–23)
CO2: 29 meq/L (ref 19–32)
Calcium: 10 mg/dL (ref 8.4–10.5)
Chloride: 104 meq/L (ref 96–112)
Creatinine, Ser: 1.16 mg/dL (ref 0.40–1.20)
GFR: 54.4 mL/min — ABNORMAL LOW (ref >60.00)
Glucose, Bld: 85 mg/dL (ref 70–99)
Potassium: 4.5 meq/L (ref 3.5–5.1)
Sodium: 138 meq/L (ref 135–145)
Total Bilirubin: 0.6 mg/dL (ref 0.2–1.2)
Total Protein: 7 g/dL (ref 6.0–8.3)

## 2023-02-27 LAB — MICROALBUMIN / CREATININE URINE RATIO
Creatinine,U: 49.2 mg/dL
Microalb Creat Ratio: 1.4 mg/g (ref 0.0–30.0)
Microalb, Ur: <0.7 mg/dL (ref 0.0–1.9)

## 2023-02-27 LAB — TSH: TSH: 0.25 u[IU]/mL — ABNORMAL LOW (ref 0.35–5.50)

## 2023-02-27 LAB — HEMOGLOBIN A1C: Hgb A1c MFr Bld: 5.6 % (ref 4.6–6.5)

## 2023-02-27 LAB — LIPID PANEL
Cholesterol: 132 mg/dL (ref 0–200)
HDL: 51.4 mg/dL (ref >39.00)
LDL Cholesterol: 61 mg/dL (ref 0–99)
NonHDL: 80.14
Total CHOL/HDL Ratio: 3
Triglycerides: 97 mg/dL (ref 0.0–149.0)
VLDL: 19.4 mg/dL (ref 0.0–40.0)

## 2023-02-27 MED ORDER — PROPRANOLOL HCL 10 MG PO TABS: 10.0000 mg | ORAL_TABLET | Freq: Two times a day (BID) | ORAL | 3 refills | Status: DC

## 2023-02-27 MED ORDER — JANUMET XR 50-1000 MG PO TB24: ORAL_TABLET | ORAL | 3 refills | Status: DC

## 2023-02-27 MED ORDER — ATORVASTATIN CALCIUM 40 MG PO TABS: ORAL_TABLET | ORAL | 3 refills | Status: DC

## 2023-02-27 MED ORDER — GABAPENTIN 300 MG PO CAPS: ORAL_CAPSULE | ORAL | 3 refills | Status: DC

## 2023-02-27 MED ORDER — LEVOTHYROXINE SODIUM 75 MCG PO TABS: 75.0000 ug | ORAL_TABLET | Freq: Every day | ORAL | 3 refills | Status: DC

## 2023-02-27 MED ORDER — MONTELUKAST SODIUM 10 MG PO TABS: 10.0000 mg | ORAL_TABLET | Freq: Every day | ORAL | 3 refills | Status: DC

## 2023-02-27 MED ORDER — TRAZODONE HCL 50 MG PO TABS: 25.0000 mg | ORAL_TABLET | Freq: Every evening | ORAL | 2 refills | Status: DC | PRN

## 2023-02-27 MED ORDER — FARXIGA 10 MG PO TABS: 10.0000 mg | ORAL_TABLET | Freq: Every morning | ORAL | 3 refills | Status: DC

## 2023-02-27 NOTE — Assessment & Plan Note (Signed)
Refill Gabapentin 300 mg at night Encouraged adequate sleep, strength building, resistance training

## 2023-02-27 NOTE — Patient Instructions (Signed)
It was a pleasure meeting you today. Thank you for allowing me to take part in your health care.  Our goals for today as we discussed include:  We will get some labs today.  If they are abnormal or we need to do something about them, I will call you.  If they are normal, I will send you a message on MyChart (if it is active) or a letter in the mail.  If you don't hear from [place] in 2 weeks, please call the office at the number below.   Refills sent for requested medications  Recommend colonoscopy or cologuard for colon cancer screening  Follow up in 6 months  If you have any questions or concerns, please do not hesitate to call the office at [phone].  I look forward to our next visit and until then take care and stay safe.  Regards,   Dana Allan, MD   [facility]

## 2023-02-27 NOTE — Assessment & Plan Note (Signed)
Chronic. Recently started on Farxiga 10 mg daily Follows with Nephrology at [facility] today

## 2023-02-27 NOTE — Assessment & Plan Note (Signed)
Chronic. Refill Gabapentin 300 mg at night Refill Propranolol 10 mg BID

## 2023-02-27 NOTE — Assessment & Plan Note (Signed)
Chronic. On statin and tolerating well. No myalgias Refill Atorvastatin 40 mg daily Lipids today

## 2023-02-27 NOTE — Progress Notes (Signed)
SUBJECTIVE:   Chief Complaint  Patient presents with   Medical Management of Chronic Issues   HPI Presents to clinic for follow up chronic disease management  No acute concerns today  DM Type 2 Requesting refill on medications.  Asymptomatic.  Compliant with current medications.  Takes Janumet 50-1000 mg daily.  Has changed diet and lost weight. Weight down 10 lbs since last visit.  On statin therapy.  Not on ACEi/ARB.    CKD Stage 3a Eats little protein, mostly fruits and vegetables.  Weight loss now 30 lbs since 02/2022 Farxiga 10 mg recently added by Nephrologist. Reports tolerating medication well.  Blood pressure well controlled.  Requesting refill  Hypothyroid Increasing weight loss. Has changed diet to avoid worsening kidney. Taking Levothyroxine 75 mcg daily.  Will check TSH to ensure levels remain wnl given weight loss  Essential Tremor Controlled with Gabapentin 300 mg at night and Propranolol 10 mg twice a day.  Requesting refill.  HTN Well controlled on current medication.  Takes Propranolol 20 mg BID. Soft blood pressure and bradycardic.  Not currently on ACEi/ARB.  Requesting refill for medication.   PERTINENT PMH / PSH: HTN DM Type 2 CKD Stage 3a Essential Tremor   OBJECTIVE:  BP 110/78   Pulse (!) 55   Temp 97.9 F (36.6 C) (Oral)   Ht 5\' 2"  (1.575 m)   Wt 124 lb 6.4 oz (56.4 kg)   LMP 01/07/2018   SpO2 98%   BMI 22.75 kg/m    Physical Exam Vitals reviewed.  Constitutional:      General: She is not in acute distress.    Appearance: She is normal weight. She is not ill-appearing.  HENT:     Head: Normocephalic.  Eyes:     Conjunctiva/sclera: Conjunctivae normal.  Neck:     Thyroid: No thyromegaly or thyroid tenderness.  Cardiovascular:     Rate and Rhythm: Normal rate and regular rhythm.     Pulses: Normal pulses.  Pulmonary:     Effort: Pulmonary effort is normal.     Breath sounds: Normal breath sounds.  Abdominal:     General:  Bowel sounds are normal.  Neurological:     Mental Status: She is alert. Mental status is at baseline.  Psychiatric:        Mood and Affect: Mood normal.        Behavior: Behavior normal.        Thought Content: Thought content normal.        Judgment: Judgment normal.     ASSESSMENT/PLAN:  Type 2 diabetes mellitus with complications (HCC) Assessment & Plan: Chronic. Asymptomatic Refill Farxiga for CKD Refill Janumet 50-1000 mg daily Not on ACEi/ARB On statin therapy BP well controlled but would consider low dose ARB in future Check A1c       Orders: -     Hemoglobin A1c -     Atorvastatin Calcium; TAKE 1 TABLET BY MOUTH EVERYDAY AT BEDTIME  Dispense: 90 tablet; Refill: 3 -     Janumet XR; TAKE 1 TABLET BY MOUTH DAILY. STOP METFORMIN XR 1000 MG TWICE DAILY  Dispense: 90 tablet; Refill: 3 -     Microalbumin / creatinine urine ratio  Hypertension associated with diabetes (HCC) Assessment & Plan: Chronic. Asymptomatic.   Refill Propranolol 10 mg BID Monitor BP at home, goal less than <130/80.    Orders: -     Comprehensive metabolic panel  Hyperlipidemia, unspecified hyperlipidemia type Assessment & Plan: Chronic. On statin  and tolerating well. No myalgias Refill Atorvastatin 40 mg daily Lipids today   Orders: -     Atorvastatin Calcium; TAKE 1 TABLET BY MOUTH EVERYDAY AT BEDTIME  Dispense: 90 tablet; Refill: 3  Essential tremor Assessment & Plan: Chronic. Refill Gabapentin 300 mg at night Refill Propranolol 10 mg BID  Orders: -     Gabapentin; TAKE 1 CAPSULE BY MOUTH EVERYDAY AT BEDTIME  Dispense: 90 capsule; Refill: 3 -     Propranolol HCl; Take 1 tablet (10 mg total) by mouth 2 (two) times daily.  Dispense: 60 tablet; Refill: 3  Fibromyalgia Assessment & Plan: Refill Gabapentin 300 mg at night Encouraged adequate sleep, strength building, resistance training   Orders: -     Gabapentin; TAKE 1 CAPSULE BY MOUTH EVERYDAY AT BEDTIME  Dispense: 90  capsule; Refill: 3  Hypothyroidism, unspecified type Assessment & Plan: Chronic.Asymptomatic.   Increased weight loss Check TSH Refill Levothyroxine 75 mcg daily May need to adjust dose pending level    Orders: -     Levothyroxine Sodium; Take 1 tablet (75 mcg total) by mouth daily before breakfast.  Dispense: 90 tablet; Refill: 3 -     TSH  Allergic rhinitis, unspecified seasonality, unspecified trigger -     Montelukast Sodium; Take 1 tablet (10 mg total) by mouth at bedtime. For allergies  Dispense: 90 tablet; Refill: 3  Insomnia, unspecified type Assessment & Plan: Chronic. Doing well on Trazodone 25 mg at night Refill Trazodone to 25 mg nightly   Orders: -     traZODone HCl; Take 0.5-1 tablets (25-50 mg total) by mouth at bedtime as needed. for sleep  Dispense: 90 tablet; Refill: 2  Stage 3a chronic kidney disease (CKD) (HCC) Assessment & Plan: Chronic. Recently started on Farxiga 10 mg daily Follows with Nephrology at [facility] today  Orders: -     Marcelline Deist; Take 1 tablet (10 mg total) by mouth every morning.  Dispense: 90 tablet; Refill: 3  Hyperlipidemia associated with type 2 diabetes mellitus (HCC) -     Lipid panel   HCM Colonoscopy, declined Cologuard, declined Mammogram due 03/2023 Annual Medicare Wellness due Declined Shingles Declined Flu Recommend Pneumonia 20   PDMP reviewed  Return in about 6 months (around 08/27/2023) for PCP.  Dana Allan, MD

## 2023-02-27 NOTE — Assessment & Plan Note (Signed)
Chronic. Doing well on Trazodone 25 mg at night Refill Trazodone to 25 mg nightly

## 2023-02-27 NOTE — Assessment & Plan Note (Signed)
Chronic. Asymptomatic Refill Farxiga for CKD Refill Janumet 50-1000 mg daily Not on ACEi/ARB On statin therapy BP well controlled but would consider low dose ARB in future Check A1c

## 2023-02-27 NOTE — Assessment & Plan Note (Signed)
Chronic.Asymptomatic.   Increased weight loss Check TSH Refill Levothyroxine 75 mcg daily May need to adjust dose pending level

## 2023-02-27 NOTE — Assessment & Plan Note (Signed)
Chronic. Asymptomatic.   Refill Propranolol 10 mg BID Monitor BP at home, goal less than <130/80.

## 2023-02-28 ENCOUNTER — Other Ambulatory Visit: Payer: Self-pay | Admitting: Family Medicine

## 2023-02-28 DIAGNOSIS — E039 Hypothyroidism, unspecified: Secondary | ICD-10-CM

## 2023-02-28 MED ORDER — LEVOTHYROXINE SODIUM 25 MCG PO TABS: 25.0000 ug | ORAL_TABLET | Freq: Every day | ORAL | 0 refills | Status: DC

## 2023-03-03 ENCOUNTER — Telehealth: Payer: Self-pay

## 2023-03-03 NOTE — Telephone Encounter (Signed)
Patient states she spoke with our office last week and we were planning to decrease her cholesterol medication and take her off her diabetes medication.  Patient states the person she spoke with was going to check with Dr. Dana Allan and get back with her, but she hasn't heard from [place].  Please call.

## 2023-03-06 ENCOUNTER — Other Ambulatory Visit: Payer: Self-pay | Admitting: Family Medicine

## 2023-03-06 DIAGNOSIS — E785 Hyperlipidemia, unspecified: Secondary | ICD-10-CM

## 2023-03-06 DIAGNOSIS — E118 Type 2 diabetes mellitus with unspecified complications: Secondary | ICD-10-CM

## 2023-03-06 MED ORDER — ATORVASTATIN CALCIUM 20 MG PO TABS: 20.0000 mg | ORAL_TABLET | Freq: Every day | ORAL | 3 refills | Status: DC

## 2023-03-06 MED ORDER — JANUMET 50-500 MG PO TABS: 1.0000 | ORAL_TABLET | Freq: Two times a day (BID) | ORAL | 3 refills | Status: DC

## 2023-03-07 NOTE — Telephone Encounter (Signed)
Called pt and she is aware of medication changes.

## 2023-03-18 ENCOUNTER — Telehealth: Payer: Self-pay | Admitting: Family Medicine

## 2023-03-18 DIAGNOSIS — G250 Essential tremor: Secondary | ICD-10-CM | POA: Diagnosis not present

## 2023-03-18 DIAGNOSIS — E1122 Type 2 diabetes mellitus with diabetic chronic kidney disease: Secondary | ICD-10-CM | POA: Diagnosis not present

## 2023-03-18 DIAGNOSIS — I10 Essential (primary) hypertension: Secondary | ICD-10-CM | POA: Diagnosis not present

## 2023-03-18 DIAGNOSIS — N1831 Chronic kidney disease, stage 3a: Secondary | ICD-10-CM | POA: Diagnosis not present

## 2023-03-18 DIAGNOSIS — E785 Hyperlipidemia, unspecified: Secondary | ICD-10-CM | POA: Diagnosis not present

## 2023-03-18 NOTE — Telephone Encounter (Signed)
Pt called stating she just left her kidney doctor and her BP was 84/67. Sent to access nurse

## 2023-03-19 NOTE — Telephone Encounter (Signed)
Pt was sent to access nurse and they advised her to follow up with PCP.

## 2023-04-14 ENCOUNTER — Other Ambulatory Visit: Payer: Medicare Other

## 2023-04-23 ENCOUNTER — Other Ambulatory Visit (INDEPENDENT_AMBULATORY_CARE_PROVIDER_SITE_OTHER): Payer: Medicare Other

## 2023-04-23 DIAGNOSIS — E039 Hypothyroidism, unspecified: Secondary | ICD-10-CM

## 2023-04-23 LAB — TSH: TSH: 78.3 u[IU]/mL — ABNORMAL HIGH (ref 0.35–5.50)

## 2023-04-27 ENCOUNTER — Other Ambulatory Visit: Payer: Self-pay | Admitting: Family Medicine

## 2023-04-27 DIAGNOSIS — E039 Hypothyroidism, unspecified: Secondary | ICD-10-CM

## 2023-04-27 MED ORDER — LEVOTHYROXINE SODIUM 50 MCG PO TABS: 50.0000 ug | ORAL_TABLET | Freq: Every day | ORAL | 0 refills | Status: DC

## 2023-04-28 ENCOUNTER — Telehealth: Payer: Self-pay

## 2023-04-28 NOTE — Telephone Encounter (Signed)
-----   Message from Dana Allan sent at 04/27/2023  6:53 PM EST ----- Thyroid level increased. Increase Levothyroxine to 50 mcg daily and then repeat labs in 6 weeks.  Please schedule lab appointment.  Prescription sent and labs ordered.

## 2023-04-28 NOTE — Telephone Encounter (Signed)
Lvm for pt to give office a call back in regards to labs  see message below

## 2023-05-01 DIAGNOSIS — S93602A Unspecified sprain of left foot, initial encounter: Secondary | ICD-10-CM | POA: Diagnosis not present

## 2023-05-01 DIAGNOSIS — M79672 Pain in left foot: Secondary | ICD-10-CM | POA: Diagnosis not present

## 2023-05-26 ENCOUNTER — Other Ambulatory Visit: Payer: Self-pay | Admitting: Family Medicine

## 2023-05-26 DIAGNOSIS — G250 Essential tremor: Secondary | ICD-10-CM

## 2023-05-26 DIAGNOSIS — E039 Hypothyroidism, unspecified: Secondary | ICD-10-CM

## 2023-06-20 ENCOUNTER — Other Ambulatory Visit: Payer: Self-pay | Admitting: Family Medicine

## 2023-06-20 DIAGNOSIS — E039 Hypothyroidism, unspecified: Secondary | ICD-10-CM

## 2023-09-24 ENCOUNTER — Ambulatory Visit (INDEPENDENT_AMBULATORY_CARE_PROVIDER_SITE_OTHER): Payer: Medicare Other | Admitting: *Deleted

## 2023-09-24 VITALS — Ht 62.0 in | Wt 140.0 lb

## 2023-09-24 DIAGNOSIS — Z0000 Encounter for general adult medical examination without abnormal findings: Secondary | ICD-10-CM | POA: Diagnosis not present

## 2023-09-24 NOTE — Patient Instructions (Signed)
 Tracy Stephens , Thank you for taking time to come for your Medicare Wellness Visit. I appreciate your ongoing commitment to your health goals. Please review the following plan we discussed and let me know if I can assist you in the future.   Referrals/Orders/Follow-Ups/Clinician Recommendations: You need to call and schedule a diabetic eye exam last one on record was 04/05/22.  Consider having a mammogram and colonoscopy.   This is a list of the screening recommended for you and due dates:  Health Maintenance  Topic Date Due   Colon Cancer Screening  Never done   Mammogram  04/06/2023   Eye exam for diabetics  06/05/2023   Hemoglobin A1C  08/27/2023   Pap with HPV screening  11/08/2023   Flu Shot  01/23/2024   Yearly kidney function blood test for diabetes  02/27/2024   Yearly kidney health urinalysis for diabetes  02/27/2024   Complete foot exam   02/27/2024   Medicare Annual Wellness Visit  09/23/2024   DTaP/Tdap/Td vaccine (2 - Td or Tdap) 05/06/2028   Hepatitis C Screening  Completed   HIV Screening  Completed   HPV Vaccine  Aged Out   Pneumococcal Vaccination  Discontinued   COVID-19 Vaccine  Discontinued   Zoster (Shingles) Vaccine  Discontinued    Advanced directives: (Declined) Advance directive discussed with you today. Even though you declined this today, please call our office should you change your mind, and we can give you the proper paperwork for you to fill out.  Next Medicare Annual Wellness Visit scheduled for next year: Yes 09/29/2024 @ 9:30

## 2023-09-24 NOTE — Progress Notes (Signed)
 Subjective:   Tracy Stephens is a 53 y.o. who presents for a Medicare Wellness preventive visit.  Visit Complete: Virtual I connected with  Tracy Stephens on 09/24/23 by a audio enabled telemedicine application and verified that I am speaking with the correct person using two identifiers.  Patient Location: Home  Provider Location: Office/Clinic  I discussed the limitations of evaluation and management by telemedicine. The patient expressed understanding and agreed to proceed.  Vital Signs: Because this visit was a virtual/telehealth visit, some criteria may be missing or patient reported. Any vitals not documented were not able to be obtained and vitals that have been documented are patient reported.  VideoDeclined- This patient declined Librarian, academic. Therefore the visit was completed with audio only.  Persons Participating in Visit: Patient.  AWV Questionnaire: No: Patient Medicare AWV questionnaire was not completed prior to this visit.  Cardiac Risk Factors include: diabetes mellitus;dyslipidemia;hypertension     Objective:    Today's Vitals   09/24/23 1349  Weight: 140 lb (63.5 kg)  Height: 5\' 2"  (1.575 m)   Body mass index is 25.61 kg/m.     09/24/2023    2:02 PM 09/17/2022    4:21 PM 09/05/2021    3:04 PM 02/14/2021    6:20 AM 02/09/2021   10:34 AM 07/21/2020   12:41 PM 11/11/2016    9:44 AM  Advanced Directives  Does Patient Have a Medical Advance Directive? No No No No No No No  Does patient want to make changes to medical advance directive?      No - Patient declined   Would patient like information on creating a medical advance directive? No - Patient declined No - Patient declined No - Patient declined No - Patient declined       Current Medications (verified) Outpatient Encounter Medications as of 09/24/2023  Medication Sig   atorvastatin (LIPITOR) 20 MG tablet Take 1 tablet (20 mg total) by mouth daily.   Cholecalciferol  50 MCG (2000 UT) TABS Take 2,000 Units by mouth at bedtime.   FARXIGA 10 MG TABS tablet Take 1 tablet (10 mg total) by mouth every morning.   gabapentin (NEURONTIN) 300 MG capsule TAKE 1 CAPSULE BY MOUTH EVERYDAY AT BEDTIME   levothyroxine (SYNTHROID) 50 MCG tablet TAKE 1 TABLET BY MOUTH EVERY DAY   montelukast (SINGULAIR) 10 MG tablet Take 1 tablet (10 mg total) by mouth at bedtime. For allergies   Multiple Vitamins-Minerals (MULTIVITAMIN WITH MINERALS) tablet Take 1 tablet by mouth at bedtime. [place]   propranolol (INDERAL) 10 MG tablet TAKE 1 TABLET BY MOUTH TWICE A DAY   sitaGLIPtin-metformin (JANUMET) 50-500 MG tablet Take 1 tablet by mouth 2 (two) times daily with a meal.   SitaGLIPtin-MetFORMIN HCl (JANUMET XR) 50-1000 MG TB24 TAKE 1 TABLET BY MOUTH DAILY. STOP METFORMIN XR 1000 MG TWICE DAILY   traZODone (DESYREL) 50 MG tablet Take 0.5-1 tablets (25-50 mg total) by mouth at bedtime as needed. for sleep   albuterol (VENTOLIN HFA) 108 (90 Base) MCG/ACT inhaler  (Patient not taking: Reported on 09/24/2023)   EPINEPHrine 0.3 mg/0.3 mL IJ SOAJ injection  (Patient not taking: Reported on 09/24/2023)   No facility-administered encounter medications on file as of 09/24/2023.    Allergies (verified) Lyrica [pregabalin], Cymbalta [duloxetine hcl], Influenza vaccines, and Strawberry flavoring agent (non-screening)   History: Past Medical History:  Diagnosis Date   Allergic conjunctivitis 02/04/2018   Allergy    Annual physical exam 04/25/2021   Anxiety  Anxiety and depression 03/31/2019   Benign essential tremor    right side   Bilateral hand pain 08/11/2018   Complication of anesthesia    complications with epidural in 2002   Depression    Early satiety 08/21/2016   Encounter for medication monitoring 02/12/2016   Fibromyalgia    Herniated lumbar intervertebral disc    History of kidney stones    Hyperlipidemia    Hypertension    history of blood pressure   Hypothyroidism     Irregular menses 05/09/2016   Need for immunization against influenza 04/20/2015   Other chronic pain 07/08/2018   Ovarian cyst 08/21/2016   One septated cyst, one simple cyst both on left ovary Feb 2018   Preventative health care 08/15/2017   Primary writing tremor 03/04/2016   Psoriasis    Shoulder pain, right 05/06/2018   Thyroid disease    Tremor of right hand 05/11/2018   Type 2 diabetes mellitus (HCC) 11/29/2015   Vitamin D deficiency 11/01/2015   Weakness of both hands 11/12/2018   Past Surgical History:  Procedure Laterality Date   BREAST BIOPSY Left 01/19/2021   left breast stereo distortion benign excision done   BREAST BIOPSY WITH RADIO FREQUENCY LOCALIZER Left 02/14/2021   Procedure: LEFT BREAST EXCISIONAL BIOPSY WITH RADIO FREQUENCY LOCALIZER;  Surgeon: Carolan Shiver, MD;  Location: [facility] ORS;  Service: General;  Laterality: Left;   CESAREAN SECTION     2002/2006   scar tissue removed     after 1 st c section   TUBAL LIGATION     Family History  Problem Relation Age of Onset   Cancer Mother        lung not smoker    Alzheimer's disease Father    Dementia Father    Parkinson's disease Father    Drug abuse Sister    Thyroid disease Sister    Thyroid disease Sister    Cancer Sister        cervical   Asthma Sister    Depression Sister    Hyperlipidemia Sister    Thyroid disease Sister    Depression Sister    Thyroid disease Sister    Depression Sister    Asthma Sister    Arthritis Sister    Kidney disease Sister    Breast cancer Maternal Aunt 40   Cancer Maternal Grandmother    Asthma Maternal Grandmother    Cancer Maternal Grandfather    Asthma Maternal Grandfather    Cancer Paternal Grandmother        ?type   Cancer Paternal Grandfather        ?type   Early death Brother        childbirth   Cancer Other        strong FH breast cancer 6/12 maternal relatives per pt and also colon cnacer   Cancer Other        brain   Social History    Socioeconomic History   Marital status: Single    Spouse name: Not on file   Number of children: 2   Years of education: Not on file   Highest education level: Not on file  Occupational History   Not on file  Tobacco Use   Smoking status: Never   Smokeless tobacco: Never  Vaping Use   Vaping status: Never Used  Substance and Sexual Activity   Alcohol use: No    Alcohol/week: 0.0 standard drinks of alcohol   Drug use: No   Sexual  activity: Never  Other Topics Concern   Not on file  Social History Narrative   Disabled    Some college    Owns guns   Wears seat belt, safe in relationship    Single 2 kids    Social Drivers of Health   Financial Resource Strain: Low Risk  (09/24/2023)   Overall Financial Resource Strain (CARDIA)    Difficulty of Paying Living Expenses: Not hard at all  Food Insecurity: No Food Insecurity (09/24/2023)   Hunger Vital Sign    Worried About Running Out of Food in the Last Year: Never true    Ran Out of Food in the Last Year: Never true  Transportation Needs: No Transportation Needs (09/24/2023)   PRAPARE - Administrator, Civil Service (Medical): No    Lack of Transportation (Non-Medical): No  Physical Activity: Sufficiently Active (09/24/2023)   Exercise Vital Sign    Days of Exercise per Week: 6 days    Minutes of Exercise per Session: 30 min  Stress: Stress Concern Present (09/24/2023)   [employer] of Occupational Health - Occupational Stress Questionnaire    Feeling of Stress : To some extent  Social Connections: Socially Isolated (09/24/2023)   Social Connection and Isolation Panel [NHANES]    Frequency of Communication with Friends and Family: More than three times a week    Frequency of Social Gatherings with Friends and Family: Twice a week    Attends Religious Services: Never    Database administrator or Organizations: No    Attends Engineer, structural: Never    Marital Status: Divorced    Tobacco  Counseling Counseling given: Not Answered    Clinical Intake:  Pre-visit preparation completed: Yes  Pain : No/denies pain     BMI - recorded: 25.61 Nutritional Status: BMI 25 -29 Overweight Nutritional Risks: None Diabetes: Yes CBG done?: No Did pt. bring in CBG monitor from home?: No  Lab Results  Component Value Date   HGBA1C 5.6 02/27/2023   HGBA1C 6.0 08/27/2022   HGBA1C 6.9 (H) 02/27/2022     How often do you need to have someone help you when you read instructions, pamphlets, or other written materials from your doctor or pharmacy?: 1 - Never  Interpreter Needed?: No  Information entered by :: R. Sherrie Marsan LPN   Activities of Daily Living     09/24/2023    1:51 PM  In your present state of health, do you have any difficulty performing the following activities:  Hearing? 1  Comment wears aids  Vision? 0  Comment glasses  Difficulty concentrating or making decisions? 0  Walking or climbing stairs? 0  Dressing or bathing? 0  Doing errands, shopping? 1  Preparing Food and eating ? N  Using the Toilet? N  In the past six months, have you accidently leaked urine? N  Do you have problems with loss of bowel control? N  Managing your Medications? Y  Managing your Finances? N  Housekeeping or managing your Housekeeping? N    Patient Care Team: Dana Allan, MD as PCP - General (Family Medicine)  Indicate any recent Medical Services you may have received from other than [facility] providers in the past year (date may be approximate).     Assessment:   This is a routine wellness examination for [place].  Hearing/Vision screen Hearing Screening - Comments:: Wears aids Vision Screening - Comments:: glasses   Goals Addressed  This Visit's Progress    Patient Stated       Wants to work on getting her kidneys back to normal       Depression Screen     09/24/2023    1:56 PM 02/27/2023   11:29 AM 09/17/2022    4:12 PM 08/27/2022   10:02 AM 02/27/2022    10:05 AM 09/05/2021    3:03 PM 04/25/2021   11:28 AM  PHQ 2/9 Scores  PHQ - 2 Score 2 2 0 0 1 0 2  PHQ- 9 Score 7 8         Fall Risk     09/24/2023    1:53 PM 02/27/2023   11:28 AM 09/17/2022    4:12 PM 08/27/2022   10:01 AM 02/27/2022   10:05 AM  Fall Risk   Falls in the past year? 0 0 0 0 1  Number falls in past yr: 0 0 0 0 1  Injury with Fall? 0 0 0 0 0  Risk for fall due to : No Fall Risks No Fall Risks  No Fall Risks Other (Comment)  Follow up Falls prevention discussed;Falls evaluation completed Falls evaluation completed  Falls evaluation completed Falls evaluation completed    MEDICARE RISK AT HOME:  Medicare Risk at Home Any stairs in or around the home?: Yes If so, are there any without handrails?: No Home free of loose throw rugs in walkways, pet beds, electrical cords, etc?: Yes Adequate lighting in your home to reduce risk of falls?: Yes Life alert?: No Use of a cane, walker or w/c?: No Grab bars in the bathroom?: No Shower chair or bench in shower?: No Elevated toilet seat or a handicapped toilet?: No  TIMED UP AND GO:  Was the test performed?  No  Cognitive Function: 6CIT completed        09/24/2023    2:02 PM 09/17/2022    4:19 PM 09/05/2021    4:00 PM 07/21/2020   12:59 PM  6CIT Screen  What Year? 0 points 0 points 0 points 0 points  What month? 0 points 0 points 0 points 0 points  What time? 0 points 0 points 0 points 0 points  Count back from 20 0 points 0 points 0 points 0 points  Months in reverse 0 points 0 points 0 points 0 points  Repeat phrase 4 points 2 points 0 points   Total Score 4 points 2 points 0 points     Immunizations Immunization History  Administered Date(s) Administered   Influenza,inj,Quad PF,6+ Mos 04/20/2015, 05/23/2017   Influenza-Unspecified 04/02/2014, 04/20/2015   Moderna Sars-Covid-2 Vaccination 01/24/2020, 02/25/2020   Tdap 05/06/2018    Screening Tests Health Maintenance  Topic Date Due   Colonoscopy  Never done    MAMMOGRAM  04/06/2023   OPHTHALMOLOGY EXAM  06/05/2023   Medicare Annual Wellness (AWV)  09/17/2023   HEMOGLOBIN A1C  08/27/2023   Cervical Cancer Screening (HPV/Pap Cotest)  11/08/2023   INFLUENZA VACCINE  01/23/2024   Diabetic kidney evaluation - eGFR measurement  02/27/2024   Diabetic kidney evaluation - Urine ACR  02/27/2024   FOOT EXAM  02/27/2024   DTaP/Tdap/Td (2 - Td or Tdap) 05/06/2028   Hepatitis C Screening  Completed   HIV Screening  Completed   HPV VACCINES  Aged Out   Pneumococcal Vaccine 2-56 Years old  Discontinued   COVID-19 Vaccine  Discontinued   Zoster Vaccines- Shingrix  Discontinued    Health Maintenance  Health Maintenance Due  Topic Date Due   Colonoscopy  Never done   MAMMOGRAM  04/06/2023   OPHTHALMOLOGY EXAM  06/05/2023   Medicare Annual Wellness (AWV)  09/17/2023   HEMOGLOBIN A1C  08/27/2023   Cervical Cancer Screening (HPV/Pap Cotest)  11/08/2023   Health Maintenance Items Addressed: Patient declines mammogram and colonoscopy.    Additional Screening:  Vision Screening: Recommended annual ophthalmology exams for early detection of glaucoma and other disorders of the eye. Overdue [facility] confirmed by phone call to the office   Dental Screening: Recommended annual dental exams for proper oral hygiene  Community Resource Referral / Chronic Care Management: CRR required this visit?  No   CCM required this visit?  No     Plan:     I have personally reviewed and noted the following in the patient's chart:   Medical and social history Use of alcohol, tobacco or illicit drugs  Current medications and supplements including opioid prescriptions. Patient is not currently taking opioid prescriptions. Functional ability and status Nutritional status Physical activity Advanced directives List of other physicians Hospitalizations, surgeries, and ER visits in previous 12 months Vitals Screenings to include cognitive, depression, and  falls Referrals and appointments  In addition, I have reviewed and discussed with patient certain preventive protocols, quality metrics, and best practice recommendations. A written personalized care plan for preventive services as well as general preventive health recommendations were provided to patient.     Sydell Axon, LPN   [id number]/9604   After Visit Summary: (Pick Up) Due to this being a telephonic visit, with patients personalized plan was offered to patient and patient has requested to Pick up at office.  Notes: Please refer to Routing Comments.

## 2023-09-29 ENCOUNTER — Other Ambulatory Visit: Payer: Self-pay | Admitting: Family Medicine

## 2023-09-29 DIAGNOSIS — E039 Hypothyroidism, unspecified: Secondary | ICD-10-CM

## 2023-10-02 ENCOUNTER — Ambulatory Visit: Admitting: Family Medicine

## 2023-10-20 ENCOUNTER — Encounter: Payer: Self-pay | Admitting: Family Medicine

## 2023-10-20 ENCOUNTER — Ambulatory Visit: Admitting: Family Medicine

## 2023-10-20 VITALS — BP 110/68 | HR 62 | Temp 98.2°F | Resp 20 | Ht 62.0 in | Wt 135.4 lb

## 2023-10-20 DIAGNOSIS — N1831 Chronic kidney disease, stage 3a: Secondary | ICD-10-CM

## 2023-10-20 DIAGNOSIS — E118 Type 2 diabetes mellitus with unspecified complications: Secondary | ICD-10-CM

## 2023-10-20 DIAGNOSIS — E559 Vitamin D deficiency, unspecified: Secondary | ICD-10-CM

## 2023-10-20 DIAGNOSIS — M797 Fibromyalgia: Secondary | ICD-10-CM

## 2023-10-20 DIAGNOSIS — Z1231 Encounter for screening mammogram for malignant neoplasm of breast: Secondary | ICD-10-CM

## 2023-10-20 DIAGNOSIS — E039 Hypothyroidism, unspecified: Secondary | ICD-10-CM | POA: Diagnosis not present

## 2023-10-20 DIAGNOSIS — G4700 Insomnia, unspecified: Secondary | ICD-10-CM

## 2023-10-20 DIAGNOSIS — E1159 Type 2 diabetes mellitus with other circulatory complications: Secondary | ICD-10-CM | POA: Diagnosis not present

## 2023-10-20 DIAGNOSIS — E538 Deficiency of other specified B group vitamins: Secondary | ICD-10-CM | POA: Diagnosis not present

## 2023-10-20 DIAGNOSIS — E1169 Type 2 diabetes mellitus with other specified complication: Secondary | ICD-10-CM | POA: Diagnosis not present

## 2023-10-20 DIAGNOSIS — E785 Hyperlipidemia, unspecified: Secondary | ICD-10-CM | POA: Diagnosis not present

## 2023-10-20 DIAGNOSIS — Z1211 Encounter for screening for malignant neoplasm of colon: Secondary | ICD-10-CM

## 2023-10-20 DIAGNOSIS — I152 Hypertension secondary to endocrine disorders: Secondary | ICD-10-CM

## 2023-10-20 DIAGNOSIS — G250 Essential tremor: Secondary | ICD-10-CM | POA: Diagnosis not present

## 2023-10-20 DIAGNOSIS — F39 Unspecified mood [affective] disorder: Secondary | ICD-10-CM

## 2023-10-20 DIAGNOSIS — F439 Reaction to severe stress, unspecified: Secondary | ICD-10-CM

## 2023-10-20 LAB — LIPID PANEL
Cholesterol: 149 mg/dL (ref 0–200)
HDL: 47.9 mg/dL (ref >39.00)
LDL Cholesterol: 82 mg/dL (ref 0–99)
NonHDL: 101.58
Total CHOL/HDL Ratio: 3
Triglycerides: 100 mg/dL (ref 0.0–149.0)
VLDL: 20 mg/dL (ref 0.0–40.0)

## 2023-10-20 LAB — CBC WITH DIFFERENTIAL/PLATELET
Basophils Absolute: 0 10*3/uL (ref 0.0–0.1)
Basophils Relative: 0.4 % (ref 0.0–3.0)
Eosinophils Absolute: 0.2 10*3/uL (ref 0.0–0.7)
Eosinophils Relative: 2 % (ref 0.0–5.0)
HCT: 43.7 % (ref 36.0–46.0)
Hemoglobin: 14.4 g/dL (ref 12.0–15.0)
Lymphocytes Relative: 25.3 % (ref 12.0–46.0)
Lymphs Abs: 2.5 10*3/uL (ref 0.7–4.0)
MCHC: 33 g/dL (ref 30.0–36.0)
MCV: 93.4 fl (ref 78.0–100.0)
Monocytes Absolute: 0.6 10*3/uL (ref 0.1–1.0)
Monocytes Relative: 6 % (ref 3.0–12.0)
Neutro Abs: 6.5 10*3/uL (ref 1.4–7.7)
Neutrophils Relative %: 66.3 % (ref 43.0–77.0)
Platelets: 359 10*3/uL (ref 150.0–400.0)
RBC: 4.67 Mil/uL (ref 3.87–5.11)
RDW: 13 % (ref 11.5–15.5)
WBC: 9.8 10*3/uL (ref 4.0–10.5)

## 2023-10-20 LAB — VITAMIN D 25 HYDROXY (VIT D DEFICIENCY, FRACTURES): VITD: 71.64 ng/mL (ref 30.00–100.00)

## 2023-10-20 LAB — COMPREHENSIVE METABOLIC PANEL WITH GFR
ALT: 23 U/L (ref 0–35)
AST: 20 U/L (ref 0–37)
Albumin: 4.4 g/dL (ref 3.5–5.2)
Alkaline Phosphatase: 72 U/L (ref 39–117)
BUN: 11 mg/dL (ref 6–23)
CO2: 26 meq/L (ref 19–32)
Calcium: 9.9 mg/dL (ref 8.4–10.5)
Chloride: 104 meq/L (ref 96–112)
Creatinine, Ser: 1.24 mg/dL — ABNORMAL HIGH (ref 0.40–1.20)
GFR: 49.99 mL/min — ABNORMAL LOW (ref >60.00)
Glucose, Bld: 104 mg/dL — ABNORMAL HIGH (ref 70–99)
Potassium: 4.6 meq/L (ref 3.5–5.1)
Sodium: 138 meq/L (ref 135–145)
Total Bilirubin: 0.6 mg/dL (ref 0.2–1.2)
Total Protein: 7.2 g/dL (ref 6.0–8.3)

## 2023-10-20 LAB — POCT GLYCOSYLATED HEMOGLOBIN (HGB A1C): Hemoglobin A1C: 5.6 % (ref 4.0–5.6)

## 2023-10-20 LAB — TSH: TSH: 19.71 u[IU]/mL — ABNORMAL HIGH (ref 0.35–5.50)

## 2023-10-20 LAB — VITAMIN B12: Vitamin B-12: 185 pg/mL — ABNORMAL LOW (ref 211–911)

## 2023-10-20 MED ORDER — PROPRANOLOL HCL 10 MG PO TABS: 10.0000 mg | ORAL_TABLET | Freq: Two times a day (BID) | ORAL | 1 refills | Status: DC

## 2023-10-20 MED ORDER — ATORVASTATIN CALCIUM 20 MG PO TABS: 20.0000 mg | ORAL_TABLET | Freq: Every day | ORAL | 3 refills | Status: DC

## 2023-10-20 MED ORDER — JANUMET XR 50-1000 MG PO TB24: ORAL_TABLET | ORAL | 3 refills | Status: DC

## 2023-10-20 MED ORDER — GABAPENTIN 300 MG PO CAPS: ORAL_CAPSULE | ORAL | 3 refills | Status: DC

## 2023-10-20 MED ORDER — FARXIGA 10 MG PO TABS: 10.0000 mg | ORAL_TABLET | Freq: Every morning | ORAL | 3 refills | Status: DC

## 2023-10-20 MED ORDER — TRAZODONE HCL 50 MG PO TABS: 25.0000 mg | ORAL_TABLET | Freq: Every evening | ORAL | 2 refills | Status: AC | PRN

## 2023-10-20 NOTE — Patient Instructions (Addendum)
 It was a pleasure meeting you today. Thank you for allowing me to take part in your health care.  Our goals for today as we discussed include:  We will get some labs today.  If they are abnormal or we need to do something about them, I will call you.  If they are normal, I will send you a message on MyChart (if it is active) or a letter in the mail.  If you don't hear from us  in 2 weeks, please call the office at the number below.   Referral sent to Psychologist for therapy  Referral sent for colonoscopy  Referral sent for Mammogram. Please call to schedule appointment. [facility] [address] [place], [place] [address] [phone]     Refills sent for requested medications   A1c 5.6.  good diabetes control  This is a list of the screening recommended for you and due dates:  Health Maintenance  Topic Date Due   Colon Cancer Screening  Never done   Mammogram  04/06/2023   Eye exam for diabetics  06/05/2023   Pap with HPV screening  11/08/2023   Flu Shot  01/23/2024   Yearly kidney function blood test for diabetes  02/27/2024   Yearly kidney health urinalysis for diabetes  02/27/2024   Complete foot exam   02/27/2024   Hemoglobin A1C  04/20/2024   Medicare Annual Wellness Visit  09/23/2024   DTaP/Tdap/Td vaccine (2 - Td or Tdap) 05/06/2028   Hepatitis C Screening  Completed   HIV Screening  Completed   HPV Vaccine  Aged Out   Meningitis B Vaccine  Aged Out   Pneumococcal Vaccination  Discontinued   COVID-19 Vaccine  Discontinued   Zoster (Shingles) Vaccine  Discontinued     If you have any questions or concerns, please do not hesitate to call the office at [phone].  I look forward to our next visit and until then take care and stay safe.  Regards,   Valli Gaw, MD   [facility]

## 2023-10-20 NOTE — Progress Notes (Signed)
 SUBJECTIVE:   Chief Complaint  Patient presents with   Medical Management of Chronic Issues    Follow up    HPI Presents for follow up chronic disease management  Discussed the use of AI scribe software for clinical note transcription with the patient, who gave verbal consent to proceed.  History of Present Illness Tracy Stephens is a 53 year old female with thyroid  dysfunction and diabetes who presents for follow-up and lab work.  She is undergoing follow-up for previously elevated thyroid  levels. Her medication was adjusted from a higher dose to 50 mcg of levothyroxine  due to significant weight loss. She is not experiencing headaches but frequently feels tired. Her thyroid  levels were initially low before increasing significantly, prompting the change in medication dosage.  Regarding her diabetes, she does not regularly monitor her blood sugar levels at home and is unsure of her current A1c. Her diabetes was worsening at the last check-up. She is currently taking Farxiga  10 mg and Janumet  50/1000 mg once daily at night and has stopped taking metformin  twice a day.  She reports a weight gain of about 5 to 7 pounds, which she is content with, as she feels healthier. She has not consumed soda for a year, which she considers a positive change.  She is attempting to reduce her use of trazodone , taking it every other day or every three days, due to sleep difficulties related to situational stress. She has a significant supply of trazodone  at home and is currently taking half of a 50 mg tablet. She is also taking propranolol  twice a day for night sweats, gabapentin  300 mg, Singulair  10 mg, and multivitamins. She reports issues with her pharmacy filling discontinued medications, such as cholesterol medication and thyroid  medication, despite her requests to stop.  She describes experiencing a recent panic attack characterized by chest tightness and difficulty breathing, lasting a couple of  minutes. She attributes this to stress related to her daughter's situation and her own feelings of depression. She has not been sleeping well due to her daughter's needs and her own disrupted schedule.  She has not completed her mammogram, colonoscopy, or Pap smear this year, citing disruptions in her routine and transportation issues. She acknowledges feeling depressed and attributes it to being stuck at home and dietary changes during the winter.    PERTINENT PMH / PSH: As above  OBJECTIVE:  BP 110/68   Pulse 62   Temp 98.2 F (36.8 C)   Resp 20   Ht 5\' 2"  (1.575 m)   Wt 135 lb 6 oz (61.4 kg)   LMP 01/07/2018   SpO2 98%   BMI 24.76 kg/m    Physical Exam Vitals reviewed.  Constitutional:      General: She is not in acute distress.    Appearance: Normal appearance. She is normal weight. She is not ill-appearing, toxic-appearing or diaphoretic.  Eyes:     General:        Right eye: No discharge.        Left eye: No discharge.     Conjunctiva/sclera: Conjunctivae normal.  Cardiovascular:     Rate and Rhythm: Normal rate and regular rhythm.     Heart sounds: Normal heart sounds.  Pulmonary:     Effort: Pulmonary effort is normal.     Breath sounds: Normal breath sounds.  Abdominal:     General: Bowel sounds are normal.  Musculoskeletal:        General: Normal range of motion.  Skin:  General: Skin is warm and dry.  Neurological:     General: No focal deficit present.     Mental Status: She is alert and oriented to person, place, and time. Mental status is at baseline.  Psychiatric:        Attention and Perception: Attention normal.        Mood and Affect: Mood normal. Affect is tearful.        Behavior: Behavior normal.        Thought Content: Thought content normal.        Judgment: Judgment normal.           10/20/2023   10:14 AM 09/24/2023    1:56 PM 02/27/2023   11:29 AM 09/17/2022    4:12 PM 08/27/2022   10:02 AM  Depression screen PHQ 2/9  Decreased  Interest 1 0 1 0 0  Down, Depressed, Hopeless 1 2 1  0 0  PHQ - 2 Score 2 2 2  0 0  Altered sleeping 1 3 2     Tired, decreased energy 1 1 1     Change in appetite 0 1 1    Feeling bad or failure about yourself  0 0 1    Trouble concentrating 0 0 1    Moving slowly or fidgety/restless 0 0 0    Suicidal thoughts 0 0 0    PHQ-9 Score 4 7 8     Difficult doing work/chores Somewhat difficult Somewhat difficult Somewhat difficult        10/20/2023   10:15 AM 02/27/2023   11:29 AM 08/27/2022   10:02 AM 01/07/2020    9:44 AM  GAD 7 : Generalized Anxiety Score  Nervous, Anxious, on Edge 1 1 0 1  Control/stop worrying 0 1 0 1  Worry too much - different things 1 1 0 1  Trouble relaxing 1 1 0 1  Restless 1 1 0 1  Easily annoyed or irritable 1 1 0 1  Afraid - awful might happen 0 1 0 0  Total GAD 7 Score 5 7 0 6  Anxiety Difficulty Somewhat difficult Not difficult at all Not difficult at all Somewhat difficult    ASSESSMENT/PLAN:  Type 2 diabetes mellitus with complications [facility]) Assessment & Plan: Chronic. A1c 5.6 Refill Farxiga  for CKD Refill Janumet  50-1000 mg daily On statin therapy BP well controlled but would consider low dose ARB in future       Orders: -     POCT glycosylated hemoglobin (Hb A1C) -     CBC with Differential/Platelet -     Comprehensive metabolic panel with GFR -     Janumet  XR; TAKE 1 TABLET BY MOUTH DAILY.  Dispense: 90 tablet; Refill: 3  Colon cancer screening -     Ambulatory referral to Gastroenterology -     Propranolol  HCl; Take 1 tablet (10 mg total) by mouth 2 (two) times daily.  Dispense: 180 tablet; Refill: 1  Hypertension associated with diabetes (HCC) Assessment & Plan: Chronic. Well controlled Refill Propranolol  10 mg BID Monitor BP at home, goal less than <130/80.    Orders: -     Comprehensive metabolic panel with GFR  Hypothyroidism, unspecified type Assessment & Plan: Chronic.  Elevated TSH.  Levothyroxine  decreased from 112 mcg to  50 mcg. Was not able to have follow up labs. Check TSH May need to adjust dose pending level  Addendum TSH improved but remains elevated Increase Levothyroxine  75 mcg daily Repeat TSH in 6 weeks  Orders: -     TSH  Hyperlipidemia associated with type 2 diabetes mellitus (HCC) -     Lipid panel -     Atorvastatin  Calcium ; Take 1 tablet (20 mg total) by mouth daily.  Dispense: 90 tablet; Refill: 3  Vitamin B 12 deficiency -     Vitamin B12  Vitamin D  deficiency -     VITAMIN D  25 Hydroxy (Vit-D Deficiency, Fractures)  Stress at home -     Ambulatory referral to Psychology  Breast cancer screening by mammogram -     3D Screening Mammogram, Left and Right  Insomnia, unspecified type Assessment & Plan: Chronic.  Refill Trazodone  to 25 mg nightly   Orders: -     traZODone  HCl; Take 0.5-1 tablets (25-50 mg total) by mouth at bedtime as needed. for sleep  Dispense: 90 tablet; Refill: 2  Essential tremor Assessment & Plan: Chronic. Refill Gabapentin  300 mg at night Refill Propranolol  10 mg BID  Orders: -     Gabapentin ; TAKE 1 CAPSULE BY MOUTH EVERYDAY AT BEDTIME  Dispense: 90 capsule; Refill: 3 -     Propranolol  HCl; Take 1 tablet (10 mg total) by mouth 2 (two) times daily.  Dispense: 180 tablet; Refill: 1  Fibromyalgia -     Gabapentin ; TAKE 1 CAPSULE BY MOUTH EVERYDAY AT BEDTIME  Dispense: 90 capsule; Refill: 3  Stage 3a chronic kidney disease (CKD) (HCC) Assessment & Plan: Chronic. Refill Farxiga  10 mg daily Follows with Nephrology   Orders: -     Farxiga ; Take 1 tablet (10 mg total) by mouth every morning.  Dispense: 90 tablet; Refill: 3  Mood disorder (HCC) Assessment & Plan: Prefers therapy over medication for situational stress-related depression. - Discuss therapy as a first-line treatment for depression. - Referral to psychology, [facility]   Orders: -     Ambulatory referral to Psychology     PDMP reviewed  Return if symptoms worsen or fail to  improve, for PCP.  Valli Gaw, MD

## 2023-10-21 ENCOUNTER — Telehealth: Payer: Self-pay

## 2023-10-21 NOTE — Telephone Encounter (Signed)
 During triage call for scheduling colonoscopy-pt stated that she is not able to stop Farxiga  prior to her colonoscopy because she takes it for her kidney function.  She said she will ask her kidney doctor prior to scheduling because she has stage 3 kidney failure.  Asked her to call me back to let me know once she has checked with her physician.  Thanks, [place], [place]

## 2023-10-24 ENCOUNTER — Telehealth: Payer: Self-pay

## 2023-10-24 ENCOUNTER — Other Ambulatory Visit: Payer: Self-pay | Admitting: Family Medicine

## 2023-10-24 DIAGNOSIS — E039 Hypothyroidism, unspecified: Secondary | ICD-10-CM

## 2023-10-24 DIAGNOSIS — E538 Deficiency of other specified B group vitamins: Secondary | ICD-10-CM

## 2023-10-24 MED ORDER — LEVOTHYROXINE SODIUM 75 MCG PO TABS: 75.0000 ug | ORAL_TABLET | Freq: Every day | ORAL | 3 refills | Status: DC

## 2023-10-24 MED ORDER — VITAMIN B-12 1000 MCG SL SUBL: 1000.0000 ug | SUBLINGUAL_TABLET | Freq: Every day | SUBLINGUAL | 3 refills | Status: AC

## 2023-10-24 NOTE — Telephone Encounter (Signed)
 Copied from CRM [id number]. Topic: Clinical - Lab/Test Results >> Oct 24, 2023 12:48 PM [place] M wrote: Reason for CRM: Spoke to patient and relayed lab results, Will call when she can schedule for labs

## 2023-10-24 NOTE — Telephone Encounter (Signed)
 Copied from CRM [id number]. Topic: Clinical - Lab/Test Results >> Oct 24, 2023 10:06 AM Tracy Stephens wrote: Reason for CRM: THE PT IS CALLING FOR HER LAB RESULTS BUT THE PROVIDER HAS NOT SEEN THE RECORDS YET. PT STATED SHE IS NEEDING THOSE RESULTS.

## 2023-10-24 NOTE — Telephone Encounter (Signed)
-----   Message from Valli Gaw sent at 10/24/2023 12:21 PM EDT ----- Stop Levothyroxine  50 mcg and start Levothyroxine  to 75 mcg daily.  Thyroid  is high. Repeat TSH in 6 weeks. Please schedule lab appointment  Kidney function remains unchanged. Follow up with kidney doctor   Vitamin B 12 low.  Start Vitamin B 12 1000 mcg daily.  Recommend dissolvable or liquid form.  Can be purchased over the counter.

## 2023-10-24 NOTE — Telephone Encounter (Signed)
 Left message to return call to our office.  Okay to relay message to pt and schedule lab appointment. Please document when pt is spoke to.

## 2023-10-26 ENCOUNTER — Encounter: Payer: Self-pay | Admitting: Family Medicine

## 2023-10-26 DIAGNOSIS — F39 Unspecified mood [affective] disorder: Secondary | ICD-10-CM | POA: Insufficient documentation

## 2023-10-26 NOTE — Assessment & Plan Note (Signed)
Chronic. Refill Gabapentin 300 mg at night Refill Propranolol 10 mg BID

## 2023-10-26 NOTE — Assessment & Plan Note (Signed)
 Chronic.  Refill Trazodone  to 25 mg nightly

## 2023-10-26 NOTE — Assessment & Plan Note (Signed)
 Chronic. Refill Farxiga  10 mg daily Follows with Nephrology

## 2023-10-26 NOTE — Assessment & Plan Note (Signed)
 Chronic. A1c 5.6 Refill Farxiga  for CKD Refill Janumet  50-1000 mg daily On statin therapy BP well controlled but would consider low dose ARB in future

## 2023-10-26 NOTE — Assessment & Plan Note (Signed)
 Prefers therapy over medication for situational stress-related depression. - Discuss therapy as a first-line treatment for depression. - Referral to psychology, [facility]

## 2023-10-26 NOTE — Assessment & Plan Note (Signed)
 Chronic.  Elevated TSH.  Levothyroxine  decreased from 112 mcg to 50 mcg. Was not able to have follow up labs. Check TSH May need to adjust dose pending level  Addendum TSH improved but remains elevated Increase Levothyroxine  75 mcg daily Repeat TSH in 6 weeks

## 2023-10-26 NOTE — Assessment & Plan Note (Signed)
 Chronic. Well controlled Refill Propranolol  10 mg BID Monitor BP at home, goal less than <130/80.

## 2023-11-11 ENCOUNTER — Encounter: Payer: Self-pay | Admitting: *Deleted

## 2023-11-13 ENCOUNTER — Other Ambulatory Visit: Payer: Self-pay | Admitting: Family Medicine

## 2023-11-13 DIAGNOSIS — F439 Reaction to severe stress, unspecified: Secondary | ICD-10-CM

## 2023-12-16 DIAGNOSIS — G250 Essential tremor: Secondary | ICD-10-CM | POA: Diagnosis not present

## 2023-12-16 DIAGNOSIS — I10 Essential (primary) hypertension: Secondary | ICD-10-CM | POA: Diagnosis not present

## 2023-12-16 DIAGNOSIS — E785 Hyperlipidemia, unspecified: Secondary | ICD-10-CM | POA: Diagnosis not present

## 2023-12-16 DIAGNOSIS — E1122 Type 2 diabetes mellitus with diabetic chronic kidney disease: Secondary | ICD-10-CM | POA: Diagnosis not present

## 2023-12-16 DIAGNOSIS — N1831 Chronic kidney disease, stage 3a: Secondary | ICD-10-CM | POA: Diagnosis not present

## 2023-12-24 ENCOUNTER — Other Ambulatory Visit

## 2023-12-24 DIAGNOSIS — E039 Hypothyroidism, unspecified: Secondary | ICD-10-CM

## 2023-12-25 LAB — TSH: TSH: 1.12 u[IU]/mL (ref 0.35–5.50)

## 2023-12-28 ENCOUNTER — Ambulatory Visit: Payer: Self-pay | Admitting: Family

## 2024-01-01 NOTE — Telephone Encounter (Signed)
 Copied from CRM [id number]. Topic: Clinical - Lab/Test Results >> Jan 01, 2024 10:58 AM Lavanda D wrote: Reason for CRM: Patient is requesting the results for her TSH levels.

## 2024-01-15 ENCOUNTER — Telehealth: Payer: Self-pay | Admitting: Family Medicine

## 2024-01-15 NOTE — Telephone Encounter (Signed)
 Left message to return call to our office.

## 2024-01-15 NOTE — Telephone Encounter (Signed)
 Called [facility] and they stated that they have the referral, but there Is a wait list. They did state that they would put her on the wait list but it might be November before they can get to her.

## 2024-01-15 NOTE — Telephone Encounter (Signed)
 Pt called back and E2C2 read the pt message.

## 2024-01-15 NOTE — Telephone Encounter (Signed)
 Copied from CRM [id number]. Topic: Referral - Question >> Jan 15, 2024  8:48 AM Mesmerise C wrote: Reason for CRM: Patient stated a referral was supposed to be sent to [address] address instead since her daughter goes there was told they would call the office and advise where the referral should go but haven't they can be reached at [phone]

## 2024-01-16 ENCOUNTER — Telehealth: Payer: Self-pay

## 2024-01-16 NOTE — Telephone Encounter (Signed)
 Copied from CRM [id number]. Topic: Referral - Request for Referral >> Jan 16, 2024  2:42 PM Thersia BROCKS wrote: Did the patient discuss referral with their provider in the last year? Yes (If No - schedule appointment) (If Yes - send message)  Appointment offered? Yes  Type of order/referral and detailed reason for visit: [facility]   Preference of office, provider, location:  [facility] [address] [place], [place] [address]  If referral order, have you been seen by this specialty before? Yes (If Yes, this issue or another issue? When? Where?  Can we respond through MyChart? No

## 2024-01-19 ENCOUNTER — Other Ambulatory Visit: Payer: Self-pay

## 2024-01-19 DIAGNOSIS — Z1231 Encounter for screening mammogram for malignant neoplasm of breast: Secondary | ICD-10-CM

## 2024-01-19 NOTE — Telephone Encounter (Signed)
 Order placed will fax to office once provider sign off on it.

## 2024-01-19 NOTE — Telephone Encounter (Signed)
 Order faxed over to [facility]

## 2024-01-19 NOTE — Telephone Encounter (Signed)
 Handed to Dr. Onesimo CMA to get signed.

## 2024-01-23 ENCOUNTER — Other Ambulatory Visit: Payer: Self-pay

## 2024-01-23 DIAGNOSIS — J309 Allergic rhinitis, unspecified: Secondary | ICD-10-CM

## 2024-01-23 MED ORDER — MONTELUKAST SODIUM 10 MG PO TABS: 10.0000 mg | ORAL_TABLET | Freq: Every day | ORAL | 1 refills | Status: AC

## 2024-01-26 DIAGNOSIS — Z1231 Encounter for screening mammogram for malignant neoplasm of breast: Secondary | ICD-10-CM | POA: Diagnosis not present

## 2024-01-27 NOTE — Telephone Encounter (Unsigned)
 Copied from CRM #[id number]. Topic: Appointments - Scheduling Inquiry for Clinic >> Jan 27, 2024 10:57 AM [place] E wrote: Reason for CRM: Patient called stating she received a message about scheduling a mammogram for her right breast, she previously had her left one done, but now needs the right.

## 2024-01-27 NOTE — Telephone Encounter (Unsigned)
 Copied from CRM #[id number]. Topic: Appointments - Scheduling Inquiry for Clinic >> Jan 27, 2024 10:57 AM [place] E wrote: Reason for CRM: Patient called stating she received a message about scheduling a mammogram for her right breast, she previously had her left one done, but now needs the right. >> Jan 27, 2024  2:47 PM Deleta S wrote: Patient would like for the office to send over a diagnose mammogram to the first specialist she seen back in October of 2023. Please contact the patient regarding request at [phone]

## 2024-01-28 ENCOUNTER — Telehealth: Payer: Self-pay

## 2024-01-28 ENCOUNTER — Other Ambulatory Visit: Payer: Self-pay

## 2024-01-28 ENCOUNTER — Other Ambulatory Visit: Payer: Self-pay | Admitting: Internal Medicine

## 2024-01-28 DIAGNOSIS — N6489 Other specified disorders of breast: Secondary | ICD-10-CM

## 2024-01-28 DIAGNOSIS — Z1231 Encounter for screening mammogram for malignant neoplasm of breast: Secondary | ICD-10-CM

## 2024-01-28 NOTE — Telephone Encounter (Signed)
 Orders have been placed.

## 2024-01-28 NOTE — Telephone Encounter (Signed)
 Pt notified. Informed pt to reach out to [place] to see if she needs other orders. Pt verbalized understanding

## 2024-01-28 NOTE — Telephone Encounter (Signed)
 Copied from CRM [id number]. Topic: Clinical - Medical Advice >> Jan 28, 2024 10:58 AM Harlene ORN wrote: Reason for CRM: Please call back the patient. She needs a referral for a mammogram. She says it's the place in [place]. Phone for the [facility] in [place]: [phone]

## 2024-01-28 NOTE — Telephone Encounter (Signed)
 Sending to doc of day for appoval.  Pt is due for a Mammogram was Dr. Hope pt. Order pended for approval

## 2024-01-30 ENCOUNTER — Other Ambulatory Visit: Payer: Self-pay | Admitting: *Deleted

## 2024-01-30 ENCOUNTER — Inpatient Hospital Stay
Admission: RE | Admit: 2024-01-30 | Discharge: 2024-01-30 | Disposition: A | Discharge status: Home / Self Care | Payer: Self-pay | Source: Ambulatory Visit | Attending: Internal Medicine | Admitting: Internal Medicine

## 2024-01-30 DIAGNOSIS — Z1231 Encounter for screening mammogram for malignant neoplasm of breast: Secondary | ICD-10-CM

## 2024-02-04 ENCOUNTER — Ambulatory Visit
Admission: RE | Admit: 2024-02-04 | Discharge: 2024-02-04 | Disposition: A | Discharge status: Home / Self Care | Source: Ambulatory Visit | Attending: Internal Medicine | Admitting: Internal Medicine

## 2024-02-04 DIAGNOSIS — N6489 Other specified disorders of breast: Secondary | ICD-10-CM | POA: Insufficient documentation

## 2024-05-26 ENCOUNTER — Telehealth: Payer: Self-pay

## 2024-05-26 NOTE — Telephone Encounter (Signed)
 Sent text, MyChart message and left VM to let pt know TOC visit needs to be rescheduled from 06/22/24.  E2C2, please reschedule TOC with pt if they call back -kh

## 2024-06-01 ENCOUNTER — Encounter: Payer: Self-pay | Admitting: Nurse Practitioner

## 2024-06-01 ENCOUNTER — Ambulatory Visit: Admitting: Nurse Practitioner

## 2024-06-01 VITALS — BP 124/82 | HR 67 | Temp 98.8°F | Ht 62.0 in | Wt 143.2 lb

## 2024-06-01 DIAGNOSIS — E785 Hyperlipidemia, unspecified: Secondary | ICD-10-CM | POA: Diagnosis not present

## 2024-06-01 DIAGNOSIS — N1831 Chronic kidney disease, stage 3a: Secondary | ICD-10-CM | POA: Diagnosis not present

## 2024-06-01 DIAGNOSIS — Z7984 Long term (current) use of oral hypoglycemic drugs: Secondary | ICD-10-CM | POA: Diagnosis not present

## 2024-06-01 DIAGNOSIS — E039 Hypothyroidism, unspecified: Secondary | ICD-10-CM | POA: Diagnosis not present

## 2024-06-01 DIAGNOSIS — F39 Unspecified mood [affective] disorder: Secondary | ICD-10-CM

## 2024-06-01 DIAGNOSIS — E1122 Type 2 diabetes mellitus with diabetic chronic kidney disease: Secondary | ICD-10-CM | POA: Diagnosis not present

## 2024-06-01 DIAGNOSIS — E118 Type 2 diabetes mellitus with unspecified complications: Secondary | ICD-10-CM

## 2024-06-01 DIAGNOSIS — G250 Essential tremor: Secondary | ICD-10-CM

## 2024-06-01 DIAGNOSIS — K760 Fatty (change of) liver, not elsewhere classified: Secondary | ICD-10-CM

## 2024-06-01 NOTE — Assessment & Plan Note (Signed)
 SABRA

## 2024-06-01 NOTE — Assessment & Plan Note (Signed)
 Tracy Stephens

## 2024-06-01 NOTE — Progress Notes (Unsigned)
 Established Patient Office Visit  Subjective:  Patient ID: Tracy Stephens, female    DOB: 11-09-1970  Age: 53 y.o. MRN: [id number]  CC: No chief complaint on file.  Discussed the use of AI scribe software for clinical note transcription with the patient, who gave verbal consent to proceed.  History of Present Illness     Past Medical History:  Diagnosis Date  . Allergic conjunctivitis 02/04/2018  . Allergy   . Annual physical exam 04/25/2021  . Anxiety   . Anxiety and depression 03/31/2019  . Benign essential tremor    right side  . Bilateral hand pain 08/11/2018  . Complication of anesthesia    complications with epidural in 2002  . Depression   . Early satiety 08/21/2016  . Encounter for medication monitoring 02/12/2016  . Fibromyalgia   . Herniated lumbar intervertebral disc   . History of kidney stones   . Hyperlipidemia   . Hypertension    history of blood pressure  . Hypothyroidism   . Irregular menses 05/09/2016  . Need for immunization against influenza 04/20/2015  . Other chronic pain 07/08/2018  . Ovarian cyst 08/21/2016   One septated cyst, one simple cyst both on left ovary Feb 2018  . Preventative health care 08/15/2017  . Primary writing tremor 03/04/2016  . Psoriasis   . Shoulder pain, right 05/06/2018  . Thyroid  disease   . Tremor of right hand 05/11/2018  . Type 2 diabetes mellitus (HCC) 11/29/2015  . Vitamin D  deficiency 11/01/2015  . Weakness of both hands 11/12/2018    Past Surgical History:  Procedure Laterality Date  . BREAST BIOPSY Left 01/19/2021   left breast stereo distortion benign excision done  . BREAST BIOPSY WITH RADIO FREQUENCY LOCALIZER Left 02/14/2021   Procedure: LEFT BREAST EXCISIONAL BIOPSY WITH RADIO FREQUENCY LOCALIZER;  Surgeon: Rodolph Romano, MD;  Location: [facility] ORS;  Service: General;  Laterality: Left;  . CESAREAN SECTION     2002/2006  . scar tissue removed     after 1 st c section  . TUBAL LIGATION       Family History  Problem Relation Age of Onset  . Cancer Mother        lung not smoker   . Alzheimer's disease Father   . Dementia Father   . Parkinson's disease Father   . Drug abuse Sister   . Thyroid  disease Sister   . Thyroid  disease Sister   . Cancer Sister        cervical  . Asthma Sister   . Depression Sister   . Hyperlipidemia Sister   . Thyroid  disease Sister   . Depression Sister   . Thyroid  disease Sister   . Depression Sister   . Asthma Sister   . Arthritis Sister   . Kidney disease Sister   . Breast cancer Maternal Aunt 40  . Cancer Maternal Grandmother   . Asthma Maternal Grandmother   . Cancer Maternal Grandfather   . Asthma Maternal Grandfather   . Cancer Paternal Grandmother        ?type  . Cancer Paternal Grandfather        ?type  . Early death Brother        childbirth  . Cancer Other        strong FH breast cancer 6/12 maternal relatives per pt and also colon cnacer  . Cancer Other        brain    Social History   Socioeconomic History  .  Marital status: Single    Spouse name: Not on file  . Number of children: 2  . Years of education: Not on file  . Highest education level: Not on file  Occupational History  . Not on file  Tobacco Use  . Smoking status: Never  . Smokeless tobacco: Never  Vaping Use  . Vaping status: Never Used  Substance and Sexual Activity  . Alcohol use: No    Alcohol/week: 0.0 standard drinks of alcohol  . Drug use: No  . Sexual activity: Never  Other Topics Concern  . Not on file  Social History Narrative   Disabled    Some college    Owns guns   Wears seat belt, safe in relationship    Single 2 kids    Social Drivers of Health   Financial Resource Strain: Low Risk  (09/24/2023)   Overall Financial Resource Strain (CARDIA)   . Difficulty of Paying Living Expenses: Not hard at all  Food Insecurity: No Food Insecurity (09/24/2023)   Hunger Vital Sign   . Worried About Programme Researcher, Broadcasting/film/video in the Last  Year: Never true   . Ran Out of Food in the Last Year: Never true  Transportation Needs: No Transportation Needs (09/24/2023)   PRAPARE - Transportation   . Lack of Transportation (Medical): No   . Lack of Transportation (Non-Medical): No  Physical Activity: Sufficiently Active (09/24/2023)   Exercise Vital Sign   . Days of Exercise per Week: 6 days   . Minutes of Exercise per Session: 30 min  Stress: Stress Concern Present (09/24/2023)   [employer] of Occupational Health - Occupational Stress Questionnaire   . Feeling of Stress : To some extent  Social Connections: Socially Isolated (09/24/2023)   Social Connection and Isolation Panel   . Frequency of Communication with Friends and Family: More than three times a week   . Frequency of Social Gatherings with Friends and Family: Twice a week   . Attends Religious Services: Never   . Active Member of Clubs or Organizations: No   . Attends Banker Meetings: Never   . Marital Status: Divorced  Catering Manager Violence: Not At Risk (09/24/2023)   Humiliation, Afraid, Rape, and Kick questionnaire   . Fear of Current or Ex-Partner: No   . Emotionally Abused: No   . Physically Abused: No   . Sexually Abused: No     Outpatient Medications Prior to Visit  Medication Sig Dispense Refill  . atorvastatin  (LIPITOR) 20 MG tablet Take 1 tablet (20 mg total) by mouth daily. 90 tablet 3  . Cholecalciferol 50 MCG (2000 UT) TABS Take 2,000 Units by mouth at bedtime.    . Cyanocobalamin  (VITAMIN B-12) 1000 MCG SUBL Place 1 tablet (1,000 mcg total) under the tongue daily. 90 tablet 3  . FARXIGA  10 MG TABS tablet Take 1 tablet (10 mg total) by mouth every morning. 90 tablet 3  . gabapentin  (NEURONTIN ) 300 MG capsule TAKE 1 CAPSULE BY MOUTH EVERYDAY AT BEDTIME 90 capsule 3  . levothyroxine  (SYNTHROID ) 75 MCG tablet Take 1 tablet (75 mcg total) by mouth daily. 90 tablet 3  . montelukast  (SINGULAIR ) 10 MG tablet Take 1 tablet (10 mg total) by  mouth at bedtime. For allergies 90 tablet 1  . Multiple Vitamins-Minerals (MULTIVITAMIN WITH MINERALS) tablet Take 1 tablet by mouth at bedtime. [place]    . propranolol  (INDERAL ) 10 MG tablet Take 1 tablet (10 mg total) by mouth 2 (two) times daily.  180 tablet 1  . SitaGLIPtin-MetFORMIN  HCl (JANUMET  XR) 50-1000 MG TB24 TAKE 1 TABLET BY MOUTH DAILY. 90 tablet 3  . traZODone  (DESYREL ) 50 MG tablet Take 0.5-1 tablets (25-50 mg total) by mouth at bedtime as needed. for sleep 90 tablet 2   No facility-administered medications prior to visit.    Allergies  Allergen Reactions  . Lyrica [Pregabalin] Other (See Comments)    Patient reports of facial tingling Patient reports of facial tingling  . Cymbalta [Duloxetine Hcl]     HTN, feels nothing doesn't care   . Influenza Vaccines Other (See Comments)    Patient states she gets very sick and passes out a few days after the shot.   SABRA Morley Flavoring Agent (Non-Screening) Itching    ROS Review of Systems Negative unless indicated in HPI.    Objective:    Physical Exam  BP 124/82   Pulse 67   Temp 98.8 F (37.1 C)   Ht 5' 2 (1.575 m)   Wt 143 lb 3.2 oz (65 kg)   LMP 01/07/2018   SpO2 94%   BMI 26.19 kg/m  Wt Readings from Last 3 Encounters:  06/01/24 143 lb 3.2 oz (65 kg)  10/20/23 135 lb 6 oz (61.4 kg)  09/24/23 140 lb (63.5 kg)     Health Maintenance  Topic Date Due  . Pneumococcal Vaccine: 50+ Years (1 of 2 - PCV) Never done  . Hepatitis B Vaccines 19-59 Average Risk (1 of 3 - 19+ 3-dose series) Never done  . Colonoscopy  Never done  . Diabetic kidney evaluation - Urine ACR  03/19/2023  . Mammogram  04/06/2023  . OPHTHALMOLOGY EXAM  06/05/2023  . Cervical Cancer Screening (HPV/Pap Cotest)  11/08/2023  . FOOT EXAM  02/27/2024  . HEMOGLOBIN A1C  04/20/2024  . Influenza Vaccine  09/21/2024 (Originally 01/23/2024)  . Medicare Annual Wellness (AWV)  09/23/2024  . Diabetic kidney evaluation - eGFR measurement   10/19/2024  . DTaP/Tdap/Td (2 - Td or Tdap) 05/06/2028  . Hepatitis C Screening  Completed  . HIV Screening  Completed  . HPV VACCINES  Aged Out  . Meningococcal B Vaccine  Aged Out  . COVID-19 Vaccine  Discontinued  . Zoster Vaccines- Shingrix  Discontinued       Topic Date Due  . Hepatitis B Vaccines 19-59 Average Risk (1 of 3 - 19+ 3-dose series) Never done    Lab Results  Component Value Date   TSH 1.12 12/24/2023   Lab Results  Component Value Date   WBC 9.8 10/20/2023   HGB 14.4 10/20/2023   HCT 43.7 10/20/2023   MCV 93.4 10/20/2023   PLT 359.0 10/20/2023   Lab Results  Component Value Date   NA 138 10/20/2023   K 4.6 10/20/2023   CO2 26 10/20/2023   GLUCOSE 104 (H) 10/20/2023   BUN 11 10/20/2023   CREATININE 1.24 (H) 10/20/2023   BILITOT 0.6 10/20/2023   ALKPHOS 72 10/20/2023   AST 20 10/20/2023   ALT 23 10/20/2023   PROT 7.2 10/20/2023   ALBUMIN 4.4 10/20/2023   CALCIUM  9.9 10/20/2023   GFR 49.99 (L) 10/20/2023   Lab Results  Component Value Date   CHOL 149 10/20/2023   Lab Results  Component Value Date   HDL 47.90 10/20/2023   Lab Results  Component Value Date   LDLCALC 82 10/20/2023   Lab Results  Component Value Date   TRIG 100.0 10/20/2023   Lab Results  Component Value Date  CHOLHDL 3 10/20/2023   Lab Results  Component Value Date   HGBA1C 5.6 10/20/2023      Assessment & Plan:   Assessment & Plan Type 2 diabetes mellitus with complications (HCC)     Hypothyroidism, unspecified type     Essential tremor     Fatty liver     Hyperlipidemia, unspecified hyperlipidemia type     Mood disorder     Stage 3a chronic kidney disease (CKD) (HCC)      Assessment and Plan Assessment & Plan       Follow-up: No follow-ups on file.   Tykisha Areola, NP

## 2024-06-02 LAB — CBC WITH DIFFERENTIAL/PLATELET
Basophils Absolute: 0.1 K/uL (ref 0.0–0.1)
Basophils Relative: 0.5 % (ref 0.0–3.0)
Eosinophils Absolute: 0.2 K/uL (ref 0.0–0.7)
Eosinophils Relative: 2.1 % (ref 0.0–5.0)
HCT: 44.7 % (ref 36.0–46.0)
Hemoglobin: 14.6 g/dL (ref 12.0–15.0)
Lymphocytes Relative: 25.1 % (ref 12.0–46.0)
Lymphs Abs: 2.5 K/uL (ref 0.7–4.0)
MCHC: 32.8 g/dL (ref 30.0–36.0)
MCV: 92.4 fl (ref 78.0–100.0)
Monocytes Absolute: 0.6 K/uL (ref 0.1–1.0)
Monocytes Relative: 6.3 % (ref 3.0–12.0)
Neutro Abs: 6.6 K/uL (ref 1.4–7.7)
Neutrophils Relative %: 66 % (ref 43.0–77.0)
Platelets: 398 K/uL (ref 150.0–400.0)
RBC: 4.84 Mil/uL (ref 3.87–5.11)
RDW: 13.4 % (ref 11.5–15.5)
WBC: 10 K/uL (ref 4.0–10.5)

## 2024-06-02 LAB — COMPREHENSIVE METABOLIC PANEL WITH GFR
ALT: 34 U/L (ref 0–35)
AST: 30 U/L (ref 0–37)
Albumin: 4.5 g/dL (ref 3.5–5.2)
Alkaline Phosphatase: 93 U/L (ref 39–117)
BUN: 13 mg/dL (ref 6–23)
CO2: 27 meq/L (ref 19–32)
Calcium: 10.3 mg/dL (ref 8.4–10.5)
Chloride: 105 meq/L (ref 96–112)
Creatinine, Ser: 1.11 mg/dL (ref 0.40–1.20)
GFR: 56.85 mL/min — ABNORMAL LOW (ref >60.00)
Glucose, Bld: 104 mg/dL — ABNORMAL HIGH (ref 70–99)
Potassium: 4.6 meq/L (ref 3.5–5.1)
Sodium: 140 meq/L (ref 135–145)
Total Bilirubin: 0.5 mg/dL (ref 0.2–1.2)
Total Protein: 7.6 g/dL (ref 6.0–8.3)

## 2024-06-02 LAB — LIPID PANEL
Cholesterol: 175 mg/dL (ref 0–200)
HDL: 58.2 mg/dL (ref >39.00)
LDL Cholesterol: 98 mg/dL (ref 0–99)
NonHDL: 116.62
Total CHOL/HDL Ratio: 3
Triglycerides: 91 mg/dL (ref 0.0–149.0)
VLDL: 18.2 mg/dL (ref 0.0–40.0)

## 2024-06-02 LAB — MICROALBUMIN / CREATININE URINE RATIO
Creatinine,U: 93.7 mg/dL
Microalb Creat Ratio: UNDETERMINED mg/g (ref 0.0–30.0)
Microalb, Ur: <0.7 mg/dL

## 2024-06-02 LAB — HEMOGLOBIN A1C: Hgb A1c MFr Bld: 6.3 % (ref 4.6–6.5)

## 2024-06-02 LAB — TSH: TSH: 1.61 u[IU]/mL (ref 0.35–5.50)

## 2024-06-03 ENCOUNTER — Other Ambulatory Visit: Payer: Self-pay | Admitting: Nurse Practitioner

## 2024-06-03 ENCOUNTER — Ambulatory Visit: Payer: Self-pay | Admitting: Nurse Practitioner

## 2024-06-03 DIAGNOSIS — E039 Hypothyroidism, unspecified: Secondary | ICD-10-CM

## 2024-06-03 DIAGNOSIS — Z1211 Encounter for screening for malignant neoplasm of colon: Secondary | ICD-10-CM

## 2024-06-03 DIAGNOSIS — E118 Type 2 diabetes mellitus with unspecified complications: Secondary | ICD-10-CM

## 2024-06-03 DIAGNOSIS — M797 Fibromyalgia: Secondary | ICD-10-CM

## 2024-06-03 DIAGNOSIS — G250 Essential tremor: Secondary | ICD-10-CM

## 2024-06-03 DIAGNOSIS — N1831 Chronic kidney disease, stage 3a: Secondary | ICD-10-CM

## 2024-06-03 DIAGNOSIS — E1169 Type 2 diabetes mellitus with other specified complication: Secondary | ICD-10-CM

## 2024-06-03 MED ORDER — PROPRANOLOL HCL 10 MG PO TABS: 10.0000 mg | ORAL_TABLET | Freq: Two times a day (BID) | ORAL | 1 refills | Status: AC

## 2024-06-03 MED ORDER — FARXIGA 10 MG PO TABS: 10.0000 mg | ORAL_TABLET | Freq: Every morning | ORAL | 3 refills | Status: AC

## 2024-06-03 MED ORDER — JANUMET XR 50-1000 MG PO TB24: ORAL_TABLET | ORAL | 3 refills | Status: AC

## 2024-06-03 MED ORDER — ATORVASTATIN CALCIUM 20 MG PO TABS: 20.0000 mg | ORAL_TABLET | Freq: Every day | ORAL | 3 refills | Status: AC

## 2024-06-03 MED ORDER — LEVOTHYROXINE SODIUM 75 MCG PO TABS: 75.0000 ug | ORAL_TABLET | Freq: Every day | ORAL | 3 refills | Status: AC

## 2024-06-03 MED ORDER — GABAPENTIN 300 MG PO CAPS: ORAL_CAPSULE | ORAL | 3 refills | Status: AC

## 2024-06-03 NOTE — Progress Notes (Signed)
 Noted

## 2024-06-03 NOTE — Progress Notes (Signed)
Controlled substance database reviewed.  Medication sent to pharmacy.

## 2024-06-03 NOTE — Progress Notes (Signed)
 Please call patient and inform:  Her blood work shows no sign of anemia. Electrolytes, liver function, cholesterol and thyroid  normal.  Hemoglobin A1c is stable at 6.3. Kidney functions improved from 49 to 56 compared to the previous lab.   I have refilled all her medication without any change in the dosage.

## 2024-06-22 ENCOUNTER — Encounter

## 2024-07-06 ENCOUNTER — Ambulatory Visit: Payer: Self-pay

## 2024-07-13 ENCOUNTER — Ambulatory Visit (INDEPENDENT_AMBULATORY_CARE_PROVIDER_SITE_OTHER): Payer: Self-pay

## 2024-07-13 DIAGNOSIS — F439 Reaction to severe stress, unspecified: Secondary | ICD-10-CM | POA: Insufficient documentation

## 2024-07-13 DIAGNOSIS — F331 Major depressive disorder, recurrent, moderate: Secondary | ICD-10-CM | POA: Insufficient documentation

## 2024-07-13 DIAGNOSIS — F411 Generalized anxiety disorder: Secondary | ICD-10-CM | POA: Insufficient documentation
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
## 2024-07-23 NOTE — Progress Notes (Unsigned)
 Clinician attempted session via telehealth, but Tracy Stephens did not join. Cln sent the patient a text message and an email during the appointment with the link for the visit as an additional reminder. Cln waited 15 minutes after appointment for start but patient never appeared.   Curtiss RAYMOND Carrie, [facility] 07/27/2024

## 2024-07-27 ENCOUNTER — Ambulatory Visit

## 2024-07-27 DIAGNOSIS — F331 Major depressive disorder, recurrent, moderate: Secondary | ICD-10-CM

## 2024-07-27 DIAGNOSIS — F411 Generalized anxiety disorder: Secondary | ICD-10-CM

## 2024-07-27 DIAGNOSIS — F439 Reaction to severe stress, unspecified: Secondary | ICD-10-CM

## 2024-07-27 DIAGNOSIS — Z91199 Patient's noncompliance with other medical treatment and regimen due to unspecified reason: Secondary | ICD-10-CM | POA: Insufficient documentation

## 2024-08-03 ENCOUNTER — Ambulatory Visit

## 2024-08-10 ENCOUNTER — Ambulatory Visit

## 2024-09-29 ENCOUNTER — Ambulatory Visit
